# Patient Record
Sex: Female | Born: 1985 | Race: Black or African American | Hispanic: No | Marital: Married | Smoking: Current some day smoker
Health system: Southern US, Community
[De-identification: ages and names within clinical notes are randomized; demographics above are authoritative.]

## PROBLEM LIST (undated history)

## (undated) ENCOUNTER — Inpatient Hospital Stay (HOSPITAL_COMMUNITY): Payer: Self-pay

## (undated) DIAGNOSIS — A549 Gonococcal infection, unspecified: Secondary | ICD-10-CM

## (undated) DIAGNOSIS — A599 Trichomoniasis, unspecified: Secondary | ICD-10-CM

## (undated) DIAGNOSIS — F32A Depression, unspecified: Secondary | ICD-10-CM

## (undated) DIAGNOSIS — J189 Pneumonia, unspecified organism: Secondary | ICD-10-CM

## (undated) DIAGNOSIS — F329 Major depressive disorder, single episode, unspecified: Secondary | ICD-10-CM

## (undated) DIAGNOSIS — N390 Urinary tract infection, site not specified: Secondary | ICD-10-CM

## (undated) DIAGNOSIS — D649 Anemia, unspecified: Secondary | ICD-10-CM

## (undated) DIAGNOSIS — R51 Headache: Secondary | ICD-10-CM

## (undated) DIAGNOSIS — J45909 Unspecified asthma, uncomplicated: Secondary | ICD-10-CM

## (undated) HISTORY — PX: WISDOM TOOTH EXTRACTION: SHX21

---
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
## 2007-05-27 ENCOUNTER — Emergency Department (HOSPITAL_COMMUNITY): Admission: EM | Admit: 2007-05-27 | Discharge: 2007-05-27 | Payer: Self-pay | Admitting: Family Medicine

## 2007-07-05 ENCOUNTER — Emergency Department (HOSPITAL_COMMUNITY): Admission: EM | Admit: 2007-07-05 | Discharge: 2007-07-05 | Payer: Self-pay | Admitting: Emergency Medicine

## 2009-08-25 ENCOUNTER — Emergency Department (HOSPITAL_COMMUNITY): Admission: EM | Admit: 2009-08-25 | Discharge: 2009-08-25 | Payer: Self-pay | Admitting: Emergency Medicine

## 2010-02-24 ENCOUNTER — Emergency Department (HOSPITAL_COMMUNITY): Admission: EM | Admit: 2010-02-24 | Discharge: 2010-02-24 | Payer: Self-pay | Admitting: Family Medicine

## 2010-10-15 ENCOUNTER — Emergency Department (HOSPITAL_COMMUNITY)
Admission: EM | Admit: 2010-10-15 | Discharge: 2010-10-15 | Payer: Self-pay | Source: Home / Self Care | Admitting: Emergency Medicine

## 2011-01-08 LAB — POCT I-STAT, CHEM 8
BUN: 9 mg/dL (ref 6–23)
Calcium, Ion: 1.17 mmol/L (ref 1.12–1.32)
Chloride: 107 mEq/L (ref 96–112)
Creatinine, Ser: 0.7 mg/dL (ref 0.4–1.2)
Glucose, Bld: 88 mg/dL (ref 70–99)
HCT: 31 % — ABNORMAL LOW (ref 36.0–46.0)
Hemoglobin: 10.5 g/dL — ABNORMAL LOW (ref 12.0–15.0)
Potassium: 4.1 mEq/L (ref 3.5–5.1)
Sodium: 142 mEq/L (ref 135–145)
TCO2: 25 mmol/L (ref 0–100)

## 2011-01-08 LAB — POCT PREGNANCY, URINE: Preg Test, Ur: NEGATIVE

## 2011-01-16 LAB — URINE CULTURE: Colony Count: >=100000

## 2011-01-16 LAB — POCT URINALYSIS DIP (DEVICE)
Glucose, UA: NEGATIVE mg/dL
Ketones, ur: 80 mg/dL — AB
Nitrite: POSITIVE — AB
Protein, ur: >=300 mg/dL — AB
Specific Gravity, Urine: 1.025 (ref 1.005–1.030)
Urobilinogen, UA: 2 mg/dL — ABNORMAL HIGH (ref 0.0–1.0)
pH: 6 (ref 5.0–8.0)

## 2011-01-16 LAB — POCT PREGNANCY, URINE: Preg Test, Ur: NEGATIVE

## 2011-02-01 LAB — URINALYSIS, ROUTINE W REFLEX MICROSCOPIC
Bilirubin Urine: NEGATIVE
Glucose, UA: NEGATIVE mg/dL
Hgb urine dipstick: NEGATIVE
Ketones, ur: 15 mg/dL — AB
Nitrite: NEGATIVE
Protein, ur: NEGATIVE mg/dL
Specific Gravity, Urine: 1.029 (ref 1.005–1.030)
Urobilinogen, UA: 1 mg/dL (ref 0.0–1.0)
pH: 7.5 (ref 5.0–8.0)

## 2011-02-01 LAB — URINE MICROSCOPIC-ADD ON

## 2011-02-01 LAB — POCT PREGNANCY, URINE: Preg Test, Ur: NEGATIVE

## 2011-02-26 ENCOUNTER — Inpatient Hospital Stay (INDEPENDENT_AMBULATORY_CARE_PROVIDER_SITE_OTHER)
Admission: RE | Admit: 2011-02-26 | Discharge: 2011-02-26 | Disposition: A | Discharge status: Home / Self Care | Payer: Medicaid Other | Source: Ambulatory Visit | Attending: Emergency Medicine | Admitting: Emergency Medicine

## 2011-02-26 DIAGNOSIS — J111 Influenza due to unidentified influenza virus with other respiratory manifestations: Secondary | ICD-10-CM

## 2011-03-16 NOTE — H&P (Signed)
Claire Riley, Claire Riley              ACCOUNT NO.:  [id number]   MEDICAL RECORD NO.:  [id number]          PATIENT TYPE:  EMS   LOCATION:  MAJO                         FACILITY:  [facility]   PHYSICIAN:  Elliot Cousin, M.D.    DATE OF BIRTH:  1985-12-08   DATE OF ADMISSION:  06/15/2006  DATE OF DISCHARGE:                                HISTORY & PHYSICAL   CHIEF COMPLAINT:  Pleuritic right sided chest pain and shortness of breath.   HISTORY OF PRESENT ILLNESS:  The patient is an 25 year old African American  lady with a past medical history significant for a recent delivery on June 03, 2006, and asthma, who presents to the emergency department with a 4-5 day  history of pleuritic right sided chest pain, shortness of breath, and  subjective fever and chills.  She rates the pain as a 7 to 8 over 10.  It  worsens with deep inspiration.  She took a combination of Tylenol and  ibuprofen to help with the pain.  They helped only a little. She went to her  primary care physician yesterday and was prescribed an antibiotic.  She has  had an intermittent productive cough with yellow and green sputum.  She says  her cough has been wet without expectoration.  She says that her temperature  reached as high as 104.7 at home yesterday.  She has had chills  intermittently over the past two days.  She also has an associated headache  globally but no neck stiffness, photophobia, or focal weakness.  No  associated history of nausea, vomiting, abdominal pain, diarrhea, or painful  urination.  She has had some mild vaginal spotting which she attributes to  her recent pregnancy and delivery.  She does not recall being exposed to  anyone with TB or pneumonia.  Of note, her infant son was hospitalized, as  well, for a breathing problem.   During the evaluation in the emergency department by emergency department  physician, Dr. Lynelle Doctor, the patient was noted to be afebrile and  hemodynamically stable.  She was,   however, mildly tachycardic.  The chest x-  ray revealed right lower lobe pneumonia.  A CT scan of the chest was ordered  to rule out PE primarily.  The CT scan of the chest revealed right lower  lobe pneumonia with a foci of necrosis and central cavitation.  It also  revealed other scattered nodules, no central PE, right hilar adenopathy,  mild bilateral supraclavicular and possible upper abdominal adenopathy.  The  patient will, therefore, be admitted for further evaluation and management.   PAST MEDICAL HISTORY:  1. Status post vaginal delivery on June 03, 2006.  The pregnancy was      uncomplicated per the patient, however, she did have to  undergo a      repair of the right sulcus tear and placement of a Penrose drain      following delivery.  2. Asthma.   MEDICATIONS:  1. Tylenol as needed.  2. Ibuprofen as needed.  3. Multi-vitamin with iron once daily.  4. Albuterol MDI 2 puffs as  needed.  5. Antibiotic (patient could not recall the name).   ALLERGIES:  No known drug allergies.   SOCIAL HISTORY:  The patient is single.  [place]  She has one child recently delivered.  She is  currently in the 11th grade.  She denies tobacco, alcohol, or drug use.   FAMILY HISTORY:  Her mother is 46 years of age and has hypertension.  Her  father is 93 years of age and has bronchitis.   REVIEW OF SYMPTOMS:  The patient's review of systems is as above in the  history of present illness.  In addition, she has had occasional mild  vaginal bleeding.   PHYSICAL EXAMINATION:  VITAL SIGNS:  Temperature 98.5, blood pressure 127/72, pulse 116,  respiratory rate 28, oxygen saturation 100% on 2 liters.  GENERAL:  The patient is a pleasant, obese 25 year old [place] American  woman who is currently sitting up in bed;  she does appear mildly dyspneic.  HEENT:  Head is normocephalic, atraumatic.  Pupils equal, round, reactive to  light.  Extraocular  movements intact.  Conjunctivae clear.  Sclerae white.  Tympanic membranes are clear bilaterally.  Nasal mucosa is mildly dry.  No  sinus tenderness.  Oropharynx reveals fair dentition.  Mucous membranes  mildly dry.  No posterior exudates or erythema.  NECK:  Supple, no adenopathy, no thyromegaly, no JVD, no bruits.  LUNGS:  Diffusely decreased breath sounds in the bases, particularly on the  right.  Occasional crackles of the right greater than left mid lobe.  The  patient is splinting.  She is mildly dyspneic when speaking.  She is not,  however, using  accessory muscles in excess.  HEART:  S1 and S2, tachycardia.  ABDOMEN:  Positive bowel sounds, soft, nontender, nondistended, no  hepatosplenomegaly, no masses palpated.  EXTREMITIES:  Pedal pulses are 2+ bilaterally.  No pretibial edema.  No  pedal edema.  No acute joint findings.  SKIN:  No obvious rash.  Turgor fairly good.   ADMISSION LABORATORY DATA:  EKG revealed sinus tachycardia with a heart rate  of 121 beats per minute.  Chest x-ray and CT of the chest findings are above  in the history of present illness.  Sodium 138, potassium 3.9, chloride 109,  BUN 10, glucose 107, bicarbonate 20, creatinine 0.7.  Myoglobin 45.8, CK MB  1.2, troponin I less than 0.05.  PT 13.7, INR 1.  WBC 11.5, hemoglobin 9.7,  hematocrit 30.1, MCV 71.6, platelets 310.   ASSESSMENT:  1. Right lower lobe pneumonia with evidence of focal necrosis/central      cavitation and surrounding adenopathy.  The etiology of the necrosis      and central cavitation is unclear.  Certainly tuberculosis and      sarcoidosis come to mind.  Given the patient's age, it appears to be      less likely tuberculosis, although it will certainly need to be ruled      out.  2. Status post pregnancy and vaginal delivery June 03, 2006.  3. Microcytic anemia.  This anemia is not unusual in an 25 year old     menstruating woman who was recently pregnant.  Hemoglobin 9.7 and  MCV      is 7.16.  4. Sinus tachycardia.  The tachycardia is secondary to volume depletion      and pneumonia.  5. History of asthma.  The patient uses Albuterol only occasionally.   PLAN:  1. The  patient will be admitted for further evaluation and management.  2. Blood cultures have been ordered.  The patient also received a gram of      Rocephin intravenously.  3. Will continue antibiotic treatment with Zosyn and azithromycin      intravenously.  Symptomatic treatment will be added with Albuterol and      Atrovent nebulizers.  Mucinex and Robitussin DM will be added, as well.  4. Will treat the patient's pleuritic pain with as needed Tylenol,      Oxycodone, and Dilaudid.  5. Will place the patient on respiratory isolation for now.  Will collect      sputums for gram stain and culture as well as for acid fast bacilli.  6. Consult pulmonary.  7. Will follow up on labs in the morning including iron studies,      angiotensin converting enzyme level, urine Legionella antigen, and a      TSH.  Will also placed a PPD.      Elliot Cousin, M.D.  Electronically Signed     DF/MEDQ  D:  06/15/2006  T:  06/15/2006  Job:  [id number]

## 2011-03-16 NOTE — Op Note (Signed)
[name], Claire Riley              ACCOUNT NO.:  [id number]   MEDICAL RECORD NO.:  [id number]          PATIENT TYPE:  INP   LOCATION:  9114                          FACILITY:  [facility]   PHYSICIAN:  Lesly Dukes, M.D. DATE OF BIRTH:  26-May-1986   DATE OF PROCEDURE:  06/03/2006  DATE OF DISCHARGE:                                 OPERATIVE REPORT   PREOPERATIVE DIAGNOSIS:  An 25 year old female with high right sulcus tear.   POSTOPERATIVE DIAGNOSIS:  An 25 year old female with high right sulcus tear.   PROCEDURE:  Repair of right sulcus tear and placement of Penrose drain.   SURGEON:  Dr. Elsie Lincoln.   ANESTHESIA:  General.   ESTIMATED BLOOD LOSS:  200 cc.   COMPLICATIONS:  None.   PROCEDURE:  After informed consent was obtained, patient was taken to the  operating room where general anesthesia was induced.  The patient was placed  in dorsal lithotomy position.  Foley was placed into the bladder.  Patient  was prepared and draped in normal sterile fashion.  Retractor was placed  into the vagina and there was good hemostasis up in the sulcus, except for  the vaginal epithelium with bleeding.  A 2-0 Vicryl was used to repair the  vaginal epithelium and a Penrose drain was placed laterally in the  paravaginal space.  This was placed in case the patient started to drain in  the paravaginal space.  The drain will be removed tomorrow.  Retractor was  removed from the vagina.  There was a small right labial tear that was  hemostatic and this was not repaired.  The patient tolerated the procedure  well.  Sponge, lap, instrument and needle count were correct times two.  The  patient to recovery room in stable condition.           ______________________________  Lesly Dukes, M.D.     KHL/MEDQ  D:  06/03/2006  T:  06/03/2006  Job:  [id number]

## 2011-03-16 NOTE — Discharge Summary (Signed)
NAMEAUBRIEE, SZETO              ACCOUNT NO.:  [id number]   MEDICAL RECORD NO.:  [id number]          PATIENT TYPE:  INP   LOCATION:  6702                         FACILITY:  [facility]   PHYSICIAN:  Lonia Blood, M.D.       DATE OF BIRTH:  05-Dec-1985   DATE OF ADMISSION:  06/15/2006  DATE OF DISCHARGE:  06/22/2006                                 DISCHARGE SUMMARY   PRIMARY CARE PHYSICIAN:  [facility]   DISCHARGE DIAGNOSES:  1. Right lower lobe cavitary pneumonia.  2. Recent vaginal delivery  3. Obesity.  4. Mild bronchial asthma   DISCHARGE MEDICATIONS:  1. Augmentin 175 mg by mouth twice a day for 2 weeks.  2. Tylenol as needed for pain.  3. Multivitamin one daily.   CONDITION ON DISCHARGE:  The patient was discharged in good condition. At  time of discharge, she was alert, oriented, afebrile with stable vital  signs, ambulating and tolerating a regular diet.  At the time of discharge,  the patient was instructed to follow up with the primary care physician at  the [facility] and to follow up with Dr. Levy Pupa with  [facility] for possible repeat CT scan of a chest to assure  resolution of her cavitary pneumonia.  Also, the patient was instructed that  in case she develops hemoptysis or fever, to report promptly back to the  emergency room.   CONSULTATION DURING THIS ADMISSION:  1. The patient was seen in consultation by Dr. Delton Coombes for pulmonary.  2. Dr. Wayland Salinas with infectious disease.   PROCEDURES DURING THIS ADMISSION:  1. June 15, 2006, the patient underwent computed tomography scan of the      chest that showed no pulmonary embolism, right lower lobe necrotic      pneumonia, superior segment right lower lobe and left lower lobe      nodular densities, right hilar mediastinal and bilateral suprapubic      clavicular adenopathy, small pleural effusions.  2. June 19, 2006, bronchoscopy by Dr. Delton Coombes showing no  identifiable      mass, anomalous distal right upper lobe takeoff, right upper lobe      bronchus at the level of the right middle which is a normal anatomical      variant.  3. Bronchoalveolar lavage was performed. Cultures are still pending.  4. June 19, 2006, computed tomography scan of abdomen and pelvis which      did not show any thrombosed pelvic veins.  5. Transthoracic echocardiogram which showed normal ejection fraction and      increased pulmonary artery pressures.   For admission History and Physical admission, refer to the dictated H&P done  on June 15, 2006, by Dr. Sherrie Mustache.  Briefly, Mrs. Borgwardt is an 25 year old  African American woman who presented to [facility] Emergency Room with  fever, chills and pain with deep inspiration.  She was diagnosed with  necrotizing cavitary pneumonia.  She was admitted to the hospital for  further workup and observation.   HOSPITAL COURSE.:  RIGHT LOWER LOBE CAVITARY  PNEUMONIA:  Mrs. Boeve was admitted to the  regular floor of [facility], and she was promptly started on  empiric antibiotics using vancomycin and Zosyn.  The patient had blood  cultures at the time of admission.  Her blood cultures remained negative.  The patient never produced any sputum, so we were not able to perform a  sputum culture.  The patient improved dramatically with empiric antibiotics,  but, because her pneumonia had atypical features, we have proceeded with a  bronchoalveolar lavage to look for AFB or fungi.  At the time of discharge,  all the cultures are pending, but none of them are growing anything at this  point in time.  Other possible etiologies that we entertained include  Wegener granulomatosis, but the ANCA panel was negative.  At this point in  time, since the patient had dramatic good response to antibiotics, and after  careful consultation with infectious disease specialist and the pulmonary  specialist, we have elected to proceed  with outpatient oral antibiotic  treatment for 2 weeks and then to repeat the CT scan of the chest to assure  resolution of the necrotic elements.  If the patient has worsening in her  status, she was instructed to come back immediately to the emergency room  for change in plans.  If the patient does not have a good response to oral  antibiotics at home, we were contemplating a video-assisted thoracoscopy  with removal of the necrotic mass.  This procedure was actually offered to  the patient and her family even before discharge, but they politely  declined.  At the time of discharge of the patient, the choice of  antibiotics was also dictated by the fact the patient wishes to be able to  breast feed her child.  The choice of antibiotic has been done in careful  consultation with Dr. Roxan Hockey from infectious disease.      Lonia Blood, M.D.  Electronically Signed     SL/MEDQ  D:  06/22/2006  T:  06/23/2006  Job:  [id number]   cc:   Leslye Peer, MD  [facility]

## 2011-03-16 NOTE — Op Note (Signed)
Claire Riley, Claire Riley              ACCOUNT NO.:  [id number]   MEDICAL RECORD NO.:  [id number]          PATIENT TYPE:  INP   LOCATION:  1610                         FACILITY:  [facility]   PHYSICIAN:  Leslye Peer, MD    DATE OF BIRTH:  1986-01-21   DATE OF PROCEDURE:  06/19/2006  DATE OF DISCHARGE:                                 OPERATIVE REPORT   PROCEDURE:  Fiberoptic bronchoscopy with bronchoalveolar lavage and  transbronchial biopsies.   OPERATOR:  Dr. Levy Pupa.   INDICATION:  Right lower lobe cavitary lesions.  Consent was obtained from  the patient and signed copy is on her hospital chart.   MEDICATIONS GIVEN:  1. Fentanyl 150 mcg IV in divided doses.  2. Versed 6 mg IV in divided doses.  3. 1% lidocaine to the bronchial tree, 40 mL total.  4. Cetacaine spray to the posterior pharynx.   PROCEDURE DETAILS:  Informed consent was obtained from the patient, as  indicated above, and then conscious sedation was initiated, as indicated  above.  The fiberoptic bronchoscope was initially introduced through the  right nares, but due to patient discomfort, was withdrawn and then  introduced through the oropharynx.  The cords were visualized and were  normal in appearance.  The trachea was intubated and local anesthesia was  achieved with 1% topical lidocaine.  The main carina was within normal  limits.  There was an anomalous distal right upper lobe takeoff and right  upper lobe bronchus at the level of the right middle lobe, which is a normal  anatomical variant.  There were no obvious endobronchial lesions or  secretions on a full right-sided exam.  The bronchoscope was withdrawn and  the left side was inspected.  The anatomy on the left was entirely normal  without any evidence of mass or secretions.  Transbronchial biopsies (N=7)  were obtained, under fluoroscopic guidance, from all segments of the right  lower lobe.  Bronchoalveolar lavage was then performed with 60 mL  normal  saline instilled and 10 mL returned.  The right lower lobe bronchoalveolar  lavage will be sent for microbiology and cytology.  The patient tolerated  the procedure well.  There was approximately 2-3 mL total blood loss and  there was good hemostasis at the end of procedure.  Postprocedural chest x-  ray is pending.  The patient returned to recovery room in good condition.   SAMPLES:  1. Transbronchial biopsies from the right lower lobe.  2. Bronchoalveolar lavage from the right lower lobe.   PLANS:  We will follow up Ms. Postell' pathology an microbiology results and  adjust our therapeutic plans accordingly.          ______________________________  Leslye Peer, MD    RSB/MEDQ  D:  06/19/2006  T:  06/19/2006  Job:  [id number]

## 2011-08-10 LAB — POCT RAPID STREP A: Streptococcus, Group A Screen (Direct): POSITIVE — AB

## 2011-08-18 ENCOUNTER — Encounter (HOSPITAL_COMMUNITY): Payer: Self-pay | Admitting: *Deleted

## 2011-08-18 ENCOUNTER — Inpatient Hospital Stay (HOSPITAL_COMMUNITY)
Admission: AD | Admit: 2011-08-18 | Discharge: 2011-08-19 | Disposition: A | Discharge status: Home / Self Care | Payer: Medicaid Other | Source: Ambulatory Visit | Attending: Obstetrics & Gynecology | Admitting: Obstetrics & Gynecology

## 2011-08-18 DIAGNOSIS — N946 Dysmenorrhea, unspecified: Secondary | ICD-10-CM

## 2011-08-18 DIAGNOSIS — M5380 Other specified dorsopathies, site unspecified: Secondary | ICD-10-CM

## 2011-08-18 DIAGNOSIS — M62830 Muscle spasm of back: Secondary | ICD-10-CM

## 2011-08-18 HISTORY — DX: Unspecified asthma, uncomplicated: J45.909

## 2011-08-18 HISTORY — DX: Urinary tract infection, site not specified: N39.0

## 2011-08-18 HISTORY — DX: Gonococcal infection, unspecified: A54.9

## 2011-08-18 LAB — CBC
HCT: 33.4 % — ABNORMAL LOW (ref 36.0–46.0)
MCV: 76.4 fL — ABNORMAL LOW (ref 78.0–100.0)
Platelets: 216 10*3/uL (ref 150–400)
RBC: 4.37 MIL/uL (ref 3.87–5.11)
RDW: 15.7 % — ABNORMAL HIGH (ref 11.5–15.5)
WBC: 6.7 10*3/uL (ref 4.0–10.5)

## 2011-08-18 LAB — COMPREHENSIVE METABOLIC PANEL
ALT: 8 U/L (ref 0–35)
AST: 12 U/L (ref 0–37)
Albumin: 4.1 g/dL (ref 3.5–5.2)
Alkaline Phosphatase: 60 U/L (ref 39–117)
CO2: 25 mEq/L (ref 19–32)
Chloride: 104 mEq/L (ref 96–112)
Creatinine, Ser: 0.74 mg/dL (ref 0.50–1.10)
GFR calc non Af Amer: >90 mL/min (ref >90)
Potassium: 3.6 mEq/L (ref 3.5–5.1)
Total Bilirubin: 0.3 mg/dL (ref 0.3–1.2)

## 2011-08-18 LAB — URINALYSIS, ROUTINE W REFLEX MICROSCOPIC
Glucose, UA: NEGATIVE mg/dL
Ketones, ur: 40 mg/dL — AB
Leukocytes, UA: NEGATIVE
Nitrite: NEGATIVE
Protein, ur: NEGATIVE mg/dL
Specific Gravity, Urine: >1.03 — ABNORMAL HIGH (ref 1.005–1.030)
Urobilinogen, UA: 4 mg/dL — ABNORMAL HIGH (ref 0.0–1.0)
pH: 6 (ref 5.0–8.0)

## 2011-08-18 LAB — URINE MICROSCOPIC-ADD ON

## 2011-08-18 LAB — POCT PREGNANCY, URINE: Preg Test, Ur: NEGATIVE

## 2011-08-18 MED ORDER — KETOROLAC TROMETHAMINE 60 MG/2ML IM SOLN
60.0000 mg | Freq: Once | INTRAMUSCULAR | Status: AC
  Administered 2011-08-18: 60 mg via INTRAMUSCULAR
  Filled 2011-08-18: qty 2

## 2011-08-18 NOTE — Progress Notes (Signed)
Pt states, " I 've had left mid and low back pain for two and then today I started having heavy vaginal bleeding. I filled up a large pad in 45 min. I've had low abdominal pain off and on for one month and it has been there all day."

## 2011-08-18 NOTE — ED Provider Notes (Signed)
History     Chief Complaint  Patient presents with  . Back Pain  . Vaginal Bleeding   HPI Claire Riley 25 y.o. Not pregnant.  Has had left sided back pain for 3 days which begins below shoulder blade and shoots down her back and radiates to right leg.  Has had heavy vaginal bleeding today and lower abdominal cramping.  Has 101 year old child that weighs 42 pounds and she carried her child today when walking to a relative's house.   OB History    Grav Para Term Preterm Abortions TAB SAB Ect Mult Living   1 1        1       Past Medical History  Diagnosis Date  . UTI (lower urinary tract infection)   . Bronchial asthma   . Gonorrhea     Past Surgical History  Procedure Date  . Wisdom tooth extraction     No family history on file.  History  Substance Use Topics  . Smoking status: Current Everyday Smoker -- 0.2 packs/day  . Smokeless tobacco: Not on file  . Alcohol Use: No    Allergies:  Allergies  Allergen Reactions  . Tomato Hives    Prescriptions prior to admission  Medication Sig Dispense Refill  . ibuprofen (ADVIL,MOTRIN) 200 MG tablet Take 200 mg by mouth every 6 (six) hours as needed. For pain         Review of Systems  Gastrointestinal: Positive for abdominal pain.  Genitourinary:       Vaginal bleeding  Musculoskeletal: Positive for back pain.   Physical Exam   Blood pressure 129/88, pulse 99, temperature 99.2 F (37.3 C), temperature source Oral, resp. rate 18, height 5\' 11"  (1.803 m), weight 195 lb 4 oz (88.565 kg), last menstrual period 06/22/2011.  Physical Exam  Nursing note and vitals reviewed. Constitutional: She is oriented to person, place, and time. She appears well-developed and well-nourished.  HENT:  Head: Normocephalic.  Eyes: EOM are normal.  Neck: Neck supple.  GI: Soft. There is no tenderness. There is no rebound and no guarding.  Musculoskeletal: Normal range of motion.  Neurological: She is alert and oriented to person,  place, and time.  Skin: Skin is warm and dry.  Psychiatric: She has a normal mood and affect.    MAU Course  Procedures Toradol 60 MG IM and client is now sleeping.  Awakened her for discharge.   MDM Results for orders placed during the hospital encounter of 08/18/11 (from the past 24 hour(s))  URINALYSIS, ROUTINE W REFLEX MICROSCOPIC     Status: Abnormal   Collection Time   08/18/11 10:30 PM      Component Value Range   Color, Urine YELLOW  YELLOW    Appearance CLEAR  CLEAR    Specific Gravity, Urine >1.030 (*) 1.005 - 1.030    pH 6.0  5.0 - 8.0    Glucose, UA NEGATIVE  NEGATIVE (mg/dL)   Hgb urine dipstick LARGE (*) NEGATIVE    Bilirubin Urine SMALL (*) NEGATIVE    Ketones, ur 40 (*) NEGATIVE (mg/dL)   Protein, ur NEGATIVE  NEGATIVE (mg/dL)   Urobilinogen, UA 4.0 (*) 0.0 - 1.0 (mg/dL)   Nitrite NEGATIVE  NEGATIVE    Leukocytes, UA NEGATIVE  NEGATIVE   URINE MICROSCOPIC-ADD ON     Status: Abnormal   Collection Time   08/18/11 10:30 PM      Component Value Range   Squamous Epithelial / LPF FEW (*)  RARE    WBC, UA 0-2  <3 (WBC/hpf)   RBC / HPF TOO NUMEROUS TO COUNT  <3 (RBC/hpf)   Bacteria, UA FEW (*) RARE    Urine-Other MUCOUS PRESENT    POCT PREGNANCY, URINE     Status: Normal   Collection Time   08/18/11 10:35 PM      Component Value Range   Preg Test, Ur NEGATIVE    CBC     Status: Abnormal   Collection Time   08/18/11 10:56 PM      Component Value Range   WBC 6.7  4.0 - 10.5 (K/uL)   RBC 4.37  3.87 - 5.11 (MIL/uL)   Hemoglobin 10.3 (*) 12.0 - 15.0 (g/dL)   HCT 40.9 (*) 81.1 - 46.0 (%)   MCV 76.4 (*) 78.0 - 100.0 (fL)   MCH 23.6 (*) 26.0 - 34.0 (pg)   MCHC 30.8  30.0 - 36.0 (g/dL)   RDW 91.4 (*) 78.2 - 15.5 (%)   Platelets 216  150 - 400 (K/uL)  COMPREHENSIVE METABOLIC PANEL     Status: Abnormal   Collection Time   08/18/11 10:56 PM      Component Value Range   Sodium 138  135 - 145 (mEq/L)   Potassium 3.6  3.5 - 5.1 (mEq/L)   Chloride 104  96 - 112  (mEq/L)   CO2 25  19 - 32 (mEq/L)   Glucose, Bld 100 (*) 70 - 99 (mg/dL)   BUN 12  6 - 23 (mg/dL)   Creatinine, Ser 9.56  0.50 - 1.10 (mg/dL)   Calcium 9.6  8.4 - 21.3 (mg/dL)   Total Protein 7.1  6.0 - 8.3 (g/dL)   Albumin 4.1  3.5 - 5.2 (g/dL)   AST 12  0 - 37 (U/L)   ALT 8  0 - 35 (U/L)   Alkaline Phosphatase 60  39 - 117 (U/L)   Total Bilirubin 0.3  0.3 - 1.2 (mg/dL)   GFR calc non Af Amer >90  >90 (mL/min)   GFR calc Af Amer >90  >90 (mL/min)     Assessment and Plan  Dysmenorrhea Back muscle spasm  Plan: Will prescribe Ibuprofen 800 mg po q 8 hours for pain Will prescribe flexeril for back spasms (has taken previously) Advised not to carry her child. [facility]. Take a multivitamin daily.  BURLESON,TERRI 08/18/2011, 11:03 PM   Nolene Bernheim, NP 08/19/11 0050

## 2011-08-18 NOTE — Progress Notes (Signed)
Pt states started bleeding heavily (1 large pad in 45 min), bleeding has slowed now.  C/o pain in lower back and abd.

## 2011-08-19 MED ORDER — CYCLOBENZAPRINE HCL 10 MG PO TABS: 10.0000 mg | ORAL_TABLET | Freq: Three times a day (TID) | ORAL | Status: AC | PRN

## 2011-08-19 MED ORDER — IBUPROFEN 800 MG PO TABS: 800.0000 mg | ORAL_TABLET | Freq: Three times a day (TID) | ORAL | Status: AC

## 2011-08-19 NOTE — ED Notes (Signed)
0045 T. Burleson NP in to see pt and discuss d/c plan.

## 2011-08-19 NOTE — Progress Notes (Signed)
Written and verbal d/c instructions given and understanding voiced. 

## 2011-08-19 NOTE — Progress Notes (Signed)
Pt denies any pain and is dozing

## 2011-08-21 NOTE — ED Provider Notes (Signed)
Attestation of Attending Supervision of Advanced Practitioner: Evaluation and management procedures were performed by the PA/NP/CNM/OB Fellow under my supervision/collaboration. Chart reviewed and agree with management and plan.  [name] A M.D. 08/21/2011 11:17 AM

## 2012-03-08 ENCOUNTER — Emergency Department (HOSPITAL_COMMUNITY)
Admission: EM | Admit: 2012-03-08 | Discharge: 2012-03-08 | Disposition: A | Discharge status: Home / Self Care | Payer: Medicaid Other | Source: Home / Self Care | Attending: Emergency Medicine | Admitting: Emergency Medicine

## 2012-03-08 ENCOUNTER — Encounter (HOSPITAL_COMMUNITY): Payer: Self-pay | Admitting: *Deleted

## 2012-03-08 DIAGNOSIS — M5416 Radiculopathy, lumbar region: Secondary | ICD-10-CM

## 2012-03-08 DIAGNOSIS — IMO0002 Reserved for concepts with insufficient information to code with codable children: Secondary | ICD-10-CM

## 2012-03-08 MED ORDER — MELOXICAM 15 MG PO TABS: 15.0000 mg | ORAL_TABLET | Freq: Every day | ORAL | Status: DC

## 2012-03-08 MED ORDER — CYCLOBENZAPRINE HCL 5 MG PO TABS: 5.0000 mg | ORAL_TABLET | Freq: Three times a day (TID) | ORAL | Status: AC | PRN

## 2012-03-08 MED ORDER — PREDNISONE 10 MG PO TABS: ORAL_TABLET | ORAL | Status: DC

## 2012-03-08 NOTE — Discharge Instructions (Signed)
Do exercises twice daily followed by moist heat for 15 minutes. ° ° ° ° ° °Try to be as active as possible. ° °If no better in 2 weeks, follow up with orthopedist. ° ° °

## 2012-03-08 NOTE — ED Provider Notes (Signed)
Chief Complaint  Patient presents with  . Leg Swelling    History of Present Illness:  The patient is a 26 year old female who has had a two-day history of right leg swelling and pain. The swelling is localized in the knee down to the ankle and the pain in the groin on down to the ankle. This came on after playing basketball than going to work at the [employer] the same evening. She also complains of numbness on the right inner thigh and posterior right ankle and heel. She denies any weakness, bladder, or bowel complaints. She has no swelling or pain in the left leg. She denies any abdominal pain, fever, or chills. She's had no shortness of breath, chest pain, or hemoptysis. No prior history of DVT or pulmonary emboli, or any other clotting problems either in herself or her family. No history of prolonged car trips or plane trips or trauma to the leg.  Review of Systems:  Other than noted above, the patient denies any of the following symptoms: Systemic:  No fever, chills, sweats, weight gain or loss. Respiratory:  No coughing, wheezing, or shortness of breath. Cardiac:  No chest pain, tightness, pressure or syncope. GI:  No abdominal pain, swelling, distension, nausea, or vomiting. GU:  No dysuria, frequency, or hematuria. Ext:  No joint pain, muscle pain, or weakness. Skin:  No rash or itching. Neuro:  No paresthesias.   PMFSH:  Past medical history, family history, social history, meds, and allergies were reviewed.  Physical Exam:   Vital signs:  BP 140/81  Pulse 94  Temp(Src) 99 F (37.2 C) (Oral)  Resp 18  SpO2 99%  LMP 02/12/2012 Gen:  Alert, oriented, in no distress. Neck:  No tenderness, adenopathy, or JVD. Lungs:  Breath sounds clear and equal bilaterally.  No rales, rhonchi or wheezes. Heart:  Regular rhythm, no gallops or murmers. Abdomen:  Soft, nontender, no organomegaly or mass. Ext:  There was no visible swelling of the right leg, no pitting edema, and calf  circumference was 44 cm bilaterally. She has pain to palpation over the entire leg from the groin area on down to the foot. All joints have full range of motion but she does have pain with movement of the hip, knee, and ankle. DTRs and muscle strength normal, sensation was normal, pulses were full, she has good capillary refill. Neuro:  Alert and oriented times 3.  No muscle weakness.  Sensation intact to light touch. Skin:  Warm and dry.  No rash or skin lesions.  Labs:   Results for orders placed during the hospital encounter of 03/08/12  D-DIMER, QUANTITATIVE      Component Value Range   D-Dimer, Quant 0.26  0.00 - 0.48 (ug/mL-FEU)     Assessment:  The encounter diagnosis was Lumbar radiculopathy. I think all of her symptoms are musculoskeletal in most likely due to overexertion. I gave her a note to remain out of work for about 3 days as well as the medications listed below.  Plan:   1.  The following meds were prescribed:   New Prescriptions   CYCLOBENZAPRINE (FLEXERIL) 5 MG TABLET    Take 1 tablet (5 mg total) by mouth 3 (three) times daily as needed for muscle spasms.   MELOXICAM (MOBIC) 15 MG TABLET    Take 1 tablet (15 mg total) by mouth daily.   PREDNISONE (DELTASONE) 10 MG TABLET    Take 4 tabs daily for 4 days, 3 tabs daily for 4 days,  2 tabs daily for 4 days, then 1 tab daily for 4 days.   2.  The patient was instructed in symptomatic care and handouts were given. 3.  The patient was told to return if becoming worse in any way, if no better in 3 or 4 days, and given some red flag symptoms that would indicate earlier return. If no better within 2 weeks I suggested she followup with Dr. Doneen Poisson.    Reuben Likes, MD 03/08/12 [phone]

## 2012-03-08 NOTE — ED Notes (Signed)
Reports playing basketball last Saturday, then going to work at [employer] the same evening; started to notice swelling and pain from right hip down to right ankle.  Continues w/ pain and swelling.  Also c/o numbness to right inner thigh and posterior right ankle/heel.  Has tried IBU 400mg , hot baths, and ice.

## 2012-06-17 ENCOUNTER — Inpatient Hospital Stay (HOSPITAL_COMMUNITY)
Admission: EM | Admit: 2012-06-17 | Discharge: 2012-06-19 | DRG: 690 | Disposition: A | Discharge status: Home / Self Care | Payer: MEDICAID | Attending: Internal Medicine | Admitting: Internal Medicine

## 2012-06-17 ENCOUNTER — Emergency Department (HOSPITAL_COMMUNITY): Payer: Self-pay

## 2012-06-17 ENCOUNTER — Encounter (HOSPITAL_COMMUNITY): Payer: Self-pay | Admitting: *Deleted

## 2012-06-17 DIAGNOSIS — R509 Fever, unspecified: Secondary | ICD-10-CM

## 2012-06-17 DIAGNOSIS — F17200 Nicotine dependence, unspecified, uncomplicated: Secondary | ICD-10-CM | POA: Diagnosis present

## 2012-06-17 DIAGNOSIS — R000 Tachycardia, unspecified: Secondary | ICD-10-CM | POA: Diagnosis present

## 2012-06-17 DIAGNOSIS — A498 Other bacterial infections of unspecified site: Secondary | ICD-10-CM | POA: Diagnosis present

## 2012-06-17 DIAGNOSIS — J40 Bronchitis, not specified as acute or chronic: Secondary | ICD-10-CM

## 2012-06-17 DIAGNOSIS — N390 Urinary tract infection, site not specified: Principal | ICD-10-CM | POA: Diagnosis present

## 2012-06-17 DIAGNOSIS — I959 Hypotension, unspecified: Secondary | ICD-10-CM | POA: Diagnosis present

## 2012-06-17 DIAGNOSIS — M546 Pain in thoracic spine: Secondary | ICD-10-CM

## 2012-06-17 DIAGNOSIS — J45909 Unspecified asthma, uncomplicated: Secondary | ICD-10-CM | POA: Diagnosis present

## 2012-06-17 HISTORY — DX: Pneumonia, unspecified organism: J18.9

## 2012-06-17 LAB — URINALYSIS, ROUTINE W REFLEX MICROSCOPIC
Bilirubin Urine: NEGATIVE
Glucose, UA: NEGATIVE mg/dL
Ketones, ur: 15 mg/dL — AB
Protein, ur: >300 mg/dL — AB
pH: 6.5 (ref 5.0–8.0)

## 2012-06-17 LAB — URINE MICROSCOPIC-ADD ON

## 2012-06-17 LAB — CBC WITH DIFFERENTIAL/PLATELET
Eosinophils Absolute: 0 10*3/uL (ref 0.0–0.7)
Hemoglobin: 11.7 g/dL — ABNORMAL LOW (ref 12.0–15.0)
Lymphs Abs: 0.5 10*3/uL — ABNORMAL LOW (ref 0.7–4.0)
MCH: 27.2 pg (ref 26.0–34.0)
Neutro Abs: 5.7 10*3/uL (ref 1.7–7.7)
Neutrophils Relative %: 85 % — ABNORMAL HIGH (ref 43–77)
Platelets: 140 10*3/uL — ABNORMAL LOW (ref 150–400)
RBC: 4.3 MIL/uL (ref 3.87–5.11)
WBC: 6.7 10*3/uL (ref 4.0–10.5)

## 2012-06-17 LAB — POCT I-STAT, CHEM 8
Calcium, Ion: 1.18 mmol/L (ref 1.12–1.23)
Chloride: 103 mEq/L (ref 96–112)
Creatinine, Ser: 0.9 mg/dL (ref 0.50–1.10)
Glucose, Bld: 100 mg/dL — ABNORMAL HIGH (ref 70–99)
Hemoglobin: 11.9 g/dL — ABNORMAL LOW (ref 12.0–15.0)
Potassium: 3.5 mEq/L (ref 3.5–5.1)

## 2012-06-17 LAB — CARDIAC PANEL(CRET KIN+CKTOT+MB+TROPI)
Total CK: 68 U/L (ref 7–177)
Troponin I: <0.3 ng/mL (ref <0.30)

## 2012-06-17 MED ORDER — SODIUM CHLORIDE 0.9 % IV BOLUS (SEPSIS)
1000.0000 mL | Freq: Once | INTRAVENOUS | Status: AC
  Administered 2012-06-17: 1000 mL via INTRAVENOUS

## 2012-06-17 MED ORDER — DEXTROSE 5 % IV SOLN
1.0000 g | Freq: Once | INTRAVENOUS | Status: AC
  Administered 2012-06-17: 1 g via INTRAVENOUS
  Filled 2012-06-17: qty 10

## 2012-06-17 MED ORDER — ACETAMINOPHEN 325 MG PO TABS
975.0000 mg | ORAL_TABLET | Freq: Once | ORAL | Status: AC
  Administered 2012-06-17: 975 mg via ORAL
  Filled 2012-06-17: qty 3

## 2012-06-17 MED ORDER — SODIUM CHLORIDE 0.9 % IV SOLN
INTRAVENOUS | Status: DC
  Administered 2012-06-17 – 2012-06-19 (×4): via INTRAVENOUS

## 2012-06-17 MED ORDER — MORPHINE SULFATE 4 MG/ML IJ SOLN
4.0000 mg | Freq: Once | INTRAMUSCULAR | Status: AC
  Administered 2012-06-17: 4 mg via INTRAVENOUS
  Filled 2012-06-17: qty 1

## 2012-06-17 MED ORDER — ONDANSETRON HCL 4 MG/2ML IJ SOLN
4.0000 mg | Freq: Four times a day (QID) | INTRAMUSCULAR | Status: DC | PRN
Start: 2012-06-17 — End: 2012-06-19

## 2012-06-17 MED ORDER — SODIUM CHLORIDE 0.9 % IJ SOLN: 3.0000 mL | Freq: Two times a day (BID) | INTRAMUSCULAR | Status: DC

## 2012-06-17 MED ORDER — CEFTRIAXONE SODIUM 1 G IJ SOLR
1.0000 g | INTRAMUSCULAR | Status: DC
  Administered 2012-06-18 (×2): 1 g via INTRAVENOUS
  Filled 2012-06-17 (×4): qty 10

## 2012-06-17 MED ORDER — ONDANSETRON HCL 4 MG/2ML IJ SOLN
4.0000 mg | Freq: Once | INTRAMUSCULAR | Status: AC
  Administered 2012-06-17: 4 mg via INTRAVENOUS
  Filled 2012-06-17: qty 2

## 2012-06-17 MED ORDER — SODIUM CHLORIDE 0.9 % IV SOLN: INTRAVENOUS | Status: DC

## 2012-06-17 MED ORDER — KETOROLAC TROMETHAMINE 30 MG/ML IJ SOLN
30.0000 mg | Freq: Once | INTRAMUSCULAR | Status: AC
  Administered 2012-06-17: 30 mg via INTRAVENOUS
  Filled 2012-06-17: qty 1

## 2012-06-17 MED ORDER — ALBUTEROL SULFATE (5 MG/ML) 0.5% IN NEBU
5.0000 mg | INHALATION_SOLUTION | Freq: Once | RESPIRATORY_TRACT | Status: AC
  Administered 2012-06-17: 5 mg via RESPIRATORY_TRACT
  Filled 2012-06-17: qty 40

## 2012-06-17 MED ORDER — MORPHINE SULFATE 4 MG/ML IJ SOLN
4.0000 mg | Freq: Once | INTRAMUSCULAR | Status: AC
  Administered 2012-06-17: 4 mg via INTRAVENOUS
  Filled 2012-06-17 (×2): qty 1

## 2012-06-17 MED ORDER — IPRATROPIUM BROMIDE 0.02 % IN SOLN
0.5000 mg | Freq: Once | RESPIRATORY_TRACT | Status: AC
  Administered 2012-06-17: 0.5 mg via RESPIRATORY_TRACT
  Filled 2012-06-17: qty 2.5

## 2012-06-17 NOTE — ED Notes (Signed)
Internal medicine at bedside

## 2012-06-17 NOTE — ED Provider Notes (Signed)
I saw and evaluated the patient, reviewed the resident's note and I agree with the findings and plan.  Aashi Derrington, MD 06/17/12 1531 

## 2012-06-17 NOTE — ED Notes (Signed)
Pt is here with 2 days gradual onset of pain to chest and back and hurts to breath.  PT reports freezing.  Pt reports coughing for one week now.  Pt is coughing up yellow sputum

## 2012-06-17 NOTE — ED Notes (Signed)
Pt. Was unable to eat dinner due to her pain , pt. Has been medicated and will reevaluate .

## 2012-06-17 NOTE — ED Provider Notes (Signed)
History     CSN: [id number]  Arrival date & time 06/17/12  4098   First MD Initiated Contact with Patient 06/17/12 1003      No chief complaint on file.   (Consider location/radiation/quality/duration/timing/severity/associated sxs/prior treatment) Patient is a 26 y.o. female presenting with URI and back pain. The history is provided by the patient.  URI The primary symptoms include cough. Primary symptoms do not include fever, wheezing, abdominal pain, nausea or vomiting. The current episode started 3 to 5 days ago. This is a new problem. The problem has been gradually worsening.  The cough is productive. The sputum is yellow.  Symptoms associated with the illness include chills, congestion and rhinorrhea. The illness is not associated with sinus pressure.  Back Pain  This is a new problem. The current episode started 2 days ago. The problem occurs constantly. The problem has been gradually worsening. The pain is associated with no known injury. The pain is present in the thoracic spine. The pain is at a severity of 9/10. The pain is severe. The symptoms are aggravated by certain positions (inspiration, cough). The pain is the same all the time. Associated symptoms include dysuria. Pertinent negatives include no chest pain, no fever, no numbness, no abdominal pain and no weakness.    Past Medical History  Diagnosis Date  . UTI (lower urinary tract infection)   . Bronchial asthma   . Gonorrhea     Past Surgical History  Procedure Date  . Wisdom tooth extraction     No family history on file.  History  Substance Use Topics  . Smoking status: Current Everyday Smoker -- 0.2 packs/day  . Smokeless tobacco: Not on file  . Alcohol Use: No    OB History    Grav Para Term Preterm Abortions TAB SAB Ect Mult Living   1 1        1       Review of Systems  Constitutional: Positive for chills. Negative for fever.  HENT: Positive for congestion and rhinorrhea. Negative for neck  pain, neck stiffness and sinus pressure.   Respiratory: Positive for cough. Negative for shortness of breath and wheezing.   Cardiovascular: Negative for chest pain and leg swelling.  Gastrointestinal: Positive for constipation. Negative for nausea, vomiting, abdominal pain and diarrhea.  Genitourinary: Positive for dysuria and frequency.  Musculoskeletal: Positive for back pain.  Neurological: Negative for weakness and numbness.  All other systems reviewed and are negative.    Allergies  Tomato  Home Medications   Current Outpatient Rx  Name Route Sig Dispense Refill  . ALBUTEROL IN Inhalation Inhale into the lungs.    . MELOXICAM 15 MG PO TABS Oral Take 1 tablet (15 mg total) by mouth daily. 15 tablet 0  . PREDNISONE 10 MG PO TABS  Take 4 tabs daily for 4 days, 3 tabs daily for 4 days, 2 tabs daily for 4 days, then 1 tab daily for 4 days. 40 tablet 0    BP 123/70  Pulse 112  Temp 99.8 F (37.7 C) (Oral)  Resp 18  SpO2 98%  LMP 06/08/2012  Physical Exam  Nursing note and vitals reviewed. Constitutional: She is oriented to person, place, and time. She appears well-developed and well-nourished. No distress.  HENT:  Head: Normocephalic and atraumatic.  Right Ear: External ear normal.  Left Ear: External ear normal.  Nose: Nose normal.  Mouth/Throat: Oropharynx is clear and moist.  Neck: Neck supple.  Cardiovascular: Regular rhythm, normal heart sounds  and intact distal pulses.  Tachycardia present.   Pulmonary/Chest: Effort normal and breath sounds normal. No respiratory distress. She has no wheezes. She has no rales. She exhibits no tenderness.  Abdominal: Soft. She exhibits no distension. There is no tenderness.  Musculoskeletal: She exhibits no edema.       Thoracic back: She exhibits tenderness. She exhibits no bony tenderness.       Back:  Lymphadenopathy:    She has no cervical adenopathy.  Neurological: She is alert and oriented to person, place, and time.    Skin: Skin is warm and dry. She is not diaphoretic.    ED Course  Procedures (including critical care time)  Labs Reviewed  URINALYSIS, ROUTINE W REFLEX MICROSCOPIC - Abnormal; Notable for the following:    Color, Urine AMBER (*)  BIOCHEMICALS MAY BE AFFECTED BY COLOR   APPearance TURBID (*)     Hgb urine dipstick LARGE (*)     Ketones, ur 15 (*)     Protein, ur >300 (*)     Urobilinogen, UA 2.0 (*)     Nitrite POSITIVE (*)     Leukocytes, UA LARGE (*)     All other components within normal limits  URINE MICROSCOPIC-ADD ON - Abnormal; Notable for the following:    Squamous Epithelial / LPF FEW (*)     Bacteria, UA MANY (*)     All other components within normal limits  POCT PREGNANCY, URINE  CBC WITH DIFFERENTIAL  URINE CULTURE   Dg Chest 2 View  06/17/2012  *RADIOLOGY REPORT*  Clinical Data: Coughing and right-sided chest pain for 2 days. Smoker and history of asthma.  CHEST - 2 VIEW  Comparison: 08/25/2009.  Findings: There are mild chronically accentuated bronchial markings.  There are no infiltrates or edematous changes.  The heart and mediastinal structures are normal.  There is no pneumothorax.  There is a small right cervical rib.  IMPRESSION: Mild stable chronic bronchitic changes.  No acute findings.   Original Report Authenticated By: Rolla Plate, M.D.      1. UTI (urinary tract infection)   2. Thoracic back pain   3. Bronchitis       MDM  26 yo female with URI symptoms and now productive cough with back pain, especially with inspiration and cough. No chest pain. Hurts to breathe but does not feel dyspneic. Patient's symtpoms significantly improved with albuterol and pain control. Re-examination still shows clear lung sounds and good air movement without wheezes. Urine positive for UTI, will send urine for culture and give rocephin. Back pain is higher than CVA, more c/w rib pain, likely from coughing. However will send CBC, BMP and continue giving fluids. Will  move to CDU and hydrate and give abx. If she improves and tachycardia resolves and BP is stable will d/c home. Otherwise will need admission.        Pricilla Loveless, MD 06/17/12 [phone]

## 2012-06-17 NOTE — ED Notes (Signed)
Pt. Having lower back pain into her lower abdominal area. Pain is a 10/10

## 2012-06-17 NOTE — ED Notes (Signed)
0.9% NS complete. 2nd liter. Pt reports "feeling warm". Fever as decreased. Denies pain. A.O. X 4. NAD.

## 2012-06-17 NOTE — ED Notes (Signed)
Patient is resting comfortably. 

## 2012-06-17 NOTE — H&P (Addendum)
[facility] History and Physical  Claire Riley JYN:[id number] DOB: 05-30-1986 DOA: 06/17/2012  Referring physician: Raeford Razor PCP: Zada Finders, NT   Chief Complaint: uti  HPI:  26 yo female with hx of uti, complains of dysuria, nausea, no emesis,  Bilateral flank pain right >left, and increase frequency of urination.   Her symptoms started on Sunday nite.  Subjective fever beginning today, but denies hematuria.  Pt was apparently tachycardic, and mildly hypotensive with sbp 90's,  Cardiac marker pending. ED felt uncomfortable sending her home and requests admission for observation.   Review of Systems:  Negative for all organ systems except for + above  Past Medical History  Diagnosis Date  . UTI (lower urinary tract infection)   . Bronchial asthma   . Gonorrhea   . Pneumonia    Past Surgical History  Procedure Date  . Wisdom tooth extraction    Social History:  reports that she has been smoking.  She does not have any smokeless tobacco history on file. She reports that she does not drink alcohol or use illicit drugs. Lives with grandmother , able to perform all ADLS Occupation concessions for the [employer]   Allergies  Allergen Reactions  . Tomato Hives    Family History  Problem Relation Age of Onset  . Hypertension Father     (be sure to complete)  Prior to Admission medications   Medication Sig Start Date End Date Taking? Authorizing Provider  ibuprofen (ADVIL,MOTRIN) 200 MG tablet Take 800 mg by mouth every 6 (six) hours as needed. For pain   Yes Historical Provider, MD   Physical Exam: Filed Vitals:   06/17/12 1800 06/17/12 1821 06/17/12 1936 06/17/12 2036  BP: 101/60 101/60 93/42 97/47   Pulse: 109 121 112 95  Temp:  101.4 F (38.6 C) 101.4 F (38.6 C) 99.3 F (37.4 C)  TempSrc:  Oral Oral Oral  Resp: 20 20 12 12   SpO2: 100% 96% 96% 97%     General:  No acute distress  Eyes: anicteric  ENT: mmm  Neck: no jvd, no  bruit, no tm, no adenopathy  Cardiovascular: rrr s1, s2  Respiratory: ctab  Abdomen: soft, nt, nd, +bs  Skin: no c/c/e  Musculoskeletal: wnl  Psychiatric: axox3  Neurologic: nonfocal, cn2-12 intact, reflexes 2+ symmetric, diffuse with downgoing toes bilaterally. Motor 5/5 in all 4 ext  Labs on Admission:  Basic Metabolic Panel:  Lab 06/17/12 8657  NA 139  K 3.5  CL 103  CO2 --  GLUCOSE 100*  BUN 10  CREATININE 0.90  CALCIUM --  MG --  PHOS --   Liver Function Tests: No results found for this basename: AST:5,ALT:5,ALKPHOS:5,BILITOT:5,PROT:5,ALBUMIN:5 in the last 168 hours No results found for this basename: LIPASE:5,AMYLASE:5 in the last 168 hours No results found for this basename: AMMONIA:5 in the last 168 hours CBC:  Lab 06/17/12 1548 06/17/12 1458  WBC -- 6.7  NEUTROABS -- 5.7  HGB 11.9* 11.7*  HCT 35.0* 35.2*  MCV -- 81.9  PLT -- 140*   Cardiac Enzymes:  Lab 06/17/12 2130  CKTOTAL 68  CKMB 1.2  CKMBINDEX --  TROPONINI <0.30    BNP (last 3 results) No results found for this basename: PROBNP:3 in the last 8760 hours CBG: No results found for this basename: GLUCAP:5 in the last 168 hours  Radiological Exams on Admission: Dg Chest 2 View  06/17/2012  *RADIOLOGY REPORT*  Clinical Data: Coughing and right-sided chest pain for 2 days. Smoker and  history of asthma.  CHEST - 2 VIEW  Comparison: 08/25/2009.  Findings: There are mild chronically accentuated bronchial markings.  There are no infiltrates or edematous changes.  The heart and mediastinal structures are normal.  There is no pneumothorax.  There is a small right cervical rib.  IMPRESSION: Mild stable chronic bronchitic changes.  No acute findings.   Original Report Authenticated By: Rolla Plate, M.D.     EKG:   Assessment/Plan Active Problems:  UTI (lower urinary tract infection)  Tachycardia  1. Uti: rocephin 1gm iv qday 2. Tachycardia likely secondary to fever, infection,  Resolved,   Hydrate with ns iv,  If persistent check tsh, cardiach echo , check 1set of cardiac markers 3. DVT prophylaxis scd  Code Status: Full Family Communication: Stormy Fabian [phone] Disposition Plan:  [place]  Time spent: 35 minutes  Pearson Grippe [facility] Pager [phone] If 7PM-7AM, please contact night-coverage [email or web address] Password [facility] 06/17/2012, 10:46 PM

## 2012-06-17 NOTE — ED Notes (Addendum)
Pt reports feeling hot. Skin warm, dry,  to the touch intact. NAD. Laying down in bed. 0.9% NS running 153ml/hr. Pt. Laying down in bed watching bed. Lights low for comfort measures. Denies pain. No further needs at this time. A.O. X 4.

## 2012-06-18 DIAGNOSIS — M546 Pain in thoracic spine: Secondary | ICD-10-CM

## 2012-06-18 DIAGNOSIS — R509 Fever, unspecified: Secondary | ICD-10-CM

## 2012-06-18 LAB — COMPREHENSIVE METABOLIC PANEL
BUN: 11 mg/dL (ref 6–23)
CO2: 23 mEq/L (ref 19–32)
Calcium: 8.2 mg/dL — ABNORMAL LOW (ref 8.4–10.5)
Chloride: 106 mEq/L (ref 96–112)
Creatinine, Ser: 0.87 mg/dL (ref 0.50–1.10)
GFR calc non Af Amer: >90 mL/min (ref >90)
Total Bilirubin: 2 mg/dL — ABNORMAL HIGH (ref 0.3–1.2)

## 2012-06-18 LAB — CBC
Hemoglobin: 11.2 g/dL — ABNORMAL LOW (ref 12.0–15.0)
Platelets: 139 10*3/uL — ABNORMAL LOW (ref 150–400)
RBC: 4.21 MIL/uL (ref 3.87–5.11)
WBC: 6.6 10*3/uL (ref 4.0–10.5)

## 2012-06-18 MED ORDER — PANTOPRAZOLE SODIUM 40 MG PO TBEC: 40.0000 mg | DELAYED_RELEASE_TABLET | Freq: Every day | ORAL | Status: DC

## 2012-06-18 MED ORDER — ACETAMINOPHEN 325 MG PO TABS
650.0000 mg | ORAL_TABLET | ORAL | Status: DC | PRN
  Administered 2012-06-18 (×2): 650 mg via ORAL
  Filled 2012-06-18 (×3): qty 2

## 2012-06-18 MED ORDER — MORPHINE SULFATE 4 MG/ML IJ SOLN: 4.0000 mg | INTRAMUSCULAR | Status: DC | PRN

## 2012-06-18 MED ORDER — IBUPROFEN 400 MG PO TABS
400.0000 mg | ORAL_TABLET | Freq: Three times a day (TID) | ORAL | Status: DC
  Administered 2012-06-18 – 2012-06-19 (×3): 400 mg via ORAL
  Filled 2012-06-18 (×6): qty 1

## 2012-06-18 MED ORDER — ENSURE COMPLETE PO LIQD
237.0000 mL | ORAL | Status: DC
  Administered 2012-06-18 – 2012-06-19 (×2): 237 mL via ORAL

## 2012-06-18 MED ORDER — OXYCODONE-ACETAMINOPHEN 5-325 MG PO TABS
1.0000 | ORAL_TABLET | Freq: Four times a day (QID) | ORAL | Status: DC | PRN
  Administered 2012-06-18 (×2): 1 via ORAL
  Filled 2012-06-18 (×2): qty 1

## 2012-06-18 MED ORDER — ACETAMINOPHEN 500 MG PO TABS
1000.0000 mg | ORAL_TABLET | ORAL | Status: DC | PRN
  Administered 2012-06-18: 1000 mg via ORAL
  Filled 2012-06-18 (×2): qty 2

## 2012-06-18 NOTE — Progress Notes (Signed)
INITIAL ADULT NUTRITION ASSESSMENT Date: 06/18/2012   Time: 11:47 AM Reason for Assessment: Nutrition Risk   INTERVENTION: 1. Ensure Complete po daily, each supplement provides 350 kcal and 13 grams of protein. 2. RD will continue to follow    ASSESSMENT: Female 26 y.o.  Dx: UTI  Hx:  Past Medical History  Diagnosis Date  . UTI (lower urinary tract infection)   . Bronchial asthma   . Gonorrhea   . Pneumonia     Related Meds:     . acetaminophen  975 mg Oral Once  . cefTRIAXone (ROCEPHIN)  IV  1 g Intravenous Once  . cefTRIAXone (ROCEPHIN)  IV  1 g Intravenous Q24H  . ketorolac  30 mg Intravenous Once  . ketorolac  30 mg Intravenous Once  .  morphine injection  4 mg Intravenous Once  . ondansetron (ZOFRAN) IV  4 mg Intravenous Once  . sodium chloride  1,000 mL Intravenous Once  . sodium chloride  3 mL Intravenous Q12H  . DISCONTD: sodium chloride   Intravenous STAT     Ht: 6\' 1"  (185.4 cm)  Wt: 223 lb 1.7 oz (101.2 kg)  Ideal Wt: 75 kg  % Ideal Wt: 135%  Usual Wt: 226 lbs per pt report\ Wt Readings from Last 5 Encounters:  06/18/12 223 lb 1.7 oz (101.2 kg)  08/18/11 195 lb 4 oz (88.565 kg)    % Usual Wt: 99%  Body mass index is 29.44 kg/(m^2). Pt is overweight per current BMI   Food/Nutrition Related Hx: Pt reports weight loss of 26 lbs in the past 2 months  Labs:  CMP     Component Value Date/Time   NA 139 06/18/2012 0631   K 3.3* 06/18/2012 0631   CL 106 06/18/2012 0631   CO2 23 06/18/2012 0631   GLUCOSE 103* 06/18/2012 0631   BUN 11 06/18/2012 0631   CREATININE 0.87 06/18/2012 0631   CALCIUM 8.2* 06/18/2012 0631   PROT 6.1 06/18/2012 0631   ALBUMIN 3.0* 06/18/2012 0631   AST 24 06/18/2012 0631   ALT 21 06/18/2012 0631   ALKPHOS 78 06/18/2012 0631   BILITOT 2.0* 06/18/2012 0631   GFRNONAA >90 06/18/2012 0631   GFRAA >90 06/18/2012 0631     Intake/Output Summary (Last 24 hours) at 06/18/12 1150 Last data filed at 06/18/12 0100  Gross per 24 hour    Intake 1206.67 ml  Output      0 ml  Net 1206.67 ml     Diet Order: General  Supplements/Tube Feeding: none  IVF:    sodium chloride Last Rate: 100 mL/hr at 06/18/12 1015    Estimated Nutritional Needs:   Kcal: 2000-2200  Protein: 90-100 gm  Fluid: 2-2.2 L   Pt admitted with UTI. Pt states that for the past 2 months she has not had any appetite and has been eating only one small meal. Pt states that she gone from 226 lbs down to 200 lbs. Per current weight, pt is 223 lbs. No recent weight on file. Pt with some dehydration on admission, likely contributed to some weight loss.   Pt states that she was able to tolerate breakfast this morning. A little nauseous right now, but not to bad.  RD will order Ensure for pt once daily, told her to try and drink it if she is unable to eat a meal.   NUTRITION DIAGNOSIS: -Inadequate oral intake (NI-2.1).  Status: Ongoing  RELATED TO: poor appetite  AS EVIDENCE BY: pt report of  one small meal daily   MONITORING/EVALUATION(Goals): Goal: po intake of meals and supplements to meet >90% of estimated nutrition needs Monitor: PO intake, weight   EDUCATION NEEDS: -No education needs identified at this time    DOCUMENTATION CODES Per approved criteria  -Not Applicable    Clarene Duke RD, LDN Pager [phone] After Hours pager [phone]

## 2012-06-18 NOTE — Progress Notes (Signed)
[facility] PROGRESS NOTE  Claire Riley ZOX:[id number] DOB: August 31, 1986 DOA: 06/17/2012   Assessment/Plan: Patient Active Hospital Problem List: UTI (lower urinary tract infection) (06/17/2012)  -continues to have fevers, add ibuprofen around-the-clock continue on protonix. -UA was sent which is compatible with urinary tract infection. Awaiting urine cultures.  Tachycardia (06/17/2012) -likely secondary to fevers, continue to monitor the patient heart rate has never been able 110.  Code Status: Full code Family Communication: Patient Disposition Plan: Home     LOS: 1 day   Procedures:  None  Antibiotics: Rocephin  Subjective: The patient continues to feel lousy, she's been having chills and was complaining of back pain.  Objective: Filed Vitals:   06/17/12 2036 06/17/12 2200 06/18/12 0608 06/18/12 1421  BP: 97/47 115/75 109/76 122/82  Pulse: 95 94 104 105  Temp: 99.3 F (37.4 C) 98.5 F (36.9 C) 101 F (38.3 C) 100.2 F (37.9 C)  TempSrc: Oral Oral Oral Oral  Resp: 12 20 20 20   Height:  6\' 1"  (1.854 m)    Weight:  99.1 kg (218 lb 7.6 oz) 101.2 kg (223 lb 1.7 oz)   SpO2: 97% 98% 96% 97%    Intake/Output Summary (Last 24 hours) at 06/18/12 1623 Last data filed at 06/18/12 1422  Gross per 24 hour  Intake 1686.67 ml  Output      2 ml  Net 1684.67 ml   Weight change:   Exam:  General: Alert, awake, oriented x3, in no acute distress. Feels warm to touch Heart: Regular rate and rhythm, without murmurs, rubs, gallops.  Lungs: Good air movement, clear to auscultation bilaterally Abdomen: Soft, nontender, nondistended, positive bowel sounds. No CVA tenderness Neuro: Grossly intact, nonfocal.   Data Reviewed: Basic Metabolic Panel:  Lab 06/18/12 8119 06/17/12 1548  NA 139 139  K 3.3* 3.5  CL 106 103  CO2 23 --  GLUCOSE 103* 100*  BUN 11 10  CREATININE 0.87 0.90  CALCIUM 8.2* --  MG -- --  PHOS -- --   Liver Function Tests:  Lab 06/18/12  0631  AST 24  ALT 21  ALKPHOS 78  BILITOT 2.0*  PROT 6.1  ALBUMIN 3.0*   No results found for this basename: LIPASE:5,AMYLASE:5 in the last 168 hours No results found for this basename: AMMONIA:5 in the last 168 hours CBC:  Lab 06/18/12 0631 06/17/12 1548 06/17/12 1458  WBC 6.6 -- 6.7  NEUTROABS -- -- 5.7  HGB 11.2* 11.9* 11.7*  HCT 34.0* 35.0* 35.2*  MCV 80.8 -- 81.9  PLT 139* -- 140*   Cardiac Enzymes:  Lab 06/17/12 2130  CKTOTAL 68  CKMB 1.2  CKMBINDEX --  TROPONINI <0.30   BNP: No components found with this basename: POCBNP:5 CBG: No results found for this basename: GLUCAP:5 in the last 168 hours  Recent Results (from the past 240 hour(s))  URINE CULTURE     Status: Normal (Preliminary result)   Collection Time   06/17/12  1:55 PM      Component Value Range Status Comment   Specimen Description URINE, RANDOM   Final    Special Requests ADDED AT 1449 ON [id number]   Final    Culture  Setup Time 06/17/2012 22:05   Final    Colony Count >=100,000 COLONIES/ML   Final    Culture ESCHERICHIA COLI   Final    Report Status PENDING   Incomplete      Studies: Dg Chest 2 View  06/17/2012  *RADIOLOGY REPORT*  Clinical Data:  Coughing and right-sided chest pain for 2 days. Smoker and history of asthma.  CHEST - 2 VIEW  Comparison: 08/25/2009.  Findings: There are mild chronically accentuated bronchial markings.  There are no infiltrates or edematous changes.  The heart and mediastinal structures are normal.  There is no pneumothorax.  There is a small right cervical rib.  IMPRESSION: Mild stable chronic bronchitic changes.  No acute findings.   Original Report Authenticated By: Rolla Plate, M.D.     Scheduled Meds:   . acetaminophen  975 mg Oral Once  . cefTRIAXone (ROCEPHIN)  IV  1 g Intravenous Q24H  . feeding supplement  237 mL Oral Q24H  . ketorolac  30 mg Intravenous Once  .  morphine injection  4 mg Intravenous Once  . sodium chloride  1,000 mL Intravenous Once    . sodium chloride  3 mL Intravenous Q12H  . DISCONTD: sodium chloride   Intravenous STAT   Continuous Infusions:   . sodium chloride 100 mL/hr at 06/18/12 1015    Lambert Keto, MD  [facility] Pager [phone]  If 7PM-7AM, please contact night-coverage [email or web address] Password [facility] 06/18/2012, 4:23 PM

## 2012-06-18 NOTE — Care Management Note (Signed)
    Page 1 of 1   06/19/2012     2:09:43 PM   CARE MANAGEMENT NOTE 06/19/2012  Patient:  Claire Riley, Claire Riley   Account Number:  [id number]  Date Initiated:  06/18/2012  Documentation initiated by:  Letha Cape  Subjective/Objective Assessment:   dx uti  admit-lives with grandmother. pta independernt.     Action/Plan:   Anticipated DC Date:  06/19/2012   Anticipated DC Plan:  HOME/SELF CARE      DC Planning Services  CM consult      Choice offered to / List presented to:             Status of service:  Completed, signed off Medicare Important Message given?   (If response is "NO", the following Medicare IM given date fields will be blank) Date Medicare IM given:   Date Additional Medicare IM given:    Discharge Disposition:  HOME/SELF CARE  Per UR Regulation:  Reviewed for med. necessity/level of care/duration of stay  If discussed at Long Length of Stay Meetings, dates discussed:    Comments:  06/19/12 14:08 Letha Cape RN, BSN [phone] patient dc to home , no needs, will get meds from [facility].  06/18/12 16:02 Letha Cape RN, BSN [phone] patient is eligible for med ast if needed.  PTA independent.  NCM will continue to follow for dc needs.

## 2012-06-19 LAB — URINE CULTURE

## 2012-06-19 MED ORDER — CIPROFLOXACIN HCL 500 MG PO TABS: 500.0000 mg | ORAL_TABLET | Freq: Two times a day (BID) | ORAL | Status: AC

## 2012-06-19 MED ORDER — PANTOPRAZOLE SODIUM 40 MG PO TBEC: 40.0000 mg | DELAYED_RELEASE_TABLET | Freq: Every day | ORAL | Status: DC

## 2012-06-19 MED ORDER — OXYCODONE HCL 15 MG PO TABS: 30.0000 mg | ORAL_TABLET | ORAL | Status: AC | PRN

## 2012-06-19 MED ORDER — CIPROFLOXACIN IN D5W 400 MG/200ML IV SOLN
400.0000 mg | Freq: Two times a day (BID) | INTRAVENOUS | Status: DC
  Administered 2012-06-19: 400 mg via INTRAVENOUS
  Filled 2012-06-19 (×2): qty 200

## 2012-06-19 MED ORDER — IBUPROFEN 200 MG PO TABS: 800.0000 mg | ORAL_TABLET | Freq: Four times a day (QID) | ORAL | Status: DC | PRN

## 2012-06-19 MED ORDER — CEFTRIAXONE SODIUM 1 G IJ SOLR
1.0000 g | Freq: Once | INTRAMUSCULAR | Status: AC
  Administered 2012-06-19: 1 g via INTRAVENOUS
  Filled 2012-06-19: qty 10

## 2012-06-19 NOTE — Progress Notes (Signed)
Spoke with patient about hesitation to be discharged. She wants to feel 100% when she goes home because her 24 year old son depends on her. Her grandmother is unable to "keep up with his energy". I explained that she will not feel 100% going home, but will have pain medication to help her. I encouraged her to ask other friends and family to help her so she can recover completely.

## 2012-06-19 NOTE — Progress Notes (Signed)
Maanvi Briney to be D/C'd Home per MD order.  Discussed with the patient and all questions fully answered.   Delayna, Sparlin  Home Medication Instructions [id number]   Printed on:06/19/12 1227  Medication Information                    ciprofloxacin (CIPRO) 500 MG tablet Take 1 tablet (500 mg total) by mouth 2 (two) times daily.           ibuprofen (ADVIL,MOTRIN) 200 MG tablet Take 4 tablets (800 mg total) by mouth every 6 (six) hours as needed for pain or fever. For pain           pantoprazole (PROTONIX) 40 MG tablet Take 1 tablet (40 mg total) by mouth daily.           oxyCODONE (ROXICODONE) 15 MG immediate release tablet Take 2 tablets (30 mg total) by mouth every 4 (four) hours as needed for pain.             VVS, Skin clean, dry and intact without evidence of skin break down, no evidence of skin tears noted. IV catheter discontinued intact. Site without signs and symptoms of complications. Dressing and pressure applied.  An After Visit Summary was printed and given to the patient. Patient escorted via WC, and D/C home via private auto.  Kennyth Arnold D 06/19/2012 12:27 PM

## 2012-06-19 NOTE — Discharge Summary (Signed)
Physician Discharge Summary  Claire Riley ZOX:[id number] DOB: 1986/05/17 DOA: 06/17/2012  PCP: Zada Finders, NT  Admit date: 06/17/2012 Discharge date: 06/19/2012  Discharge Diagnoses:  Active Problems:  UTI (lower urinary tract infection)  Tachycardia   Discharge Condition: Stable for DC  Disposition:  Follow-up Information    Follow up with Zada Finders, NT in 2 weeks. (hospital follow up)    Contact information:   - - - [facility] [place] [address]          Diet: Regular it Wt Readings from Last 3 Encounters:  06/19/12 101.4 kg (223 lb 8.7 oz)  08/18/11 88.565 kg (195 lb 4 oz)    History of present illness:  26 yo female with hx of uti, complains of dysuria, nausea, no emesis, Bilateral flank pain right >left, and increase frequency of urination. Her symptoms started on Sunday nite. Subjective fever beginning today, but denies hematuria. Pt was apparently tachycardic, and mildly hypotensive with sbp 90's, Cardiac marker pending. ED felt uncomfortable sending her home and requests admission for observation.    Hospital Course:  Active Problems: UTI (lower urinary tract infection): She was a medical hospital start on IV Rocephin urine cultures were sent that showed Escherichia coli sensitive to Cipro she was changed to Cipro should continue for 6 more days. Ibuprofen for fevers at home and oxycodone short course for back pain. Started her protonix.  Tachycardia -2/2 to fever now resolved.  Discharge Exam: Filed Vitals:   06/19/12 0610  BP: 113/74  Pulse: 84  Temp: 98.4 F (36.9 C)  Resp: 18   Filed Vitals:   06/18/12 0608 06/18/12 1421 06/19/12 0500 06/19/12 0610  BP: 109/76 122/82  113/74  Pulse: 104 105  84  Temp: 101 F (38.3 C) 100.2 F (37.9 C)  98.4 F (36.9 C)  TempSrc: Oral Oral  Oral  Resp: 20 20  18   Height:      Weight: 101.2 kg (223 lb 1.7 oz)  105.3 kg (232 lb 2.3 oz) 101.4 kg (223 lb 8.7 oz)  SpO2: 96% 97%  98%   General: Awake  alert and oriented times Cardiovascular: Regular rate and rhythm Respiratory: Good air movement clear to auscultation  Discharge Instructions  Discharge Orders    Future Orders Please Complete By Expires   Diet - low sodium heart healthy      Increase activity slowly        Medication List  As of 06/19/2012  9:55 AM   TAKE these medications         ciprofloxacin 500 MG tablet   Commonly known as: CIPRO   Take 1 tablet (500 mg total) by mouth 2 (two) times daily.      ibuprofen 200 MG tablet   Commonly known as: ADVIL,MOTRIN   Take 4 tablets (800 mg total) by mouth every 6 (six) hours as needed for pain or fever. For pain      oxyCODONE 15 MG immediate release tablet   Commonly known as: ROXICODONE   Take 2 tablets (30 mg total) by mouth every 4 (four) hours as needed for pain.      pantoprazole 40 MG tablet   Commonly known as: PROTONIX   Take 1 tablet (40 mg total) by mouth daily.              The results of significant diagnostics from this hospitalization (including imaging, microbiology, ancillary and laboratory) are listed below for reference.    Significant Diagnostic Studies: Dg  Chest 2 View  06/17/2012  *RADIOLOGY REPORT*  Clinical Data: Coughing and right-sided chest pain for 2 days. Smoker and history of asthma.  CHEST - 2 VIEW  Comparison: 08/25/2009.  Findings: There are mild chronically accentuated bronchial markings.  There are no infiltrates or edematous changes.  The heart and mediastinal structures are normal.  There is no pneumothorax.  There is a small right cervical rib.  IMPRESSION: Mild stable chronic bronchitic changes.  No acute findings.   Original Report Authenticated By: Rolla Plate, M.D.     Microbiology: Recent Results (from the past 240 hour(s))  URINE CULTURE     Status: Normal   Collection Time   06/17/12  1:55 PM      Component Value Range Status Comment   Specimen Description URINE, RANDOM   Final    Special Requests ADDED AT  1449 ON 161096   Final    Culture  Setup Time 06/17/2012 22:05   Final    Colony Count >=100,000 COLONIES/ML   Final    Culture ESCHERICHIA COLI   Final    Report Status 06/19/2012 FINAL   Final    Organism ID, Bacteria ESCHERICHIA COLI   Final      Labs: Basic Metabolic Panel:  Lab 06/18/12 0454 06/17/12 1548  NA 139 139  K 3.3* 3.5  CL 106 103  CO2 23 --  GLUCOSE 103* 100*  BUN 11 10  CREATININE 0.87 0.90  CALCIUM 8.2* --  MG -- --  PHOS -- --   Liver Function Tests:  Lab 06/18/12 0631  AST 24  ALT 21  ALKPHOS 78  BILITOT 2.0*  PROT 6.1  ALBUMIN 3.0*   No results found for this basename: LIPASE:5,AMYLASE:5 in the last 168 hours No results found for this basename: AMMONIA:5 in the last 168 hours CBC:  Lab 06/18/12 0631 06/17/12 1548 06/17/12 1458  WBC 6.6 -- 6.7  NEUTROABS -- -- 5.7  HGB 11.2* 11.9* 11.7*  HCT 34.0* 35.0* 35.2*  MCV 80.8 -- 81.9  PLT 139* -- 140*   Cardiac Enzymes:  Lab 06/17/12 2130  CKTOTAL 68  CKMB 1.2  CKMBINDEX --  TROPONINI <0.30   BNP: No components found with this basename: POCBNP:5 CBG: No results found for this basename: GLUCAP:5 in the last 168 hours  Time coordinating discharge:35 minutes  Signed:  Marinda Elk  [facility] 06/19/2012, 9:55 AM

## 2012-10-29 NOTE — L&D Delivery Note (Signed)
Attestation of Attending Supervision of Advanced Practitioner (CNM/NP): Evaluation and management procedures were performed by the Advanced Practitioner under my supervision and collaboration. I have reviewed the Advanced Practitioner's note and chart, and I agree with the management and plan.  Scot Shiraishi H. 10:16 PM   

## 2012-10-29 NOTE — L&D Delivery Note (Signed)
Delivery Note At 7:24 AM a viable female was delivered via  (Presentation: direct OA ;  ).  APGAR: crying within 1 min , ; weight .  pending Placenta status: intact, Spontaneous, .  Cord: 3v with the following complications: peri labial tear repaired .  Cord pH: NA  Anesthesia:  None Episiotomy: None Lacerations: Peri urethral repaired w 4.0 on sh with 2 interupted Suture Repair: vicryl 4.0 Est. Blood Loss (mL):   Mom to postpartum.  Baby to nursery-stable.  Jolyn Lent, RYAN 06/02/2013, 7:40 AM

## 2013-01-23 ENCOUNTER — Encounter (HOSPITAL_COMMUNITY): Payer: Self-pay | Admitting: *Deleted

## 2013-01-23 ENCOUNTER — Inpatient Hospital Stay (HOSPITAL_COMMUNITY)
Admission: AD | Admit: 2013-01-23 | Discharge: 2013-01-23 | Disposition: A | Discharge status: Home / Self Care | Payer: Medicaid Other | Source: Ambulatory Visit | Attending: Obstetrics & Gynecology | Admitting: Obstetrics & Gynecology

## 2013-01-23 ENCOUNTER — Inpatient Hospital Stay (HOSPITAL_COMMUNITY): Payer: Medicaid Other

## 2013-01-23 DIAGNOSIS — A5901 Trichomonal vulvovaginitis: Secondary | ICD-10-CM

## 2013-01-23 DIAGNOSIS — O0930 Supervision of pregnancy with insufficient antenatal care, unspecified trimester: Secondary | ICD-10-CM | POA: Insufficient documentation

## 2013-01-23 DIAGNOSIS — R109 Unspecified abdominal pain: Secondary | ICD-10-CM | POA: Insufficient documentation

## 2013-01-23 DIAGNOSIS — O98819 Other maternal infectious and parasitic diseases complicating pregnancy, unspecified trimester: Secondary | ICD-10-CM | POA: Insufficient documentation

## 2013-01-23 DIAGNOSIS — O26899 Other specified pregnancy related conditions, unspecified trimester: Secondary | ICD-10-CM

## 2013-01-23 HISTORY — DX: Major depressive disorder, single episode, unspecified: F32.9

## 2013-01-23 HISTORY — DX: Headache: R51

## 2013-01-23 HISTORY — DX: Depression, unspecified: F32.A

## 2013-01-23 HISTORY — DX: Trichomoniasis, unspecified: A59.9

## 2013-01-23 LAB — DIFFERENTIAL
Basophils Absolute: 0 10*3/uL (ref 0.0–0.1)
Basophils Relative: 0 % (ref 0–1)
Eosinophils Absolute: 0.1 10*3/uL (ref 0.0–0.7)
Eosinophils Relative: 1 % (ref 0–5)
Monocytes Absolute: 0.4 10*3/uL (ref 0.1–1.0)

## 2013-01-23 LAB — URINALYSIS, ROUTINE W REFLEX MICROSCOPIC
Bilirubin Urine: NEGATIVE
Hgb urine dipstick: NEGATIVE
Nitrite: NEGATIVE
Protein, ur: NEGATIVE mg/dL
Urobilinogen, UA: 1 mg/dL (ref 0.0–1.0)

## 2013-01-23 LAB — HCG, QUANTITATIVE, PREGNANCY: hCG, Beta Chain, Quant, S: 27466 m[IU]/mL — ABNORMAL HIGH (ref <5)

## 2013-01-23 LAB — CBC
MCH: 28.9 pg (ref 26.0–34.0)
MCHC: 34.7 g/dL (ref 30.0–36.0)
Platelets: 164 10*3/uL (ref 150–400)

## 2013-01-23 LAB — URINE MICROSCOPIC-ADD ON

## 2013-01-23 LAB — POCT PREGNANCY, URINE: Preg Test, Ur: POSITIVE — AB

## 2013-01-23 MED ORDER — AZITHROMYCIN 250 MG PO TABS: 1000.0000 mg | ORAL_TABLET | Freq: Once | ORAL | Status: DC

## 2013-01-23 MED ORDER — METRONIDAZOLE 500 MG PO TABS
1000.0000 mg | ORAL_TABLET | Freq: Once | ORAL | Status: AC
  Administered 2013-01-23: 1000 mg via ORAL
  Filled 2013-01-23: qty 4

## 2013-01-23 MED ORDER — METRONIDAZOLE 500 MG PO TABS
1000.0000 mg | ORAL_TABLET | Freq: Once | ORAL | Status: AC
  Administered 2013-01-23: 1000 mg via ORAL

## 2013-01-23 NOTE — MAU Note (Signed)
No problems, just need preg verification.  Health dept did not have any available appointments for preg test.

## 2013-01-23 NOTE — MAU Note (Signed)
States she had pain in lower abdomen when she woke up, bu none now.

## 2013-01-23 NOTE — MAU Provider Note (Signed)
History     CSN: [id number]  Arrival date and time: 01/23/13 1133   First Provider Initiated Contact with Patient 01/23/13 1259      Chief Complaint  Patient presents with  . Possible Pregnancy   HPI Pt is [redacted]w[redacted]d pregnant and presents with lower abdominal cramping on an off being lighter today. Pt is not having pain at this time.  She denies spotting or bleeding or vaginal discharge.  She has been nauseated but has not vomited.  Pt denies constipation or diarrhea.  Her previous 2 pregnancies ended in miscarriage in 1st trimester in May and Oct of 2012.  She was using condoms. She is awaiting Medicaid approval.   Past Medical History  Diagnosis Date  . UTI (lower urinary tract infection)   . Bronchial asthma   . Gonorrhea   . Pneumonia   . Depression   . Headache     Past Surgical History  Procedure Laterality Date  . Wisdom tooth extraction      Family History  Problem Relation Age of Onset  . Hypertension Father     History  Substance Use Topics  . Smoking status: Current Every Day Smoker -- 0.25 packs/day  . Smokeless tobacco: Never Used  . Alcohol Use: No    Allergies:  Allergies  Allergen Reactions  . Tomato Hives    Prescriptions prior to admission  Medication Sig Dispense Refill  . ibuprofen (ADVIL,MOTRIN) 200 MG tablet Take 4 tablets (800 mg total) by mouth every 6 (six) hours as needed for pain or fever. For pain  30 tablet  0  . pantoprazole (PROTONIX) 40 MG tablet Take 1 tablet (40 mg total) by mouth daily.  30 tablet  0    Review of Systems  Constitutional: Negative for fever.  Gastrointestinal: Positive for nausea and abdominal pain. Negative for vomiting, diarrhea and constipation.  Genitourinary: Negative for dysuria and urgency.   Physical Exam   Blood pressure 123/80, pulse 98, temperature 98.7 F (37.1 C), temperature source Oral, resp. rate 18, height 6\' 2"  (1.88 m), weight 222 lb (100.699 kg), last menstrual period  12/07/2012.  Physical Exam  Vitals reviewed. Constitutional: She is oriented to person, place, and time. She appears well-developed and well-nourished.  HENT:  Head: Normocephalic.  Eyes: Pupils are equal, round, and reactive to light.  Neck: Normal range of motion.  Respiratory: Effort normal.  GI: Soft. She exhibits no distension. There is no tenderness. There is no rebound and no guarding.  Genitourinary:  Mod-large amount of frothy white discharge in vault; cervix parous, mildly tender;diffuse bilateral adnexal tenderness- enlarged uterus but unable to appreciate size due to habitus.  Musculoskeletal: Normal range of motion.  Neurological: She is alert and oriented to person, place, and time.  Skin: Skin is warm and dry.  Psychiatric: She has a normal mood and affect.    MAU Course  Procedures Results for orders placed during the hospital encounter of 01/23/13 (from the past 24 hour(s))  POCT PREGNANCY, URINE     Status: Abnormal   Collection Time    01/23/13 11:55 AM      Result Value Range   Preg Test, Ur POSITIVE (*) NEGATIVE  URINALYSIS, ROUTINE W REFLEX MICROSCOPIC     Status: Abnormal   Collection Time    01/23/13 12:00 PM      Result Value Range   Color, Urine YELLOW  YELLOW   APPearance HAZY (*) CLEAR   Specific Gravity, Urine 1.015  1.005 - 1.030  pH 7.0  5.0 - 8.0   Glucose, UA NEGATIVE  NEGATIVE mg/dL   Hgb urine dipstick NEGATIVE  NEGATIVE   Bilirubin Urine NEGATIVE  NEGATIVE   Ketones, ur NEGATIVE  NEGATIVE mg/dL   Protein, ur NEGATIVE  NEGATIVE mg/dL   Urobilinogen, UA 1.0  0.0 - 1.0 mg/dL   Nitrite NEGATIVE  NEGATIVE   Leukocytes, UA SMALL (*) NEGATIVE  URINE MICROSCOPIC-ADD ON     Status: Abnormal   Collection Time    01/23/13 12:00 PM      Result Value Range   Squamous Epithelial / LPF MANY (*) RARE   WBC, UA 7-10  <3 WBC/hpf   Bacteria, UA MANY (*) RARE   Urine-Other AMORPHOUS URATES/PHOSPHATES    CBC     Status: Abnormal   Collection Time     01/23/13  1:05 PM      Result Value Range   WBC 8.6  4.0 - 10.5 K/uL   RBC 3.77 (*) 3.87 - 5.11 MIL/uL   Hemoglobin 10.9 (*) 12.0 - 15.0 g/dL   HCT 91.4 (*) 78.2 - 95.6 %   MCV 83.3  78.0 - 100.0 fL   MCH 28.9  26.0 - 34.0 pg   MCHC 34.7  30.0 - 36.0 g/dL   RDW 21.3  08.6 - 57.8 %   Platelets 164  150 - 400 K/uL  WET PREP, GENITAL     Status: Abnormal   Collection Time    01/23/13  1:17 PM      Result Value Range   Yeast Wet Prep HPF POC NONE SEEN  NONE SEEN   Trich, Wet Prep FEW (*) NONE SEEN   Clue Cells Wet Prep HPF POC NONE SEEN  NONE SEEN   WBC, Wet Prep HPF POC MODERATE (*) NONE SEEN    Assessment and Plan  Inadequate prenatal care- will refer pt to [facility] clinic Trichomonas vaginitis- treated in MAU Prenatal vitamins- OTC  Lavon Horn 01/23/2013, 12:59 PM

## 2013-01-24 LAB — SICKLE CELL SCREEN: Sickle Cell Screen: NEGATIVE

## 2013-01-24 LAB — URINE CULTURE: Colony Count: 85000

## 2013-01-24 LAB — HIV ANTIBODY (ROUTINE TESTING W REFLEX): HIV: NONREACTIVE

## 2013-01-24 LAB — RPR: RPR Ser Ql: NONREACTIVE

## 2013-01-24 LAB — HEPATITIS B SURFACE ANTIGEN: Hepatitis B Surface Ag: NEGATIVE

## 2013-01-26 LAB — RUBELLA SCREEN: Rubella: 21.6 Index — ABNORMAL HIGH (ref <0.90)

## 2013-02-01 ENCOUNTER — Inpatient Hospital Stay (HOSPITAL_COMMUNITY)
Admission: AD | Admit: 2013-02-01 | Discharge: 2013-02-01 | Disposition: A | Discharge status: Home / Self Care | Payer: Medicaid Other | Source: Ambulatory Visit | Attending: Obstetrics & Gynecology | Admitting: Obstetrics & Gynecology

## 2013-02-01 ENCOUNTER — Inpatient Hospital Stay (HOSPITAL_COMMUNITY): Payer: Medicaid Other

## 2013-02-01 ENCOUNTER — Encounter (HOSPITAL_COMMUNITY): Payer: Self-pay | Admitting: *Deleted

## 2013-02-01 DIAGNOSIS — O26899 Other specified pregnancy related conditions, unspecified trimester: Secondary | ICD-10-CM

## 2013-02-01 DIAGNOSIS — O4700 False labor before 37 completed weeks of gestation, unspecified trimester: Secondary | ICD-10-CM | POA: Insufficient documentation

## 2013-02-01 DIAGNOSIS — R109 Unspecified abdominal pain: Secondary | ICD-10-CM

## 2013-02-01 DIAGNOSIS — O9989 Other specified diseases and conditions complicating pregnancy, childbirth and the puerperium: Secondary | ICD-10-CM

## 2013-02-01 LAB — URINALYSIS, ROUTINE W REFLEX MICROSCOPIC
Hgb urine dipstick: NEGATIVE
Leukocytes, UA: NEGATIVE
Nitrite: NEGATIVE
Protein, ur: NEGATIVE mg/dL
Specific Gravity, Urine: 1.015 (ref 1.005–1.030)
Urobilinogen, UA: 1 mg/dL (ref 0.0–1.0)

## 2013-02-01 LAB — FETAL FIBRONECTIN: Fetal Fibronectin: NEGATIVE

## 2013-02-01 LAB — WET PREP, GENITAL
Trich, Wet Prep: NONE SEEN
Yeast Wet Prep HPF POC: NONE SEEN

## 2013-02-01 LAB — OB RESULTS CONSOLE GC/CHLAMYDIA: Gonorrhea: NEGATIVE

## 2013-02-01 MED ORDER — RANITIDINE HCL 150 MG PO TABS: 150.0000 mg | ORAL_TABLET | Freq: Two times a day (BID) | ORAL | Status: DC

## 2013-02-01 MED ORDER — GI COCKTAIL ~~LOC~~
30.0000 mL | Freq: Once | ORAL | Status: AC
  Administered 2013-02-01: 30 mL via ORAL
  Filled 2013-02-01: qty 30

## 2013-02-01 NOTE — MAU Note (Signed)
Pt reports she has been having abd cramping on and off since 2am. Hard for her to sit or to walk.Has first prenatal appointment with [facility] hospital clinic on 4/14.Denies Vag discharge or bleeding.

## 2013-02-01 NOTE — MAU Provider Note (Signed)
CSN: [id number]  Arrival date & time 02/01/13 1025  None  Chief Complaint   Patient presents with   .  Contractions    HPI  Claire Riley is a 27 y.o. [id number] at [redacted]w[redacted]d by a [redacted]w[redacted]d u/s who presents complaining of painful upper abdominal cramping since 2:30am this morning. The cramping comes and goes. It lasts 1-2 minutes with about 1.5 minutes in between. Last perceived fetal movement is right now. No bleeding or fluid leaking. Pt says the cramping feels the same as contractions. It's located over her upper/mid abdomen across the top. She found out she was pregnant less than two weeks ago and has an appointment with the [facility] hospital outpatient clinic on the 14th of April. Has not yet had a prenatal visit. Yesterday felt some pressure in her pelvis, and this feeling continues today.  Denies vaginal discharge. Has had some odor. Last sexual intercourse was 3 weeks ago. Has had two female partners in the last year. Denies headache or vision changes. Did feel like her feet & ankles were swollen yesterday.  Dating: last reported period was December 07, 2012 and was three days in duration but an u/s on 01/23/13 showed an EDD of 06/28/13.  Past Medical History   Diagnosis  Date   .  UTI (lower urinary tract infection)    .  Bronchial asthma    .  Gonorrhea    .  Pneumonia    .  Depression    .  Headache    .  Trichomonas    Had asthma (occasional attacks - 1-2x/month)  Medications:  No medicines  Not on PNV (can't afford OTC)  Past Surgical History   Procedure  Laterality  Date   .  Wisdom tooth extraction       2007    Family History   Problem  Relation  Age of Onset   .  Hypertension  Father    .  Hypertension  Mother    .  Diabetes  Father    .  Diabetes  Paternal Grandmother    .  Scoliosis  Mother    .  Scoliosis  Sister    .  COPD  Father     History   Substance Use Topics   .  Smoking status:  Current Every Day Smoker -- 0.25 packs/day   .  Smokeless tobacco:  Never Used   .   Alcohol Use:  No   Smokes 1 cigarette/day - 3 years (1/2 ppd at highest)  Interested in quitting  OB History    Grav  Para  Term  Preterm  Abortions  TAB  SAB  Ect  Mult  Living    4  1  1   2   2    1      Two miscarriages in May 2012 and Oct 2012 - unsure how far along when these happened  Her prior delivery was born full term, vaginal delivery. Was on bedrest at end of pregnancy for cramping. [facility].  Review of Systems per HPI. Denies rashes. + nausea, no vomiting. Denies vaginal bleeding or discharge.  Allergies   Tomato - break out in hives  No other allergies  Home Medications    Current Outpatient Rx   Name   Route   Sig   Dispense   Refill   .  ibuprofen (ADVIL,MOTRIN) 200 MG tablet   Oral   Take 400 mg by mouth every 6 (six) hours  as needed for headache.       .  ranitidine (ZANTAC) 150 MG tablet   Oral   Take 1 tablet (150 mg total) by mouth 2 (two) times daily.   60 tablet   1    BP 114/70  Pulse 95  Temp(Src) 98.1 F (36.7 C) (Oral)  Resp 18  Ht 6\' 2"  (1.88 m)  Wt 225 lb 12.8 oz (102.422 kg)  BMI 28.98 kg/m2  LMP 12/07/2012  Physical Exam  Gen: NAD  HEENT: MMM, normocephalic. Facial hair present on chin.  Heart: RRR  Lungs: NWOB, CTAB  Abd: mildly tender to palpation in LUQ, otherwise NTTP, nondistended. Gravid.  Ext: no appreciable pitting edema bilaterally  Neuro: nonfocal, speech intact  GU: external genitalia normal appearing. Vagina with abundant thick white discharge. Cervix difficult to visualize secondary to redundant vaginal tissue.  Cervix: fingertip, slightly shortened per Georges Mouse, CNM's exam  MAU Course   Procedures (including critical care time)  Results for orders placed during the hospital encounter of 02/01/13 (from the past 24 hour(s))   URINALYSIS, ROUTINE W REFLEX MICROSCOPIC Status: Abnormal    Collection Time    02/01/13 10:35 AM   Result  Value  Range    Color, Urine  YELLOW  YELLOW    APPearance  HAZY (*)  CLEAR    Specific  Gravity, Urine  1.015  1.005 - 1.030    pH  7.0  5.0 - 8.0    Glucose, UA  NEGATIVE  NEGATIVE mg/dL    Hgb urine dipstick  NEGATIVE  NEGATIVE    Bilirubin Urine  NEGATIVE  NEGATIVE    Ketones, ur  15 (*)  NEGATIVE mg/dL    Protein, ur  NEGATIVE  NEGATIVE mg/dL    Urobilinogen, UA  1.0  0.0 - 1.0 mg/dL    Nitrite  NEGATIVE  NEGATIVE    Leukocytes, UA  NEGATIVE  NEGATIVE   WET PREP, GENITAL Status: Abnormal    Collection Time    02/01/13 11:50 AM   Result  Value  Range    Yeast Wet Prep HPF POC  NONE SEEN  NONE SEEN    Trich, Wet Prep  NONE SEEN  NONE SEEN    Clue Cells Wet Prep HPF POC  NONE SEEN  NONE SEEN    WBC, Wet Prep HPF POC  FEW (*)  NONE SEEN   FETAL FIBRONECTIN Status: None    Collection Time    02/01/13 11:50 AM   Result  Value  Range    Fetal Fibronectin  NEGATIVE  NEGATIVE    US Ob Transvaginal  02/01/2013 *RADIOLOGY REPORT* Clinical Data: Cervical length assessment. Cramping. Pregnancy. TRANSVAGINAL OB ULTRASOUND LIMITED Technique: Transvaginal ultrasound was performed for evaluation of the cervix. Comparison: 01/23/2013 Findings: There is a single living pregnancy in breech presentation with cardiac activity 149 beats per minute and the amount of amniotic fluid subjectively within normal limits. Cervical length 3.3 cm. No change with Valsalva maneuver. IMPRESSION: 1. Cervix remains a long, thick, and closed, with cervical length 3.3 cm. Several images were obtained depicting a single living intrauterine pregnancy with heart rate at 149 beats per minute. Original Report Authenticated By: Gaylyn Rong, M.D.   1.  Abdominal pain in pregnancy, antepartum    FHR: poor tracing given gestational age but baseline 150s, moderate variability, +accels, no decels  Toco: no contractions seen  MDM   Claire Riley is a 27 y.o. [id number] at [redacted]w[redacted]d who presents with abdominal pain.  UA, wet prep unremarkable. Fetal fibronectin is negative. No contractions seen on tocometry. FHR  reassuring. Ultrasound shows cervix to be long, thick, closed with length of 3.3cm.  GI cocktail given which improved pt's symptoms. Has f/u appt scheduled for 4/14.  Stable for discharge to home.  Levert Feinstein, MD  Latrelle Dodrill, MD  02/01/13 1342   I saw and examined patient along with student and agree with above note.  FRAZIER,NATALIE  02/01/2013  3:16 PM   I have reviewed the Resident's note and chart, and I agree with the management and plan.  [name], MD, FACOG Attending Obstetrician & Gynecologist Faculty Practice, [facility]

## 2013-02-01 NOTE — ED Provider Notes (Deleted)
History     CSN: [id number]  Arrival date & time 02/01/13  1025   None     Chief Complaint  Patient presents with  . Contractions    HPI  Claire Riley is a 27 y.o. [id number] at [redacted]w[redacted]d by a [redacted]w[redacted]d u/s who presents complaining of painful upper abdominal cramping since 2:30am this morning. The cramping comes and goes. It lasts 1-2 minutes with about 1.5 minutes in between. Last perceived fetal movement is right now. No bleeding or fluid leaking. Pt says the cramping feels the same as contractions. It's located over her upper/mid abdomen across the top. She found out she was pregnant less than two weeks ago and has an appointment with the [facility] hospital outpatient clinic on the 14th of April. Has not yet had a prenatal visit. Yesterday felt some pressure in her pelvis, and this feeling continues today.  Denies vaginal discharge. Has had some odor. Last sexual intercourse was 3 weeks ago. Has had two female partners in the last year.  Denies headache or vision changes. Did feel like her feet & ankles were swollen yesterday.  Dating: last reported period was December 07, 2012 and was three days in duration but an u/s on 01/23/13 showed an EDD of 06/28/13.  Past Medical History  Diagnosis Date  . UTI (lower urinary tract infection)   . Bronchial asthma   . Gonorrhea   . Pneumonia   . Depression   . Headache   . Trichomonas   Had asthma (occasional attacks - 1-2x/month)  Medications: No medicines Not on PNV (can't afford OTC)  Past Surgical History  Procedure Laterality Date  . Wisdom tooth extraction      2007    Family History  Problem Relation Age of Onset  . Hypertension Father   . Hypertension Mother   . Diabetes Father   . Diabetes Paternal Grandmother   . Scoliosis Mother   . Scoliosis Sister   . COPD Father    History  Substance Use Topics  . Smoking status: Current Every Day Smoker -- 0.25 packs/day  . Smokeless tobacco: Never Used  . Alcohol Use: No  Smokes 1  cigarette/day - 3 years (1/2 ppd at highest) Interested in quitting  OB History   Grav Para Term Preterm Abortions TAB SAB Ect Mult Living   4 1 1  2  2   1     Two miscarriages in May 2012 and Oct 2012 - unsure how far along when these happened Her prior delivery was born full term, vaginal delivery. Was on bedrest at end of pregnancy for cramping. [facility] at [facility].  Review of Systems per HPI. Denies rashes. + nausea, no vomiting. Denies vaginal bleeding or discharge.   Allergies  Tomato - break out in hives No other allergies  Home Medications   Current Outpatient Rx  Name  Route  Sig  Dispense  Refill  . ibuprofen (ADVIL,MOTRIN) 200 MG tablet   Oral   Take 400 mg by mouth every 6 (six) hours as needed for headache.          . ranitidine (ZANTAC) 150 MG tablet   Oral   Take 1 tablet (150 mg total) by mouth 2 (two) times daily.   60 tablet   1     BP 114/70  Pulse 95  Temp(Src) 98.1 F (36.7 C) (Oral)  Resp 18  Ht 6\' 2"  (1.88 m)  Wt 225 lb 12.8 oz (102.422 kg)  BMI 28.98 kg/m2  LMP 12/07/2012  Physical Exam Gen: NAD HEENT: MMM, normocephalic. Facial hair present on chin. Heart: RRR Lungs: NWOB, CTAB Abd: mildly tender to palpation in LUQ, otherwise NTTP, nondistended. Gravid. Ext: no appreciable pitting edema bilaterally Neuro: nonfocal, speech intact GU: external genitalia normal appearing. Vagina with abundant thick white discharge. Cervix difficult to visualize secondary to redundant vaginal tissue. Cervix: fingertip, slightly shortened per Georges Mouse, CNM's exam   MAU Course  Procedures (including critical care time) Results for orders placed during the hospital encounter of 02/01/13 (from the past 24 hour(s))  URINALYSIS, ROUTINE W REFLEX MICROSCOPIC     Status: Abnormal   Collection Time    02/01/13 10:35 AM      Result Value Range   Color, Urine YELLOW  YELLOW   APPearance HAZY (*) CLEAR   Specific Gravity, Urine 1.015  1.005 - 1.030   pH 7.0   5.0 - 8.0   Glucose, UA NEGATIVE  NEGATIVE mg/dL   Hgb urine dipstick NEGATIVE  NEGATIVE   Bilirubin Urine NEGATIVE  NEGATIVE   Ketones, ur 15 (*) NEGATIVE mg/dL   Protein, ur NEGATIVE  NEGATIVE mg/dL   Urobilinogen, UA 1.0  0.0 - 1.0 mg/dL   Nitrite NEGATIVE  NEGATIVE   Leukocytes, UA NEGATIVE  NEGATIVE  WET PREP, GENITAL     Status: Abnormal   Collection Time    02/01/13 11:50 AM      Result Value Range   Yeast Wet Prep HPF POC NONE SEEN  NONE SEEN   Trich, Wet Prep NONE SEEN  NONE SEEN   Clue Cells Wet Prep HPF POC NONE SEEN  NONE SEEN   WBC, Wet Prep HPF POC FEW (*) NONE SEEN  FETAL FIBRONECTIN     Status: None   Collection Time    02/01/13 11:50 AM      Result Value Range   Fetal Fibronectin NEGATIVE  NEGATIVE    US Ob Transvaginal  02/01/2013  *RADIOLOGY REPORT*  Clinical Data: Cervical length assessment.  Cramping.  Pregnancy.  TRANSVAGINAL OB ULTRASOUND LIMITED  Technique:  Transvaginal ultrasound was performed for evaluation of the cervix.  Comparison: 01/23/2013  Findings: There is a single living pregnancy in breech presentation with cardiac activity 149 beats per minute and the amount of amniotic fluid subjectively within normal limits.  Cervical length 3.3 cm.  No change with Valsalva maneuver.  IMPRESSION:  1.  Cervix remains a long, thick, and closed, with cervical length 3.3 cm.  Several images were obtained depicting a single living intrauterine pregnancy with heart rate at 149 beats per minute.   Original Report Authenticated By: Gaylyn Rong, M.D.      1. Abdominal pain in pregnancy, antepartum     FHR: poor tracing given gestational age but baseline 150s, moderate variability, +accels, no decels Toco: no contractions seen   MDM  Claire Riley is a 27 y.o. [phone] at [redacted]w[redacted]d who presents with abdominal pain. UA, wet prep unremarkable. Fetal fibronectin is negative. No contractions seen on tocometry.  FHR reassuring. Ultrasound shows cervix to be long, thick,  closed with length of 3.3cm.  GI cocktail given which improved pt's symptoms. Has f/u appt scheduled for 4/14.  Stable for discharge to home.  Levert Feinstein, MD  Latrelle Dodrill, MD 02/01/13 1342  I saw and examined patient along with student and agree with above note.   Deysha Cartier 02/01/2013 3:16 PM

## 2013-02-04 ENCOUNTER — Encounter (HOSPITAL_COMMUNITY): Payer: Self-pay | Admitting: *Deleted

## 2013-02-04 ENCOUNTER — Inpatient Hospital Stay (HOSPITAL_COMMUNITY)
Admission: AD | Admit: 2013-02-04 | Discharge: 2013-02-04 | Disposition: A | Discharge status: Home / Self Care | Payer: Medicaid Other | Source: Ambulatory Visit | Attending: Obstetrics & Gynecology | Admitting: Obstetrics & Gynecology

## 2013-02-04 DIAGNOSIS — R109 Unspecified abdominal pain: Secondary | ICD-10-CM | POA: Insufficient documentation

## 2013-02-04 DIAGNOSIS — O368190 Decreased fetal movements, unspecified trimester, not applicable or unspecified: Secondary | ICD-10-CM | POA: Insufficient documentation

## 2013-02-04 LAB — URINALYSIS, ROUTINE W REFLEX MICROSCOPIC
Glucose, UA: NEGATIVE mg/dL
Hgb urine dipstick: NEGATIVE
Leukocytes, UA: NEGATIVE
Specific Gravity, Urine: 1.025 (ref 1.005–1.030)
pH: 6 (ref 5.0–8.0)

## 2013-02-04 NOTE — MAU Provider Note (Signed)
History     CSN: [id number]  Arrival date & time 02/04/13  2049   None     Chief Complaint  Patient presents with  . Decreased Fetal Movement  . Abdominal Pain    HPI Pt came to MAU for decreased fetal movement. No fetal movement since 00:30 today. Tried changing sides, pushing on belly w/o success. Decided to come in today after work for evaluation. NO PNC at this time. No PNV. Vaginal pressure intermittently throughout the day for 1 day. Denies vaginal bleedin/discharge, LOF, contractions, HA, LE swelling, RUQ pain.    Past Medical History  Diagnosis Date  . UTI (lower urinary tract infection)   . Bronchial asthma   . Gonorrhea   . Pneumonia   . Depression   . Headache   . Trichomonas     Past Surgical History  Procedure Laterality Date  . Wisdom tooth extraction      2007    Family History  Problem Relation Age of Onset  . Hypertension Father   . Hypertension Mother   . Diabetes Father   . Diabetes Paternal Grandmother   . Scoliosis Mother   . Scoliosis Sister   . COPD Father     History  Substance Use Topics  . Smoking status: Current Every Day Smoker -- 0.25 packs/day  . Smokeless tobacco: Never Used  . Alcohol Use: No    OB History   Grav Para Term Preterm Abortions TAB SAB Ect Mult Living   4 1 1  2  2   1       Review of Systems  Constitutional: Negative for fever.  Respiratory: Negative for shortness of breath.   Cardiovascular: Negative for chest pain, palpitations and leg swelling.  Gastrointestinal: Negative for nausea, vomiting, abdominal pain, diarrhea and constipation.  Genitourinary: Negative for dysuria.  Neurological: Negative for dizziness and headaches.  All other systems reviewed and are negative.    Allergies  Tomato  Home Medications  No current outpatient prescriptions on file.  BP 118/77  Pulse 77  Temp(Src) 98.1 F (36.7 C) (Oral)  Resp 18  Ht 6\' 2"  (1.88 m)  Wt 104.418 kg (230 lb 3.2 oz)  BMI 29.54 kg/m2   SpO2 100%  LMP 12/07/2012  Physical Exam  Constitutional: She is oriented to person, place, and time. She appears well-developed and well-nourished. No distress.  HENT:  Head: Normocephalic.  Eyes: EOM are normal.  Neck: Normal range of motion.  Cardiovascular: Normal rate.   Pulmonary/Chest: Effort normal.  Abdominal: Soft. She exhibits no distension.  Genitourinary: Vagina normal and uterus normal. No vaginal discharge found.  Cervix closed/thick/long No bleeding  Musculoskeletal: Normal range of motion.  Neurological: She is alert and oriented to person, place, and time.  Skin: Skin is warm. No rash noted.  Psychiatric: She has a normal mood and affect. Her behavior is normal.    MAU Course  Procedures (including critical care time)  Labs Reviewed  URINALYSIS, ROUTINE W REFLEX MICROSCOPIC - Abnormal; Notable for the following:    Ketones, ur 15 (*)    All other components within normal limits   No results found.   No diagnosis found.    MDM  27yo [id number] at 23.3 w/o PNC presenting for decreased fetal movement. Category 1 tracing and no contractions. Cervix closed/thick/long. Stable for discharge - Precautions reviewed - Pt will start [facility] at clinic this coming week    Shelly Flatten, MD Family Medicine PGY-2 02/04/2013, 10:44 PM   .I  have seen the patient with the resident/student and agree with the above.  Tawnya Crook

## 2013-02-04 NOTE — MAU Note (Signed)
No fetal movement since midnight last night. Lower abdominal pressure that comes & goes since yesterday. Denies vaginal bleeding, leaking of fluid or vaginal discharge.

## 2013-02-09 ENCOUNTER — Ambulatory Visit (INDEPENDENT_AMBULATORY_CARE_PROVIDER_SITE_OTHER): Payer: Medicaid Other | Admitting: Obstetrics & Gynecology

## 2013-02-09 ENCOUNTER — Encounter: Payer: Self-pay | Admitting: Obstetrics & Gynecology

## 2013-02-09 VITALS — BP 117/72 | Temp 97.3°F | Wt 232.0 lb

## 2013-02-09 DIAGNOSIS — O0930 Supervision of pregnancy with insufficient antenatal care, unspecified trimester: Secondary | ICD-10-CM | POA: Insufficient documentation

## 2013-02-09 DIAGNOSIS — Z124 Encounter for screening for malignant neoplasm of cervix: Secondary | ICD-10-CM

## 2013-02-09 DIAGNOSIS — Z23 Encounter for immunization: Secondary | ICD-10-CM

## 2013-02-09 DIAGNOSIS — O0932 Supervision of pregnancy with insufficient antenatal care, second trimester: Secondary | ICD-10-CM

## 2013-02-09 LAB — POCT URINALYSIS DIP (DEVICE)
Bilirubin Urine: NEGATIVE
Glucose, UA: NEGATIVE mg/dL
Hgb urine dipstick: NEGATIVE
Ketones, ur: NEGATIVE mg/dL
Specific Gravity, Urine: 1.025 (ref 1.005–1.030)
Urobilinogen, UA: 4 mg/dL — ABNORMAL HIGH (ref 0.0–1.0)

## 2013-02-09 MED ORDER — TETANUS-DIPHTH-ACELL PERTUSSIS 5-2.5-18.5 LF-MCG/0.5 IM SUSP: 0.5000 mL | Freq: Once | INTRAMUSCULAR | Status: DC

## 2013-02-09 NOTE — Patient Instructions (Addendum)
Pregnancy - Third Trimester The third trimester of pregnancy (the last 3 months) is a period of the most rapid growth for you and your baby. The baby approaches a length of 20 inches and a weight of 6 to 10 pounds. The baby is adding on fat and getting ready for life outside your body. While inside, babies have periods of sleeping and waking, suck their thumbs, and hiccups. You can often feel small contractions of the uterus. This is false labor. It is also called Braxton-Hicks contractions. This is like a practice for labor. The usual problems in this stage of pregnancy include more difficulty breathing, swelling of the hands and feet from water retention, and having to urinate more often because of the uterus and baby pressing on your bladder.  PRENATAL EXAMS  Blood work may continue to be done during prenatal exams. These tests are done to check on your health and the probable health of your baby. Blood work is used to follow your blood levels (hemoglobin). Anemia (low hemoglobin) is common during pregnancy. Iron and vitamins are given to help prevent this. You may also continue to be checked for diabetes. Some of the past blood tests may be done again.  The size of the uterus is measured during each visit. This makes sure your baby is growing properly according to your pregnancy dates.  Your blood pressure is checked every prenatal visit. This is to make sure you are not getting toxemia.  Your urine is checked every prenatal visit for infection, diabetes and protein.  Your weight is checked at each visit. This is done to make sure gains are happening at the suggested rate and that you and your baby are growing normally.  Sometimes, an ultrasound is performed to confirm the position and the proper growth and development of the baby. This is a test done that bounces harmless sound waves off the baby so your caregiver can more accurately determine due dates.  Discuss the type of pain medication  and anesthesia you will have during your labor and delivery.  Discuss the possibility and anesthesia if a Cesarean Section might be necessary.  Inform your caregiver if there is any mental or physical violence at home. Sometimes, a specialized non-stress test, contraction stress test and biophysical profile are done to make sure the baby is not having a problem. Checking the amniotic fluid surrounding the baby is called an amniocentesis. The amniotic fluid is removed by sticking a needle into the belly (abdomen). This is sometimes done near the end of pregnancy if an early delivery is required. In this case, it is done to help make sure the baby's lungs are mature enough for the baby to live outside of the womb. If the lungs are not mature and it is unsafe to deliver the baby, an injection of cortisone medication is given to the mother 1 to 2 days before the delivery. This helps the baby's lungs mature and makes it safer to deliver the baby. CHANGES OCCURING IN THE THIRD TRIMESTER OF PREGNANCY Your body goes through many changes during pregnancy. They vary from person to person. Talk to your caregiver about changes you notice and are concerned about.  During the last trimester, you have probably had an increase in your appetite. It is normal to have cravings for certain foods. This varies from person to person and pregnancy to pregnancy.  You may begin to get stretch marks on your hips, abdomen, and breasts. These are normal changes in the body  during pregnancy. There are no exercises or medications to take which prevent this change.  Constipation may be treated with a stool softener or adding bulk to your diet. Drinking lots of fluids, fiber in vegetables, fruits, and whole grains are helpful.  Exercising is also helpful. If you have been very active up until your pregnancy, most of these activities can be continued during your pregnancy. If you have been less active, it is helpful to start an  exercise program such as walking. Consult your caregiver before starting exercise programs.  Avoid all smoking, alcohol, un-prescribed drugs, herbs and "street drugs" during your pregnancy. These chemicals affect the formation and growth of the baby. Avoid chemicals throughout the pregnancy to ensure the delivery of a healthy infant.  Backache, varicose veins and hemorrhoids may develop or get worse.  You will tire more easily in the third trimester, which is normal.  The baby's movements may be stronger and more often.  You may become short of breath easily.  Your belly button may stick out.  A yellow discharge may leak from your breasts called colostrum.  You may have a bloody mucus discharge. This usually occurs a few days to a week before labor begins. HOME CARE INSTRUCTIONS   Keep your caregiver's appointments. Follow your caregiver's instructions regarding medication use, exercise, and diet.  During pregnancy, you are providing food for you and your baby. Continue to eat regular, well-balanced meals. Choose foods such as meat, fish, milk and other low fat dairy products, vegetables, fruits, and whole-grain breads and cereals. Your caregiver will tell you of the ideal weight gain.  A physical sexual relationship may be continued throughout pregnancy if there are no other problems such as early (premature) leaking of amniotic fluid from the membranes, vaginal bleeding, or belly (abdominal) pain.  Exercise regularly if there are no restrictions. Check with your caregiver if you are unsure of the safety of your exercises. Greater weight gain will occur in the last 2 trimesters of pregnancy. Exercising helps:  Control your weight.  Get you in shape for labor and delivery.  You lose weight after you deliver.  Rest a lot with legs elevated, or as needed for leg cramps or low back pain.  Wear a good support or jogging bra for breast tenderness during pregnancy. This may help if worn  during sleep. Pads or tissues may be used in the bra if you are leaking colostrum.  Do not use hot tubs, steam rooms, or saunas.  Wear your seat belt when driving. This protects you and your baby if you are in an accident.  Avoid raw meat, cat litter boxes and soil used by cats. These carry germs that can cause birth defects in the baby.  It is easier to loose urine during pregnancy. Tightening up and strengthening the pelvic muscles will help with this problem. You can practice stopping your urination while you are going to the bathroom. These are the same muscles you need to strengthen. It is also the muscles you would use if you were trying to stop from passing gas. You can practice tightening these muscles up 10 times a set and repeating this about 3 times per day. Once you know what muscles to tighten up, do not perform these exercises during urination. It is more likely to cause an infection by backing up the urine.  Ask for help if you have financial, counseling or nutritional needs during pregnancy. Your caregiver will be able to offer counseling for these  needs as well as refer you for other special needs.  Make a list of emergency phone numbers and have them available.  Plan on getting help from family or friends when you go home from the hospital.  Make a trial run to the hospital.  Take prenatal classes with the father to understand, practice and ask questions about the labor and delivery.  Prepare the baby's room/nursery.  Do not travel out of the city unless it is absolutely necessary and with the advice of your caregiver.  Wear only low or no heal shoes to have better balance and prevent falling. MEDICATIONS AND DRUG USE IN PREGNANCY  Take prenatal vitamins as directed. The vitamin should contain 1 milligram of folic acid. Keep all vitamins out of reach of children. Only a couple vitamins or tablets containing iron may be fatal to a baby or young child when  ingested.  Avoid use of all medications, including herbs, over-the-counter medications, not prescribed or suggested by your caregiver. Only take over-the-counter or prescription medicines for pain, discomfort, or fever as directed by your caregiver. Do not use aspirin, ibuprofen (Motrin, Advil, Nuprin) or naproxen (Aleve) unless OK'd by your caregiver.  Let your caregiver also know about herbs you may be using.  Alcohol is related to a number of birth defects. This includes fetal alcohol syndrome. All alcohol, in any form, should be avoided completely. Smoking will cause low birth rate and premature babies.  Street/illegal drugs are very harmful to the baby. They are absolutely forbidden. A baby born to an addicted mother will be addicted at birth. The baby will go through the same withdrawal an adult does. SEEK MEDICAL CARE IF: You have any concerns or worries during your pregnancy. It is better to call with your questions if you feel they cannot wait, rather than worry about them. DECISIONS ABOUT CIRCUMCISION You may or may not know the sex of your baby. If you know your baby is a boy, it may be time to think about circumcision. Circumcision is the removal of the foreskin of the penis. This is the skin that covers the sensitive end of the penis. There is no proven medical need for this. Often this decision is made on what is popular at the time or based upon religious beliefs and social issues. You can discuss these issues with your caregiver or pediatrician. SEEK IMMEDIATE MEDICAL CARE IF:   An unexplained oral temperature above 102 F (38.9 C) develops, or as your caregiver suggests.  You have leaking of fluid from the vagina (birth canal). If leaking membranes are suspected, take your temperature and tell your caregiver of this when you call.  There is vaginal spotting, bleeding or passing clots. Tell your caregiver of the amount and how many pads are used.  You develop a bad smelling  vaginal discharge with a change in the color from clear to white.  You develop vomiting that lasts more than 24 hours.  You develop chills or fever.  You develop shortness of breath.  You develop burning on urination.  You loose more than 2 pounds of weight or gain more than 2 pounds of weight or as suggested by your caregiver.  You notice sudden swelling of your face, hands, and feet or legs.  You develop belly (abdominal) pain. Round ligament discomfort is a common non-cancerous (benign) cause of abdominal pain in pregnancy. Your caregiver still must evaluate you.  You develop a severe headache that does not go away.  You develop visual  problems, blurred or double vision.  If you have not felt your baby move for more than 1 hour. If you think the baby is not moving as much as usual, eat something with sugar in it and lie down on your left side for an hour. The baby should move at least 4 to 5 times per hour. Call right away if your baby moves less than that.  You fall, are in a car accident or any kind of trauma.  There is mental or physical violence at home. Document Released: 10/09/2001 Document Revised: 01/07/2012 Document Reviewed: 04/13/2009 [facility] Patient Information 2013 [place], [place].  Breastfeeding Deciding to breastfeed is one of the best choices you can make for you and your baby. The information that follows gives a brief overview of the benefits of breastfeeding as well as common topics surrounding breastfeeding. BENEFITS OF BREASTFEEDING For the baby  The first milk (colostrum) helps the baby's digestive system function better.   There are antibodies in the mother's milk that help the baby fight off infections.   The baby has a lower incidence of asthma, allergies, and sudden infant death syndrome (SIDS).   The nutrients in breast milk are better for the baby than infant formulas, and breast milk helps the baby's brain grow better.   Babies who  breastfeed have less gas, colic, and constipation.  For the mother  Breastfeeding helps develop a very special bond between the mother and her baby.   Breastfeeding is convenient, always available at the correct temperature, and costs nothing.   Breastfeeding burns calories in the mother and helps her lose weight that was gained during pregnancy.   Breastfeeding makes the uterus contract back down to normal size faster and slows bleeding following delivery.   Breastfeeding mothers have a lower risk of developing breast cancer.  BREASTFEEDING FREQUENCY  A healthy, full-term baby may breastfeed as often as every hour or space his or her feedings to every 3 hours.   Watch your baby for signs of hunger. Nurse your baby if he or she shows signs of hunger. How often you nurse will vary from baby to baby.   Nurse as often as the baby requests, or when you feel the need to reduce the fullness of your breasts.   Awaken the baby if it has been 3 4 hours since the last feeding.   Frequent feeding will help the mother make more milk and will help prevent problems, such as sore nipples and engorgement of the breasts.  BABY'S POSITION AT THE BREAST  Whether lying down or sitting, be sure that the baby's tummy is facing your tummy.   Support the breast with 4 fingers underneath the breast and the thumb above. Make sure your fingers are well away from the nipple and baby's mouth.   Stroke the baby's lips gently with your finger or nipple.   When the baby's mouth is open wide enough, place all of your nipple and as much of the areola as possible into your baby's mouth.   Pull the baby in close so the tip of the nose and the baby's cheeks touch the breast during the feeding.  FEEDINGS AND SUCTION  The length of each feeding varies from baby to baby and from feeding to feeding.   The baby must suck about 2 3 minutes for your milk to get to him or her. This is called a "let down."  For this reason, allow the baby to feed on each breast as  long as he or she wants. Your baby will end the feeding when he or she has received the right balance of nutrients.   To break the suction, put your finger into the corner of the baby's mouth and slide it between his or her gums before removing your breast from his or her mouth. This will help prevent sore nipples.  HOW TO TELL WHETHER YOUR BABY IS GETTING ENOUGH BREAST MILK. Wondering whether or not your baby is getting enough milk is a common concern among mothers. You can be assured that your baby is getting enough milk if:   Your baby is actively sucking and you hear swallowing.   Your baby seems relaxed and satisfied after a feeding.   Your baby nurses at least 8 12 times in a 24 hour time period. Nurse your baby until he or she unlatches or falls asleep at the first breast (at least 10 20 minutes), then offer the second side.   Your baby is wetting 5 6 disposable diapers (6 8 cloth diapers) in a 24 hour period by 66 44 days of age.   Your baby is having at least 3 4 stools every 24 hours for the first 6 weeks. The stool should be soft and yellow.   Your baby should gain 4 7 ounces per week after he or she is 41 days old.   Your breasts feel softer after nursing.  REDUCING BREAST ENGORGEMENT  In the first week after your baby is born, you may experience signs of breast engorgement. When breasts are engorged, they feel heavy, warm, full, and may be tender to the touch. You can reduce engorgement if you:   Nurse frequently, every 2 3 hours. Mothers who breastfeed early and often have fewer problems with engorgement.   Place light ice packs on your breasts for 10 20 minutes between feedings. This reduces swelling. Wrap the ice packs in a lightweight towel to protect your skin. Bags of frozen vegetables work well for this purpose.   Take a warm shower or apply warm, moist heat to your breast for 5 10 minutes just before  each feeding. This increases circulation and helps the milk flow.   Gently massage your breast before and during the feeding. Using your finger tips, massage from the chest wall towards your nipple in a circular motion.   Make sure that the baby empties at least one breast at every feeding before switching sides.   Use a breast pump to empty the breasts if your baby is sleepy or not nursing well. You may also want to pump if you are returning to work oryou feel you are getting engorged.   Avoid bottle feeds, pacifiers, or supplemental feedings of water or juice in place of breastfeeding. Breast milk is all the food your baby needs. It is not necessary for your baby to have water or formula. In fact, to help your breasts make more milk, it is best not to give your baby supplemental feedings during the early weeks.   Be sure the baby is latched on and positioned properly while breastfeeding.   Wear a supportive bra, avoiding underwire styles.   Eat a balanced diet with enough fluids.   Rest often, relax, and take your prenatal vitamins to prevent fatigue, stress, and anemia.  If you follow these suggestions, your engorgement should improve in 24 48 hours. If you are still experiencing difficulty, call your lactation consultant or caregiver.  CARING FOR YOURSELF Take care of your  breasts  Bathe or shower daily.   Avoid using soap on your nipples.   Start feedings on your left breast at one feeding and on your right breast at the next feeding.   You will notice an increase in your milk supply 2 5 days after delivery. You may feel some discomfort from engorgement, which makes your breasts very firm and often tender. Engorgement "peaks" out within 24 48 hours. In the meantime, apply warm moist towels to your breasts for 5 10 minutes before feeding. Gentle massage and expression of some milk before feeding will soften your breasts, making it easier for your baby to latch on.    Wear a well-fitting nursing bra, and air dry your nipples for a 3 after each feeding.   Only use cotton bra pads.   Only use pure lanolin on your nipples after nursing. You do not need to wash it off before feeding the baby again. Another option is to express a few drops of breast milk and gently massage it into your nipples.  Take care of yourself  Eat well-balanced meals and nutritious snacks.   Drinking milk, fruit juice, and water to satisfy your thirst (about 8 glasses a day).   Get plenty of rest.  Avoid foods that you notice affect the baby in a bad way.  SEEK MEDICAL CARE IF:   You have difficulty with breastfeeding and need help.   You have a hard, red, sore area on your breast that is accompanied by a fever.   Your baby is too sleepy to eat well or is having trouble sleeping.   Your baby is wetting less than 6 diapers a day, by 1 days of age.   Your baby's skin or white part of his or her eyes is more yellow than it was in the hospital.   You feel depressed.  Document Released: 10/15/2005 Document Revised: 04/15/2012 Document Reviewed: 01/13/2012 [facility] Patient Information 2013 [place], [place].  Tetanus, Diphtheria (Td) or Tetanus, Diphtheria, Pertussis (Tdap) Vaccine What You Need to Know WHY GET VACCINATED? Tetanus, diphtheria and pertussis can be very serious diseases. TETANUS (Lockjaw) causes painful muscle spasms and stiffness, usually all over the body.  Tetanus can lead to tightening of muscles in the head and neck so the victim cannot open his mouth or swallow, or sometimes even breathe. Tetanus kills about 1 out of 5 people who are infected. DIPHTHERIA can cause a thick membrane to cover the back of the throat.  Diphtheria can lead to breathing problems, paralysis, heart failure, and even death. PERTUSSIS (Whooping Cough) causes severe coughing spells which can lead to difficulty breathing, vomiting, and disturbed  sleep.  Pertussis can lead to weight loss, incontinence, rib fractures and passing out from violent coughing. Up to 2 in 100 adolescents and 5 in 100 adults with pertussis are hospitalized or have complications, including pneumonia and death. These 3 diseases are all caused by bacteria. Diphtheria and pertussis are spread from person to person. Tetanus enters the body through cuts, scratches, or wounds. The [place] saw as many as 200,000 cases a year of diphtheria and pertussis before vaccines were available, and hundreds of cases of tetanus. Since then, tetanus and diphtheria cases have dropped by about 99% and pertussis cases by about 92%. Children 42 years of age and younger get DTaP vaccine to protect them from these three diseases. But older children, adolescents, and adults need protection too. VACCINES FOR ADOLESCENTS AND ADULTS: TD AND TDAP Two vaccines are  available to protect people 75 years of age and older from these diseases:  Td vaccine has been used for many years. It protects against tetanus and diphtheria.  Tdap vaccine was licensed in 2005. It is the first vaccine for adolescents and adults that protects against pertussis as well as tetanus and diphtheria. A Td booster dose is recommended every 10 years. Tdap is given only once. WHICH VACCINE, AND WHEN? Ages 7 through 18 years  A dose of Tdap is recommended at age 36 or 29. This dose could be given as early as age 28 for children who missed one or more childhood doses of DTaP.  Children and adolescents who did not get a complete series of DTaP shots by age 39 should complete the series using a combination of Td and Tdap. Age 37 years and Older  All adults should get a booster dose of Td every 10 years. Adults under 65 who have never gotten Tdap should get a dose of Tdap as their next booster dose. Adults 65 and older may get one booster dose of Tdap.  Adults (including women who may become pregnant and adults 45 and older)  who expect to have close contact with a baby younger than 16 months of age should get a dose of Tdap to help protect the baby from pertussis.  Healthcare professionals who have direct patient contact in hospitals or clinics should get one dose of Tdap. Protection After a Wound  A person who gets a severe cut or burn might need a dose of Td or Tdap to prevent tetanus infection. Tdap should be used for anyone who has never had a dose previously. Td should be used if Tdap is not available, or for:  Anybody who has already had a dose of Tdap.  Children 7 through 62 years of age who completed the childhood DTaP series.  Adults 65 and older. Pregnant Women  Pregnant women who have never had a dose of Tdap should get one, after the 20th week of gestation and preferably during the 3rd trimester. If they do not get Tdap during their pregnancy they should get a dose as soon as possible after delivery. Pregnant women who have previously received Tdap and need tetanus or diphtheria vaccine while pregnant should get Td. Tdap and Td may be given at the same time as other vaccines. SOME PEOPLE SHOULD NOT BE VACCINATED OR SHOULD WAIT  Anyone who has had a life-threatening allergic reaction after a dose of any tetanus, diphtheria, or pertussis containing vaccine should not get Td or Tdap.  Anyone who has a severe allergy to any component of a vaccine should not get that vaccine. Tell your doctor if the person getting the vaccine has any severe allergies.  Anyone who had a coma, or long or multiple seizures within 7 days after a dose of DTP or DTaP should not get Tdap, unless a cause other than the vaccine was found. These people may get Td.  Talk to your doctor if the person getting either vaccine:  Has epilepsy or another nervous system problem.  Had severe swelling or severe pain after a previous dose of DTP, DTaP, DT, Td, or Tdap vaccine.  Has had Guillain Barr Syndrome (GBS). Anyone who has a  moderate or severe illness on the day the shot is scheduled should usually wait until they recover before getting Tdap or Td vaccine. A person with a mild illness or low fever can usually be vaccinated. WHAT ARE THE RISKS FROM  TDAP AND TD VACCINES? With a vaccine, as with any medicine, there is always a small risk of a life-threatening allergic reaction or other serious problem. Brief fainting spells and related symptoms (such as jerking movements) can happen after any medical procedure, including vaccination. Sitting or lying down for about 15 minutes after a vaccination can help prevent fainting and injuries caused by falls. Tell your doctor if the patient feels dizzy or lightheaded, or has vision changes or ringing in the ears. Getting tetanus, diphtheria, or pertussis would be much more likely to lead to severe problems than getting either Td or Tdap vaccine. Problems reported after Td and Tdap vaccines are listed below. Mild Problems (noticeable, but did not interfere with activities) Tdap  Pain (about 3 in 4 adolescents and 2 in 3 adults).  Redness or swelling (about 1 in 5).  Mild fever of at least 100.4 F (38 C) (up to about 1 in 25 adolescents and 1 in 100 adults).  Headache (about 4 in 10 adolescents and 3 in 10 adults).  Tiredness (about 1 in 3 adolescents and 1 in 4 adults).  Nausea, vomiting, diarrhea, or stomach ache (up to 1 in 4 adolescents and 1 in 10 adults).  Chills, body aches, sore joints, rash, or swollen glands (uncommon). Td  Pain (up to about 8 in 10).  Redness or swelling at the injection site (up to about 1 in 3).  Mild fever (up to about 1 in 15).  Headache or tiredness (uncommon). Moderate Problems (interfered with activities, but did not require medical attention) Tdap  Pain at the injection site (about 1 in 20 adolescents and 1 in 100 adults).  Redness or swelling at the injection site (up to about 1 in 16 adolescents and 1 in 25 adults).  Fever  over 102 F (38.9 C) (about 1 in 100 adolescents and 1 in 250 adults).  Headache (1 in 300).  Nausea, vomiting, diarrhea, or stomach ache (up to 3 in 100 adolescents and 1 in 100 adults). Td  Fever over 102 F (38.9 C) (rare). Tdap or Td  Extensive swelling of the arm where the shot was given (up to about 3 in 100). Severe Problems (Unable to perform usual activities; required medical attention) Tdap or Td  Swelling, severe pain, bleeding, and redness in the arm where the shot was given (rare). A severe allergic reaction could occur after any vaccine. They are estimated to occur less than once in a million doses. WHAT IF THERE IS A SEVERE REACTION? What should I look for? Any unusual condition, such as a severe allergic reaction or a high fever. If a severe allergic reaction occurred, it would be within a few minutes to an hour after the shot. Signs of a serious allergic reaction can include difficulty breathing, weakness, hoarseness or wheezing, a fast heartbeat, hives, dizziness, paleness, or swelling of the throat. What should I do?  Call a doctor, or get the person to a doctor right away.  Tell your doctor what happened, the date and time it happened, and when the vaccination was given.  Ask your provider to report the reaction by filing a Vaccine Adverse Event Reporting System (VAERS) form. Or, you can file this report through the VAERS website at www.vaers.LAgents.no or by calling 1-6823722289. VAERS does not provide medical advice. THE NATIONAL VACCINE INJURY COMPENSATION PROGRAM The National Vaccine Injury Compensation Program (VICP) was created in 1986. Persons who believe they may have been injured by a vaccine can learn about  the program and about filing a claim by calling 1-256-415-0061 or visiting the VICP website at SpiritualWord.at. HOW CAN I LEARN MORE?  Your doctor can give you the vaccine package insert or suggest other sources of  information.  Call your local or state health department.  Contact the Centers for Disease Control and Prevention (CDC):  Call 3473077395 (1-800-CDC-INFO).  Visit the [facility] website at PicCapture.uy. CDC Td and Tdap Interim VIS (11/21/10) Document Released: 08/12/2006 Document Revised: 01/07/2012 Document Reviewed: 11/21/2010 [facility] Patient Information 2013 [place], [place].

## 2013-02-09 NOTE — Progress Notes (Signed)
Nutrition note: 1st visit consult Pt has gained 18# @ [redacted]w[redacted]d, which is > expected. Pt reports eating 2 meals & 2-3 snacks/d. Pt reports having some heartburn but no N&V.  Pt is not taking PNV but plans to start today. Pt received verbal & written education on general nutrition during pregnancy. Disc tips to decrease heartburn. Disc importance of PNV. Disc wt gain goals of 15-25# or 0.6#/wk. Pt agrees to start taking PNV. Pt does not have WIC but plans to apply. Pt plans to BF. F/u if referred Blondell Reveal, MS, RD, LDN

## 2013-02-09 NOTE — Progress Notes (Signed)
Pulse 91 Edema trace in BLE. C/O pressure across abdomen.

## 2013-02-09 NOTE — Progress Notes (Signed)
   Subjective:    Claire Riley is a [id number] [redacted]w[redacted]d being seen today for her first obstetrical visit.  Her obstetrical history is significant for previous term SVD. Patient does intend to breast feed. Pregnancy history fully reviewed.  Patient reports no complaints.  Filed Vitals:   02/09/13 0928  BP: 117/72  Temp: 97.3 F (36.3 C)  Weight: 232 lb (105.235 kg)    HISTORY: OB History   Grav Para Term Preterm Abortions TAB SAB Ect Mult Living   4 1 1  2  2   1      # Outc Date GA Lbr Len/2nd Wgt Sex Del Anes PTL Lv   1 TRM            2 SAB            3 SAB            4 CUR              Past Medical History  Diagnosis Date  . UTI (lower urinary tract infection)   . Bronchial asthma   . Gonorrhea   . Pneumonia   . Depression   . Headache   . Trichomonas    Past Surgical History  Procedure Laterality Date  . Wisdom tooth extraction      2007   Family History  Problem Relation Age of Onset  . Hypertension Father   . Hypertension Mother   . Diabetes Father   . Diabetes Paternal Grandmother   . Scoliosis Mother   . Scoliosis Sister   . COPD Father      Exam    Uterus:     Pelvic Exam:    Perineum: No Hemorrhoids, Normal Perineum   Vulva: normal   Vagina:  normal mucosa, normal discharge   Cervix: multiparous appearance and no bleeding following Pap   Adnexa: normal adnexa   Bony Pelvis: average  System: Breast:  normal appearance, no masses or tenderness   Skin: normal coloration and turgor, no rashes   Neurologic: oriented, normal   Extremities: normal strength, tone, and muscle mass   HEENT PERRLA and extra ocular movement intact   Mouth/Teeth mucous membranes moist, pharynx normal without lesions and dental hygiene good   Neck supple and no masses   Cardiovascular: regular rate and rhythm   Respiratory:  appears well, vitals normal, no respiratory distress, acyanotic, normal RR   Abdomen: soft, non-tender; bowel sounds normal; no masses,  no  organomegaly   Urinary: urethral meatus normal      Assessment:    Pregnancy: [id number] Patient Active Problem List  Diagnosis  . Late prenatal care        Plan:     Continue Prenatal vitamins. Initial labs reviewed; 1 hr GTT today TDaP vaccine given Problem list reviewed and updated. Follow up in 4 weeks.  Saud Bail A 02/09/2013

## 2013-02-10 ENCOUNTER — Encounter: Payer: Self-pay | Admitting: Obstetrics & Gynecology

## 2013-02-10 LAB — GLUCOSE TOLERANCE, 1 HOUR (50G) W/O FASTING: Glucose, 1 Hour GTT: 71 mg/dL (ref 70–140)

## 2013-02-18 ENCOUNTER — Encounter: Payer: Self-pay | Admitting: *Deleted

## 2013-03-09 ENCOUNTER — Ambulatory Visit (INDEPENDENT_AMBULATORY_CARE_PROVIDER_SITE_OTHER): Payer: Medicaid Other | Admitting: Obstetrics and Gynecology

## 2013-03-09 ENCOUNTER — Encounter: Payer: Self-pay | Admitting: Obstetrics and Gynecology

## 2013-03-09 VITALS — BP 121/75 | Temp 97.9°F | Wt 237.7 lb

## 2013-03-09 DIAGNOSIS — O0930 Supervision of pregnancy with insufficient antenatal care, unspecified trimester: Secondary | ICD-10-CM

## 2013-03-09 LAB — CBC
HCT: 30.1 % — ABNORMAL LOW (ref 36.0–46.0)
Hemoglobin: 10.1 g/dL — ABNORMAL LOW (ref 12.0–15.0)
MCHC: 33.6 g/dL (ref 30.0–36.0)
RDW: 13.6 % (ref 11.5–15.5)
WBC: 9.1 10*3/uL (ref 4.0–10.5)

## 2013-03-09 LAB — POCT URINALYSIS DIP (DEVICE)
Bilirubin Urine: NEGATIVE
Glucose, UA: NEGATIVE mg/dL
Hgb urine dipstick: NEGATIVE
Specific Gravity, Urine: 1.015 (ref 1.005–1.030)
Urobilinogen, UA: 1 mg/dL (ref 0.0–1.0)

## 2013-03-09 MED ORDER — FLUCONAZOLE 150 MG PO TABS: 150.0000 mg | ORAL_TABLET | Freq: Once | ORAL | Status: DC

## 2013-03-09 NOTE — Progress Notes (Signed)
Patient doing well. She reports excessive white, pruritic discharge- Exam demonstrates curd-like discharge consistent with yeast infection. Diflucan prescribed. Patient has been taking ibuprofen prn for headaches. Recommended to change to tylenol and inform us if headaches are not well controlled.

## 2013-03-09 NOTE — Progress Notes (Signed)
Pulse- 109  Edema-"from knee to feet"  Pressure- pelvic  Vaginal discharge- white, creamy, itchy, no odor

## 2013-03-09 NOTE — Addendum Note (Signed)
Addended by: Franchot Mimes on: 03/09/2013 02:26 PM   Modules accepted: Orders

## 2013-03-10 ENCOUNTER — Encounter: Payer: Self-pay | Admitting: Obstetrics and Gynecology

## 2013-03-10 LAB — GLUCOSE TOLERANCE, 1 HOUR (50G) W/O FASTING: Glucose, 1 Hour GTT: 77 mg/dL (ref 70–140)

## 2013-03-10 LAB — WET PREP, GENITAL
Clue Cells Wet Prep HPF POC: NONE SEEN
Trich, Wet Prep: NONE SEEN

## 2013-03-10 LAB — RPR

## 2013-03-11 ENCOUNTER — Other Ambulatory Visit: Payer: Self-pay | Admitting: General Practice

## 2013-03-11 MED ORDER — FLUCONAZOLE 150 MG PO TABS: 150.0000 mg | ORAL_TABLET | Freq: Once | ORAL | Status: DC

## 2013-03-30 ENCOUNTER — Encounter: Payer: Self-pay | Admitting: Obstetrics and Gynecology

## 2013-03-30 ENCOUNTER — Ambulatory Visit (INDEPENDENT_AMBULATORY_CARE_PROVIDER_SITE_OTHER): Payer: Medicaid Other | Admitting: Obstetrics and Gynecology

## 2013-03-30 VITALS — BP 115/73 | Wt 237.7 lb

## 2013-03-30 DIAGNOSIS — O0933 Supervision of pregnancy with insufficient antenatal care, third trimester: Secondary | ICD-10-CM

## 2013-03-30 DIAGNOSIS — O0930 Supervision of pregnancy with insufficient antenatal care, unspecified trimester: Secondary | ICD-10-CM

## 2013-03-30 LAB — POCT URINALYSIS DIP (DEVICE)
Bilirubin Urine: NEGATIVE
Glucose, UA: NEGATIVE mg/dL
Hgb urine dipstick: NEGATIVE
Nitrite: NEGATIVE
Urobilinogen, UA: 1 mg/dL (ref 0.0–1.0)
pH: 7.5 (ref 5.0–8.0)

## 2013-03-30 NOTE — Progress Notes (Signed)
Patient doing well without complaints. FM/PTL precautions reviewed 

## 2013-03-30 NOTE — Progress Notes (Signed)
Pulse: 100

## 2013-04-01 ENCOUNTER — Encounter (HOSPITAL_COMMUNITY): Payer: Self-pay | Admitting: *Deleted

## 2013-04-01 ENCOUNTER — Inpatient Hospital Stay (HOSPITAL_COMMUNITY)
Admission: AD | Admit: 2013-04-01 | Discharge: 2013-04-01 | Disposition: A | Discharge status: Home / Self Care | Payer: Medicaid Other | Source: Ambulatory Visit | Attending: Obstetrics & Gynecology | Admitting: Obstetrics & Gynecology

## 2013-04-01 DIAGNOSIS — O26899 Other specified pregnancy related conditions, unspecified trimester: Secondary | ICD-10-CM

## 2013-04-01 DIAGNOSIS — O4700 False labor before 37 completed weeks of gestation, unspecified trimester: Secondary | ICD-10-CM | POA: Insufficient documentation

## 2013-04-01 DIAGNOSIS — N949 Unspecified condition associated with female genital organs and menstrual cycle: Secondary | ICD-10-CM | POA: Insufficient documentation

## 2013-04-01 DIAGNOSIS — O0930 Supervision of pregnancy with insufficient antenatal care, unspecified trimester: Secondary | ICD-10-CM

## 2013-04-01 LAB — URINALYSIS, ROUTINE W REFLEX MICROSCOPIC
Bilirubin Urine: NEGATIVE
Glucose, UA: NEGATIVE mg/dL
Ketones, ur: NEGATIVE mg/dL
Leukocytes, UA: NEGATIVE
Nitrite: NEGATIVE
Specific Gravity, Urine: 1.015 (ref 1.005–1.030)
pH: 8 (ref 5.0–8.0)

## 2013-04-01 MED ORDER — FAMOTIDINE 20 MG PO TABS
40.0000 mg | ORAL_TABLET | Freq: Once | ORAL | Status: AC
  Administered 2013-04-01: 40 mg via ORAL
  Filled 2013-04-01: qty 2

## 2013-04-01 MED ORDER — OXYCODONE-ACETAMINOPHEN 5-325 MG PO TABS
1.0000 | ORAL_TABLET | Freq: Once | ORAL | Status: AC
  Administered 2013-04-01: 1 via ORAL
  Filled 2013-04-01: qty 1

## 2013-04-01 MED ORDER — GI COCKTAIL ~~LOC~~
30.0000 mL | Freq: Once | ORAL | Status: AC
  Administered 2013-04-01: 30 mL via ORAL
  Filled 2013-04-01: qty 30

## 2013-04-01 MED ORDER — FAMOTIDINE 20 MG PO TABS: 20.0000 mg | ORAL_TABLET | Freq: Two times a day (BID) | ORAL | Status: DC

## 2013-04-01 NOTE — MAU Provider Note (Signed)
  History     CSN: [id number]  Arrival date and time: 04/01/13 1933   First Provider Initiated Contact with Patient 04/01/13 2059      Chief Complaint  Patient presents with  . Contractions   HPI Claire Riley is a 27 y.o. [id number] at [redacted]w[redacted]d who presents with complaints of contractions and pelvic pain/pressure.  Patient receives her prenatal care in the [facility].  She reports that 2 hours ago she suddenly developed severe contractions and associated pelvic pain/pressure.  Pain sharp, regular and 8/10 in severity.  No vaginal bleeding, loss of fluid, vaginal discharge or dysuria.    ROS: Per HPI with the following additions.  No chest pain, SOB.  She does endorse heartburn.   Past Medical History  Diagnosis Date  . UTI (lower urinary tract infection)   . Bronchial asthma   . Gonorrhea   . Pneumonia   . Depression   . Headache(784.0)   . Trichomonas     Past Surgical History  Procedure Laterality Date  . Wisdom tooth extraction      2007    Family History  Problem Relation Age of Onset  . Hypertension Father   . Hypertension Mother   . Diabetes Father   . Diabetes Paternal Grandmother   . Scoliosis Mother   . Scoliosis Sister   . COPD Father     History  Substance Use Topics  . Smoking status: Current Every Day Smoker -- 0.25 packs/day  . Smokeless tobacco: Never Used  . Alcohol Use: No    Allergies:  Allergies  Allergen Reactions  . Tomato Hives    Facility-administered medications prior to admission  Medication Dose Route Frequency Provider Last Rate Last Dose  . TDaP (BOOSTRIX) injection 0.5 mL  0.5 mL Intramuscular Once Tereso Newcomer, MD       Prescriptions prior to admission  Medication Sig Dispense Refill  . acetaminophen (TYLENOL) 500 MG tablet Take 1,000 mg by mouth every 6 (six) hours as needed for pain.      . Prenatal Vit-Fe Fumarate-FA (PRENATAL MULTIVITAMIN) TABS Take 1 tablet by mouth daily at 12 noon.        ROS Per HPI  Physical Exam    Blood pressure 128/81, pulse 102, temperature 98.3 F (36.8 C), temperature source Oral, resp. rate 16, height 6\' 2"  (1.88 m), weight 109.827 kg (242 lb 2 oz), last menstrual period 12/07/2012, SpO2 95.00%.  Physical Exam Gen: appears in mild distress secondary to pain. Heart: RRR. 2/6 systolic murmur appreciated. Lungs: CTAB.  Abd: gravid but otherwise soft. Slightly diffusely tender to palpation.  No rebound or guarding. Ext: no appreciable lower extremity edema bilaterally Neuro: No focal deficits. GU: normal appearing external genitalia Dilation: Fingertip Effacement (%): Thick Cervical Position: Middle Presentation: Undeterminable Exam by:: Dr. Adriana Simas  FHR: baseline 150, mod variability, 10x10 accels, no decels Toco: no contractions noted   MAU Course  Procedures  Assessment and Plan  27 y.o. [id number] at [redacted]w[redacted]d who presents with complaints of contractions and pelvic pain/pressure. - No contractions noted on monitor.  Cervix 0.5 cm/Thick/Long - Will obtain fetal fibronectin given complaints.  Will treat pain with Percocet x 1. - Will await fibronectin.  2200 - Fetal fibronectin negative.  Pain now resolved.  No contractions noted on Toco.  Will discharge home.  [name] Other 04/01/2013, 8:59 PM   Agree with note FHR reassuring with no contractions on monitor , even after readjusting toco Wynelle Bourgeois CNM

## 2013-04-01 NOTE — MAU Note (Signed)
Pt reports uc's for the past two hours. Pt denies LOF or Vaginal bleeding. Pt also reports severe heartburn and decrease in appetite.

## 2013-04-01 NOTE — MAU Note (Signed)
Pt states that she has been having contractions for the last 2 hours

## 2013-04-16 ENCOUNTER — Inpatient Hospital Stay (HOSPITAL_COMMUNITY)
Admission: AD | Admit: 2013-04-16 | Discharge: 2013-04-16 | Disposition: A | Discharge status: Home / Self Care | Payer: Medicaid Other | Source: Ambulatory Visit | Attending: Obstetrics and Gynecology | Admitting: Obstetrics and Gynecology

## 2013-04-16 DIAGNOSIS — J029 Acute pharyngitis, unspecified: Secondary | ICD-10-CM

## 2013-04-16 DIAGNOSIS — O0930 Supervision of pregnancy with insufficient antenatal care, unspecified trimester: Secondary | ICD-10-CM

## 2013-04-16 DIAGNOSIS — J069 Acute upper respiratory infection, unspecified: Secondary | ICD-10-CM | POA: Insufficient documentation

## 2013-04-16 DIAGNOSIS — R51 Headache: Secondary | ICD-10-CM | POA: Insufficient documentation

## 2013-04-16 DIAGNOSIS — R059 Cough, unspecified: Secondary | ICD-10-CM | POA: Insufficient documentation

## 2013-04-16 DIAGNOSIS — R05 Cough: Secondary | ICD-10-CM

## 2013-04-16 LAB — RAPID STREP SCREEN (MED CTR MEBANE ONLY): Streptococcus, Group A Screen (Direct): NEGATIVE

## 2013-04-16 MED ORDER — GUAIFENESIN ER 600 MG PO TB12: 600.0000 mg | ORAL_TABLET | Freq: Two times a day (BID) | ORAL | Status: DC

## 2013-04-16 MED ORDER — ACETAMINOPHEN 325 MG PO TABS
650.0000 mg | ORAL_TABLET | Freq: Once | ORAL | Status: AC
  Administered 2013-04-16: 650 mg via ORAL
  Filled 2013-04-16: qty 2

## 2013-04-16 MED ORDER — PSEUDOEPHEDRINE HCL 30 MG PO TABS
60.0000 mg | ORAL_TABLET | Freq: Once | ORAL | Status: AC
  Administered 2013-04-16: 60 mg via ORAL
  Filled 2013-04-16: qty 2

## 2013-04-16 MED ORDER — GUAIFENESIN ER 600 MG PO TB12
600.0000 mg | ORAL_TABLET | Freq: Once | ORAL | Status: AC
  Administered 2013-04-16: 600 mg via ORAL
  Filled 2013-04-16: qty 1

## 2013-04-16 MED ORDER — PSEUDOEPHEDRINE HCL 30 MG PO TABS: 60.0000 mg | ORAL_TABLET | ORAL | Status: DC | PRN

## 2013-04-16 NOTE — MAU Note (Addendum)
PT SAYS SHE HAS HAD A  COUGH -  WITH CLEARISH  DULL COLOR -    THAT STARTED ON Tuesday.  SORE THROAT- AND STUFFY NOSE STARTED ON Tuesday.  DENIES FEVER..   NO VOMITING TODAY-  BUT DID VOMIT YESTERDAY.  HAD DIARRHEA - RUNNY  ON Monday.    MOM IN TRIAGE - - SAYS SHE IS ALSO   RUNNY NOSE AND SORE THROAT.  WAS IN CLINIC ON 6-2.  NEXT APPOINTMENT  IS 6-25.    SHE TOOK 2 TYLENOL AT 0900  THEN 2 TYLENOL AT 2PM.-   WITH NO RELIEF

## 2013-04-16 NOTE — MAU Provider Note (Signed)
History     CSN: [id number]  Arrival date and time: 04/16/13 4098   First Provider Initiated Contact with Patient 04/16/13 2046      No chief complaint on file.  HPI Patient complains of headache, cough, and sore throat since Monday, 6/16. Her mother, who is present in the room, has had similar symptoms during this timeframe. Cough is productive of thin, clear mucous. She also endorses nasal congestion without mucopurulent discharge. She also complains of mild GI symptoms for the last 2 days including nausea with one episode of vomiting yesterday, as well as several instances of loose, non-bloody stool. She has been able to tolerate PO intake, however has only wanted liquids for the last several days due to her throat tenderness.  OB History   Grav Para Term Preterm Abortions TAB SAB Ect Mult Living   4 1 1  2  2   1       Past Medical History  Diagnosis Date  . UTI (lower urinary tract infection)   . Bronchial asthma   . Gonorrhea   . Pneumonia   . Depression   . Headache(784.0)   . Trichomonas     Past Surgical History  Procedure Laterality Date  . Wisdom tooth extraction      2007    Family History  Problem Relation Age of Onset  . Hypertension Father   . Hypertension Mother   . Diabetes Father   . Diabetes Paternal Grandmother   . Scoliosis Mother   . Scoliosis Sister   . COPD Father     History  Substance Use Topics  . Smoking status: Current Every Day Smoker -- 0.25 packs/day  . Smokeless tobacco: Never Used  . Alcohol Use: No    Allergies:  Allergies  Allergen Reactions  . Tomato Hives    Prescriptions prior to admission  Medication Sig Dispense Refill  . acetaminophen (TYLENOL) 500 MG tablet Take 1,000 mg by mouth every 6 (six) hours as needed for pain.      . calcium carbonate (TUMS - DOSED IN MG ELEMENTAL CALCIUM) 500 MG chewable tablet Chew 2 tablets by mouth 3 (three) times daily as needed for heartburn.      . Prenatal Vit-Fe Fumarate-FA  (PRENATAL MULTIVITAMIN) TABS Take 1 tablet by mouth daily at 12 noon.        Review of Systems  Constitutional: Positive for malaise/fatigue. Negative for fever, chills and weight loss.  HENT: Negative for hearing loss, ear pain, nosebleeds, tinnitus and ear discharge.   Eyes: Negative for blurred vision and double vision.  Respiratory: Positive for cough and sputum production (thin clear). Negative for hemoptysis, shortness of breath and stridor.   Cardiovascular: Negative for chest pain and palpitations.  Gastrointestinal: Positive for nausea, vomiting and diarrhea. Negative for heartburn, abdominal pain, constipation and blood in stool.  Genitourinary: Negative for dysuria, urgency, frequency and hematuria.  Musculoskeletal: Negative for myalgias.  Skin: Negative for itching and rash.  Neurological: Positive for headaches. Negative for dizziness.   Physical Exam   Blood pressure 120/69, pulse 108, temperature 98.3 F (36.8 C), temperature source Oral, resp. rate 20, height 5\' 10"  (1.778 m), weight 113.569 kg (250 lb 6 oz), last menstrual period 12/07/2012.  Physical Exam  Constitutional: She is oriented to person, place, and time. She appears well-developed and well-nourished.  HENT:  Head: Normocephalic and atraumatic. No trismus in the jaw.  Right Ear: Tympanic membrane, external ear and ear canal normal.  Left Ear: Tympanic membrane,  external ear and ear canal normal.  Nose: Mucosal edema and rhinorrhea present. No nose lacerations, sinus tenderness or nasal deformity. Right sinus exhibits maxillary sinus tenderness. Left sinus exhibits maxillary sinus tenderness.  Mouth/Throat: Uvula is midline and mucous membranes are normal. Posterior oropharyngeal erythema (Palatal patechiae) present. No oropharyngeal exudate, posterior oropharyngeal edema or tonsillar abscesses.  Cardiovascular: Normal rate, regular rhythm and intact distal pulses.  Exam reveals no gallop and no friction rub.    Murmur (II/VI cresc/dec systolic murmur best appreciated at LLSB) heard. Respiratory: Effort normal and breath sounds normal. She has no wheezes. She has no rales.  GI:  Gravid with firm uterus, otherwise soft, non-tender. BS+, no palpable masses  Musculoskeletal: Normal range of motion.  Neurological: She is alert and oriented to person, place, and time.  Skin: Skin is warm and dry.  Psychiatric: She has a normal mood and affect. Her behavior is normal.    MAU Course  Procedures None MDM Rapid Strep test negative  Assessment and Plan  A: Upper respiratory infection P: 1. Patient felt symptomatic relief with Sudafed and Mucinex in MAU 2. Discharge home with prescription for Sudafed and Mucinex 3. Return to healthcare provider should symptoms persist or worsen  Arther Abbott 04/16/2013, 9:38 PM   Seen also by me Agree with note Wynelle Bourgeois CNM

## 2013-04-18 LAB — CULTURE, GROUP A STREP

## 2013-04-20 NOTE — MAU Provider Note (Signed)
Attestation of Attending Supervision of Advanced Practitioner (CNM/NP): Evaluation and management procedures were performed by the Advanced Practitioner under my supervision and collaboration.  I have reviewed the Advanced Practitioner's note and chart, and I agree with the management and plan.  Ayomide Purdy 04/20/2013 12:00 PM   

## 2013-04-22 ENCOUNTER — Ambulatory Visit (INDEPENDENT_AMBULATORY_CARE_PROVIDER_SITE_OTHER): Payer: Medicaid Other | Admitting: Family Medicine

## 2013-04-22 VITALS — BP 119/74 | Temp 97.9°F | Wt 249.6 lb

## 2013-04-22 DIAGNOSIS — O0933 Supervision of pregnancy with insufficient antenatal care, third trimester: Secondary | ICD-10-CM

## 2013-04-22 DIAGNOSIS — O0930 Supervision of pregnancy with insufficient antenatal care, unspecified trimester: Secondary | ICD-10-CM

## 2013-04-22 LAB — POCT URINALYSIS DIP (DEVICE)
Hgb urine dipstick: NEGATIVE
Nitrite: NEGATIVE
Protein, ur: NEGATIVE mg/dL
Specific Gravity, Urine: 1.015 (ref 1.005–1.030)
Urobilinogen, UA: 1 mg/dL (ref 0.0–1.0)

## 2013-04-22 NOTE — Progress Notes (Signed)
Pulse- 97 Patient reports lower abdominal/pelvic pain & pressure

## 2013-04-22 NOTE — Progress Notes (Signed)
No complaints

## 2013-04-22 NOTE — Patient Instructions (Signed)

## 2013-05-06 ENCOUNTER — Encounter: Payer: Self-pay | Admitting: Advanced Practice Midwife

## 2013-05-06 ENCOUNTER — Ambulatory Visit (INDEPENDENT_AMBULATORY_CARE_PROVIDER_SITE_OTHER): Payer: Medicaid Other | Admitting: Advanced Practice Midwife

## 2013-05-06 VITALS — BP 120/82 | Temp 98.0°F | Wt 243.0 lb

## 2013-05-06 DIAGNOSIS — O0930 Supervision of pregnancy with insufficient antenatal care, unspecified trimester: Secondary | ICD-10-CM

## 2013-05-06 DIAGNOSIS — O0933 Supervision of pregnancy with insufficient antenatal care, third trimester: Secondary | ICD-10-CM

## 2013-05-06 LAB — POCT URINALYSIS DIP (DEVICE)
Nitrite: NEGATIVE
Protein, ur: NEGATIVE mg/dL
Urobilinogen, UA: 0.2 mg/dL (ref 0.0–1.0)
pH: 7 (ref 5.0–8.0)

## 2013-05-06 NOTE — Patient Instructions (Signed)

## 2013-05-06 NOTE — Progress Notes (Signed)
Reports occasional spells of feeling hot and dizzy, improves with fluids and laying down. Otherwise, no significant complaints or concerns. Infrequent contractions 2x per hour, irregular. Advised on general expectations of pregnancy (3rd trimester), plans for monitoring contractions and how to identify labor. Performed GBS and GC/Chlam screening today. Follow-up 1 week.

## 2013-05-06 NOTE — Progress Notes (Signed)
I have seen this patient and agree with the resident's note.  LEFTWICH-KIRBY, Deseree Zemaitis Certified Nurse-Midwife 

## 2013-05-06 NOTE — Progress Notes (Signed)
Pulse: 117 Patient has a very intense pressure in her vaginal area.

## 2013-05-09 LAB — OB RESULTS CONSOLE GBS: GBS: NEGATIVE

## 2013-05-12 ENCOUNTER — Encounter (HOSPITAL_COMMUNITY): Payer: Self-pay | Admitting: *Deleted

## 2013-05-12 ENCOUNTER — Inpatient Hospital Stay (HOSPITAL_COMMUNITY)
Admission: AD | Admit: 2013-05-12 | Discharge: 2013-05-12 | Disposition: A | Discharge status: Home / Self Care | Payer: Medicaid Other | Source: Ambulatory Visit | Attending: Family Medicine | Admitting: Family Medicine

## 2013-05-12 DIAGNOSIS — O9989 Other specified diseases and conditions complicating pregnancy, childbirth and the puerperium: Secondary | ICD-10-CM

## 2013-05-12 DIAGNOSIS — O99891 Other specified diseases and conditions complicating pregnancy: Secondary | ICD-10-CM | POA: Insufficient documentation

## 2013-05-12 DIAGNOSIS — N898 Other specified noninflammatory disorders of vagina: Secondary | ICD-10-CM

## 2013-05-12 DIAGNOSIS — R109 Unspecified abdominal pain: Secondary | ICD-10-CM | POA: Insufficient documentation

## 2013-05-12 LAB — URINALYSIS, ROUTINE W REFLEX MICROSCOPIC
Glucose, UA: NEGATIVE mg/dL
Hgb urine dipstick: NEGATIVE
Specific Gravity, Urine: <1.005 — ABNORMAL LOW (ref 1.005–1.030)
Urobilinogen, UA: 0.2 mg/dL (ref 0.0–1.0)
pH: 6 (ref 5.0–8.0)

## 2013-05-12 NOTE — MAU Note (Signed)
Pt states for past 2 days has had cramping, this am awoke to wet bed, did not smell like urine.

## 2013-05-12 NOTE — MAU Provider Note (Signed)
Chart reviewed and agree with management and plan.  

## 2013-05-12 NOTE — Progress Notes (Signed)
Md informed of Possible ROM and Fern Slide. No new orders received. Will come to evaluate patient.

## 2013-05-12 NOTE — MAU Provider Note (Signed)
History     CSN: [id number]  Arrival date and time: 05/12/13 1191   First Provider Initiated Contact with Patient 05/12/13 [phone]      Chief Complaint  Patient presents with  . Possible ROM    HPI This is a 27 y.o. female at [redacted]w[redacted]d who presents with c/o leaking episode this morning at [facility]. Had no further leaking afterward. Has a few contractions. Good FM.   RN Note: Pt states for past 2 days has had cramping, this am awoke to wet bed, did not smell like urine  OB History   Grav Para Term Preterm Abortions TAB SAB Ect Mult Living   4 1 1  2  2   1       Past Medical History  Diagnosis Date  . UTI (lower urinary tract infection)   . Bronchial asthma   . Gonorrhea   . Pneumonia   . Depression   . Headache(784.0)   . Trichomonas     Past Surgical History  Procedure Laterality Date  . Wisdom tooth extraction      2007    Family History  Problem Relation Age of Onset  . Hypertension Father   . Hypertension Mother   . Diabetes Father   . Diabetes Paternal Grandmother   . Scoliosis Mother   . Scoliosis Sister   . COPD Father     History  Substance Use Topics  . Smoking status: Former Smoker -- 0.25 packs/day  . Smokeless tobacco: Never Used  . Alcohol Use: No    Allergies:  Allergies  Allergen Reactions  . Tomato Hives    Prescriptions prior to admission  Medication Sig Dispense Refill  . calcium carbonate (TUMS - DOSED IN MG ELEMENTAL CALCIUM) 500 MG chewable tablet Chew 2 tablets by mouth 3 (three) times daily as needed for heartburn.      . Prenatal Vit-Fe Fumarate-FA (PRENATAL MULTIVITAMIN) TABS Take 1 tablet by mouth daily at 12 noon.        Review of Systems  Constitutional: Negative for fever and chills.  Gastrointestinal: Positive for abdominal pain (with contractions). Negative for nausea, vomiting, diarrhea and constipation.  Neurological: Negative for dizziness and headaches.   Physical Exam   Blood pressure 122/79, pulse 109, temperature  97.6 F (36.4 C), temperature source Oral, resp. rate 20, height 6\' 2"  (1.88 m), weight 114.42 kg (252 lb 4 oz), last menstrual period 12/07/2012, SpO2 100.00%.  Physical Exam  Constitutional: She appears well-developed and well-nourished. No distress.  Cardiovascular: Normal rate.   Respiratory: Effort normal.  GI: Soft. She exhibits no distension. There is no tenderness. There is no rebound and no guarding.  Genitourinary: Uterus normal. Vaginal discharge (thick white, no pooling, no ferning) found.   Dilation: 1 Effacement (%): 50 Station: -3 Exam by:: Artelia Laroche CNM  MAU Course  Procedures  Assessment and Plan  A:  SIUP at [redacted]w[redacted]d      No evidence of ROM      Not in labor  P: Discharge home      Labor precautions   Medication List         calcium carbonate 500 MG chewable tablet  Commonly known as:  TUMS - dosed in mg elemental calcium  Chew 2 tablets by mouth 3 (three) times daily as needed for heartburn.     prenatal multivitamin Tabs  Take 1 tablet by mouth daily at 12 noon.         [facility] 05/12/2013, 9:54 AM

## 2013-05-13 ENCOUNTER — Encounter: Payer: Medicaid Other | Admitting: Advanced Practice Midwife

## 2013-05-20 ENCOUNTER — Inpatient Hospital Stay (HOSPITAL_COMMUNITY)
Admission: AD | Admit: 2013-05-20 | Discharge: 2013-05-20 | Disposition: A | Discharge status: Home / Self Care | Payer: Medicaid Other | Source: Ambulatory Visit | Attending: Obstetrics & Gynecology | Admitting: Obstetrics & Gynecology

## 2013-05-20 ENCOUNTER — Encounter: Payer: Self-pay | Admitting: Obstetrics and Gynecology

## 2013-05-20 ENCOUNTER — Ambulatory Visit (INDEPENDENT_AMBULATORY_CARE_PROVIDER_SITE_OTHER): Payer: Medicaid Other | Admitting: Family Medicine

## 2013-05-20 ENCOUNTER — Encounter (HOSPITAL_COMMUNITY): Payer: Self-pay | Admitting: *Deleted

## 2013-05-20 VITALS — BP 120/83 | Temp 97.6°F | Wt 262.3 lb

## 2013-05-20 DIAGNOSIS — Z3483 Encounter for supervision of other normal pregnancy, third trimester: Secondary | ICD-10-CM

## 2013-05-20 DIAGNOSIS — O0930 Supervision of pregnancy with insufficient antenatal care, unspecified trimester: Secondary | ICD-10-CM

## 2013-05-20 DIAGNOSIS — O479 False labor, unspecified: Secondary | ICD-10-CM

## 2013-05-20 LAB — POCT URINALYSIS DIP (DEVICE)
Bilirubin Urine: NEGATIVE
Glucose, UA: NEGATIVE mg/dL
Hgb urine dipstick: NEGATIVE
Nitrite: NEGATIVE
Specific Gravity, Urine: 1.015 (ref 1.005–1.030)

## 2013-05-20 NOTE — Progress Notes (Signed)
Pulse- 106 Patient reports lower abdominal & vaginal pain/pressure and some contractions

## 2013-05-20 NOTE — MAU Note (Signed)
Pt presents with contractions since Dr visit today increasing in intensity at 1900 to every 5-8 minutes.  +  Fm per pt. Pt denies pih symptoms. Srom, or bleeding

## 2013-05-20 NOTE — Progress Notes (Signed)
Claire Riley is a 27 y.o. [phone] at [redacted]w[redacted]d here for ROB visit. Negative (07/12 0000)  +FM , no lof, no vb, int ctx  Discussed with Patient:  - Plans to breas feed.  All questions answered. - Continue prenatal vitamins. - Reviewed fetal kick counts Pt to perform daily at a time when the baby is active, lie laterally with both hands on belly in quiet room and count all movements (hiccups, shoulder rolls, obvious kicks, etc); pt is to report to clinic MAU for less than 10 movements felt in a 2 hour time period-pt told as soon as she counts 10 movements the count is complete.  - Routine precautions discussed (depression, infection s/s).   Patient provided with all pertinent phone numbers for emergencies. - RTC for any VB, regular, painful cramps/ctxs occurring at a rate of >2/10 min, fever (100.5 or higher), n/v/d, any pain that is unresolving or worsening, LOF, decreased fetal movement, CP, SOB, edema -RTC in one week for next visit.  Problems: Patient Active Problem List   Diagnosis Date Noted  . Late prenatal care 02/09/2013    To Do: 1. NTD  [x ] Vaccines: Flu:  Tdap: 4/14 [x ] BCM: nexplanon [x ] Readiness: baby has a place to sleep, car seat, other baby necessities. No pain control None Circumcison Edu: [ x] TL precautions;  [ x] childbirth class;

## 2013-05-21 NOTE — MAU Provider Note (Signed)
  History     CSN: [id number]  Arrival date and time: 05/20/13 2030   None     Chief Complaint  Patient presents with  . Labor Eval   HPI Claire Riley is a 27yo [id number] at 38.3wks who presents for eval of labor due to contractions. Denies leak or bldg. Reports +FM. No N/V/D or RUQ pain. She receives [facility].  OB History   Grav Para Term Preterm Abortions TAB SAB Ect Mult Living   4 1 1  2  2   1       Past Medical History  Diagnosis Date  . UTI (lower urinary tract infection)   . Bronchial asthma   . Gonorrhea   . Pneumonia   . Depression   . Headache(784.0)   . Trichomonas     Past Surgical History  Procedure Laterality Date  . Wisdom tooth extraction      2007    Family History  Problem Relation Age of Onset  . Hypertension Father   . Hypertension Mother   . Diabetes Father   . Diabetes Paternal Grandmother   . Scoliosis Mother   . Scoliosis Sister   . COPD Father     History  Substance Use Topics  . Smoking status: Former Smoker -- 0.25 packs/day  . Smokeless tobacco: Never Used  . Alcohol Use: No    Allergies:  Allergies  Allergen Reactions  . Tomato Hives    No prescriptions prior to admission    ROS Physical Exam   Blood pressure 135/89, pulse 102, temperature 98.2 F (36.8 C), temperature source Oral, resp. rate 18, height 6\' 2"  (1.88 m), weight 118.978 kg (262 lb 4.8 oz), last menstrual period 12/07/2012.  Physical Exam  Constitutional: She is oriented to person, place, and time. She appears well-developed.  HENT:  Head: Normocephalic.  Neck: Normal range of motion.  Cardiovascular: Normal rate.   Respiratory: Effort normal.  GI:  FHT 150s +accels, no decels Irreg ctx/irritability lasting 30-40secs each  Genitourinary:  RN exam 1/50/-3  Musculoskeletal: Normal range of motion.  Neurological: She is alert and oriented to person, place, and time.  Skin: Skin is warm and dry.  Psychiatric: She has a normal mood  and affect. Her behavior is normal. Thought content normal.   Urinalysis    Component Value Date/Time   COLORURINE YELLOW 05/12/2013 0935   APPEARANCEUR CLEAR 05/12/2013 0935   LABSPEC 1.015 05/20/2013 0954   PHURINE 7.0 05/20/2013 0954   GLUCOSEU NEGATIVE 05/20/2013 0954   HGBUR NEGATIVE 05/20/2013 0954   BILIRUBINUR NEGATIVE 05/20/2013 0954   KETONESUR NEGATIVE 05/20/2013 0954   PROTEINUR NEGATIVE 05/20/2013 0954   UROBILINOGEN 1.0 05/20/2013 0954   NITRITE NEGATIVE 05/20/2013 0954   LEUKOCYTESUR SMALL* 05/20/2013 0954     MAU Course  Procedures  MDM Cx reeval after 1 hr- no change.   Assessment and Plan  IUP at 38.4wks False labor  D/C home with labor/bldg/ROM precautions Keep next scheduled OB visit  Claire Riley 05/21/2013, 12:30 AM

## 2013-05-27 ENCOUNTER — Encounter: Payer: Self-pay | Admitting: Family Medicine

## 2013-05-27 ENCOUNTER — Ambulatory Visit (INDEPENDENT_AMBULATORY_CARE_PROVIDER_SITE_OTHER): Payer: Medicaid Other | Admitting: Family Medicine

## 2013-05-27 VITALS — BP 132/79 | Temp 97.7°F | Wt 264.0 lb

## 2013-05-27 DIAGNOSIS — O0930 Supervision of pregnancy with insufficient antenatal care, unspecified trimester: Secondary | ICD-10-CM

## 2013-05-27 DIAGNOSIS — O0933 Supervision of pregnancy with insufficient antenatal care, third trimester: Secondary | ICD-10-CM

## 2013-05-27 LAB — POCT URINALYSIS DIP (DEVICE)
Bilirubin Urine: NEGATIVE
Glucose, UA: NEGATIVE mg/dL
Ketones, ur: NEGATIVE mg/dL
pH: 7 (ref 5.0–8.0)

## 2013-05-27 NOTE — Progress Notes (Signed)
Pulse 101 Edema trace in hands and feet. C/o pelvic pain when ambulating.

## 2013-05-27 NOTE — Patient Instructions (Signed)

## 2013-05-27 NOTE — Progress Notes (Signed)
Pt is a 27 yo G4P1021 At [redacted]w[redacted]d by 21wk [place].    +FM, no lof, no vb, int ctx.   O: see flowsheet  A/P - doing well.  - labor precautions discussed.  - will need to start twice weekly NSTs for post dates next week.

## 2013-06-01 ENCOUNTER — Inpatient Hospital Stay (HOSPITAL_COMMUNITY): Payer: Medicaid Other

## 2013-06-01 ENCOUNTER — Encounter (HOSPITAL_COMMUNITY): Payer: Self-pay

## 2013-06-01 ENCOUNTER — Inpatient Hospital Stay (HOSPITAL_COMMUNITY)
Admission: AD | Admit: 2013-06-01 | Discharge: 2013-06-04 | DRG: 775 | Disposition: A | Discharge status: Home / Self Care | Payer: Medicaid Other | Source: Ambulatory Visit | Attending: Obstetrics & Gynecology | Admitting: Obstetrics & Gynecology

## 2013-06-01 DIAGNOSIS — O480 Post-term pregnancy: Secondary | ICD-10-CM | POA: Diagnosis present

## 2013-06-01 DIAGNOSIS — O0930 Supervision of pregnancy with insufficient antenatal care, unspecified trimester: Secondary | ICD-10-CM

## 2013-06-01 HISTORY — DX: Anemia, unspecified: D64.9

## 2013-06-01 NOTE — Progress Notes (Signed)
MFM ultrasound   Indication: 27 yr old [id number] at [redacted]w[redacted]d for biophysical profile secondary to fetal decelerations.  Findings: 1. Single intrauterine pregnancy. 2. Anterior placenta without evidence of previa. 3. Normal amniotic fluid index; although increased for gestational age. 4. Normal biophysical profile of 8/8.  Recommendations: 1. Normal biophysical profile. 2. Given [redacted]w[redacted]d with concern for fetal decelerations recommend admit patient for delivery. Recommend continuous fetal monitoring.  Discussed with Dr. Ike Bene who is in agreement with the above plan.  Eulis Foster, MD

## 2013-06-01 NOTE — H&P (Signed)
Claire Riley is a 27 y.o. female presenting for Severe contractions at [redacted]w[redacted]d pt had small late decelerations. Patient BPP and was 8 out of 8. MFM red BPP and recommended for admission for induction. Patient otherwise doing well  Positive fetal movement, no loss of fluid no vaginal bleeding  . Maternal Medical History:  Reason for admission: Nausea.    OB History   Grav Para Term Preterm Abortions TAB SAB Ect Mult Living   4 1 1  2  2   1      Past Medical History  Diagnosis Date  . UTI (lower urinary tract infection)   . Bronchial asthma   . Gonorrhea   . Pneumonia   . Depression   . Headache(784.0)   . Trichomonas   . Anemia    Past Surgical History  Procedure Laterality Date  . Wisdom tooth extraction      2007   Family History: family history includes COPD in her father; Diabetes in her father and paternal grandmother; Hypertension in her father and mother; and Scoliosis in her mother and sister. Social History:  reports that she has quit smoking. She has never used smokeless tobacco. She reports that she uses illicit drugs (Marijuana). She reports that she does not drink alcohol.   Prenatal Transfer Tool  Maternal Diabetes: No Genetic Screening: Declined Maternal Ultrasounds/Referrals: Normal Fetal Ultrasounds or other Referrals:  None Maternal Substance Abuse:  No Significant Maternal Medications:  None Significant Maternal Lab Results:  None Other Comments:  None  Review of Systems  Constitutional: Negative for fever and chills.  Eyes: Negative for blurred vision and double vision.  Cardiovascular: Negative for chest pain, palpitations and orthopnea.  Gastrointestinal: Positive for abdominal pain (ctx). Negative for heartburn, nausea, vomiting and diarrhea.  Genitourinary: Negative for dysuria, urgency and frequency.  Musculoskeletal: Negative for myalgias.  Neurological: Negative for headaches.    Dilation: 3 Effacement (%): 30 Station: -3 Exam by::  Dr. Ike Bene Blood pressure 122/76, pulse 111, temperature 99 F (37.2 C), temperature source Oral, resp. rate 20, height 6\' 2"  (1.88 m), weight 119.75 kg (264 lb), last menstrual period 12/07/2012, SpO2 99.00%. Exam Physical Exam  Constitutional: She is oriented to person, place, and time. She appears well-developed and well-nourished.  HENT:  Head: Normocephalic.  Cardiovascular: Normal rate, regular rhythm, normal heart sounds and intact distal pulses.  Exam reveals no gallop and no friction rub.   No murmur heard. Respiratory: Effort normal and breath sounds normal. No respiratory distress. She has no wheezes.  GI: Soft. Bowel sounds are normal. She exhibits no distension. There is no tenderness.  Neurological: She is alert and oriented to person, place, and time.  Skin: She is not diaphoretic.    Prenatal labs: ABO, Rh: --/--/B POS (03/28 1424) Antibody:   Rubella: 21.60 (03/28 1353) RPR: NON REAC (05/12 1216)  HBsAg: NEGATIVE (03/28 1353)  HIV: NON REACTIVE (03/28 1353)  GBS: Negative (07/12 0000)   Assessment/Plan: [name] is a 27 y.o. [id number] at [redacted]w[redacted]d admitted for IOL 2/2 subtle lates in post dates patient.  LaborAdmitPlan #Labor: recheck now, if no change will start pitocin for augmentation #Pain: Desires IV pain meds, considers epiduraly #FWB: Cat II 2/2 late decels. Otherwise reassuring #ID: GBS neg #MOF: Breast #MOC: nexplanon    Michaela Shankel, RYAN 06/01/2013, 11:57 PM

## 2013-06-01 NOTE — MAU Note (Signed)
Contractions every 3-5 minutes x 3 hours. Denies leaking of fluid or vaginal bleeding. Decreased fetal movement x 2 hours. Was dilated 1.5 cm in office last week.

## 2013-06-01 NOTE — MAU Provider Note (Signed)
  History     CSN: [id number]  Arrival date and time: 06/01/13 1953   None     Chief Complaint  Patient presents with  . Labor Eval   HPI  Ms Glotfelty is a 27yo [phone] at 40.1 wks who presents for eval of labor due to contractions. Denies leakage of fluids or blood. Reports +FM. In the last few hours, contractions have become stronger and more regular every 3-5 minutes.  Contractions started last night. Pt otherwise is feeling well.   Past Medical History  Diagnosis Date  . UTI (lower urinary tract infection)   . Bronchial asthma   . Gonorrhea   . Pneumonia   . Depression   . Headache(784.0)   . Trichomonas   . Anemia     Past Surgical History  Procedure Laterality Date  . Wisdom tooth extraction      2007    Family History  Problem Relation Age of Onset  . Hypertension Father   . Hypertension Mother   . Diabetes Father   . Diabetes Paternal Grandmother   . Scoliosis Mother   . Scoliosis Sister   . COPD Father     History  Substance Use Topics  . Smoking status: Former Smoker -- 0.25 packs/day  . Smokeless tobacco: Never Used  . Alcohol Use: No    Allergies:  Allergies  Allergen Reactions  . Tomato Hives    Prescriptions prior to admission  Medication Sig Dispense Refill  . calcium carbonate (TUMS - DOSED IN MG ELEMENTAL CALCIUM) 500 MG chewable tablet Chew 2 tablets by mouth 3 (three) times daily as needed for heartburn.      . Prenatal Vit-Min-FA-Fish Oil ([employer] PRENATAL GUMMY PO) Take 2 tablets by mouth daily.        ROS Physical Exam   Blood pressure 122/76, pulse 118, temperature 99 F (37.2 C), temperature source Oral, resp. rate 20, height 6\' 2"  (1.88 m), weight 264 lb (119.75 kg), last menstrual period 12/07/2012.  Physical Exam Cervical exam 3 cm, 60% effacement, posterior cervix, undetermined station due to bag of membranes at cervical os Baseline FHR 150, moderate variability, multiple accels, no decels Ctx Q6 min  MAU Course   Procedures FH strip monitoring reassuring. Cervical check consistent with latent labor  MDM   Assessment and Plan  Latent stage of labor. Pt with reassuring strip.  Discharge and advise patient to walk at home and return when ctx Q3 minutes or ROM. Pt may return at 4hrs for recheck or if ctx space out may return at patient discretion. Garnette Czech 06/01/2013, 8:46 PM   I spoke with and examined patient and agree with PA-S's note and plan of care. Pt in latent labor and I discussed with patient risks of early admission including early intervention. Pt and husband agreeable with going home and returning in 4 hrs or later for reevaluation.   Tawana Scale, MD Ob Fellow 06/01/2013 8:59 PM

## 2013-06-02 ENCOUNTER — Encounter (HOSPITAL_COMMUNITY): Payer: Self-pay | Admitting: *Deleted

## 2013-06-02 DIAGNOSIS — O480 Post-term pregnancy: Secondary | ICD-10-CM

## 2013-06-02 LAB — CBC
MCHC: 31.1 g/dL (ref 30.0–36.0)
RDW: 15.4 % (ref 11.5–15.5)

## 2013-06-02 LAB — RPR: RPR Ser Ql: NONREACTIVE

## 2013-06-02 MED ORDER — TERBUTALINE SULFATE 1 MG/ML IJ SOLN: 0.2500 mg | Freq: Once | INTRAMUSCULAR | Status: DC | PRN

## 2013-06-02 MED ORDER — IBUPROFEN 600 MG PO TABS
600.0000 mg | ORAL_TABLET | Freq: Four times a day (QID) | ORAL | Status: DC | PRN
  Administered 2013-06-02: 600 mg via ORAL
  Filled 2013-06-02: qty 1

## 2013-06-02 MED ORDER — BENZOCAINE-MENTHOL 20-0.5 % EX AERO
1.0000 "application " | INHALATION_SPRAY | CUTANEOUS | Status: DC | PRN
  Administered 2013-06-02: 1 via TOPICAL
  Filled 2013-06-02: qty 56

## 2013-06-02 MED ORDER — ZOLPIDEM TARTRATE 5 MG PO TABS: 5.0000 mg | ORAL_TABLET | Freq: Every evening | ORAL | Status: DC | PRN

## 2013-06-02 MED ORDER — ONDANSETRON HCL 4 MG/2ML IJ SOLN: 4.0000 mg | INTRAMUSCULAR | Status: DC | PRN

## 2013-06-02 MED ORDER — ACETAMINOPHEN 325 MG PO TABS: 650.0000 mg | ORAL_TABLET | ORAL | Status: DC | PRN

## 2013-06-02 MED ORDER — OXYTOCIN 40 UNITS IN LACTATED RINGERS INFUSION - SIMPLE MED
1.0000 m[IU]/min | INTRAVENOUS | Status: DC
  Administered 2013-06-02: 2 m[IU]/min via INTRAVENOUS
  Filled 2013-06-02: qty 1000

## 2013-06-02 MED ORDER — WITCH HAZEL-GLYCERIN EX PADS: 1.0000 "application " | MEDICATED_PAD | CUTANEOUS | Status: DC | PRN

## 2013-06-02 MED ORDER — OXYTOCIN 40 UNITS IN LACTATED RINGERS INFUSION - SIMPLE MED: 62.5000 mL/h | INTRAVENOUS | Status: DC

## 2013-06-02 MED ORDER — ONDANSETRON HCL 4 MG PO TABS: 4.0000 mg | ORAL_TABLET | ORAL | Status: DC | PRN

## 2013-06-02 MED ORDER — SENNOSIDES-DOCUSATE SODIUM 8.6-50 MG PO TABS
2.0000 | ORAL_TABLET | Freq: Every day | ORAL | Status: DC
  Administered 2013-06-02 – 2013-06-03 (×2): 2 via ORAL

## 2013-06-02 MED ORDER — LACTATED RINGERS IV SOLN
INTRAVENOUS | Status: DC
  Administered 2013-06-02: via INTRAVENOUS

## 2013-06-02 MED ORDER — PRENATAL MULTIVITAMIN CH
1.0000 | ORAL_TABLET | Freq: Every day | ORAL | Status: DC
  Administered 2013-06-02 – 2013-06-03 (×2): 1 via ORAL
  Filled 2013-06-02 (×2): qty 1

## 2013-06-02 MED ORDER — OXYCODONE-ACETAMINOPHEN 5-325 MG PO TABS
1.0000 | ORAL_TABLET | ORAL | Status: DC | PRN
  Administered 2013-06-02 (×2): 1 via ORAL
  Filled 2013-06-02 (×2): qty 1

## 2013-06-02 MED ORDER — OXYCODONE-ACETAMINOPHEN 5-325 MG PO TABS
1.0000 | ORAL_TABLET | ORAL | Status: DC | PRN
  Administered 2013-06-02 – 2013-06-03 (×3): 2 via ORAL
  Filled 2013-06-02 (×3): qty 2

## 2013-06-02 MED ORDER — DIPHENHYDRAMINE HCL 25 MG PO CAPS: 25.0000 mg | ORAL_CAPSULE | Freq: Four times a day (QID) | ORAL | Status: DC | PRN

## 2013-06-02 MED ORDER — FENTANYL CITRATE 0.05 MG/ML IJ SOLN
50.0000 ug | INTRAMUSCULAR | Status: DC | PRN
  Administered 2013-06-02: 50 ug via INTRAVENOUS
  Filled 2013-06-02 (×3): qty 2

## 2013-06-02 MED ORDER — DIBUCAINE 1 % RE OINT: 1.0000 "application " | TOPICAL_OINTMENT | RECTAL | Status: DC | PRN

## 2013-06-02 MED ORDER — CITRIC ACID-SODIUM CITRATE 334-500 MG/5ML PO SOLN
30.0000 mL | ORAL | Status: DC | PRN
  Administered 2013-06-02: 30 mL via ORAL
  Filled 2013-06-02: qty 15

## 2013-06-02 MED ORDER — LIDOCAINE HCL (PF) 1 % IJ SOLN
30.0000 mL | INTRAMUSCULAR | Status: DC | PRN
  Filled 2013-06-02 (×2): qty 30

## 2013-06-02 MED ORDER — TETANUS-DIPHTH-ACELL PERTUSSIS 5-2.5-18.5 LF-MCG/0.5 IM SUSP: 0.5000 mL | Freq: Once | INTRAMUSCULAR | Status: DC

## 2013-06-02 MED ORDER — OXYTOCIN BOLUS FROM INFUSION: 500.0000 mL | INTRAVENOUS | Status: DC

## 2013-06-02 MED ORDER — LACTATED RINGERS IV SOLN: 500.0000 mL | INTRAVENOUS | Status: DC | PRN

## 2013-06-02 MED ORDER — ONDANSETRON HCL 4 MG/2ML IJ SOLN: 4.0000 mg | Freq: Four times a day (QID) | INTRAMUSCULAR | Status: DC | PRN

## 2013-06-02 MED ORDER — PNEUMOCOCCAL VAC POLYVALENT 25 MCG/0.5ML IJ INJ
0.5000 mL | INJECTION | INTRAMUSCULAR | Status: AC
  Administered 2013-06-02: 0.5 mL via INTRAMUSCULAR
  Filled 2013-06-02: qty 0.5

## 2013-06-02 MED ORDER — LANOLIN HYDROUS EX OINT: TOPICAL_OINTMENT | CUTANEOUS | Status: DC | PRN

## 2013-06-02 MED ORDER — SIMETHICONE 80 MG PO CHEW: 80.0000 mg | CHEWABLE_TABLET | ORAL | Status: DC | PRN

## 2013-06-02 MED ORDER — IBUPROFEN 600 MG PO TABS
600.0000 mg | ORAL_TABLET | Freq: Four times a day (QID) | ORAL | Status: DC
  Administered 2013-06-02 – 2013-06-04 (×7): 600 mg via ORAL
  Filled 2013-06-02 (×7): qty 1

## 2013-06-02 NOTE — Progress Notes (Signed)
Claire Riley is a 27 y.o. [phone] at [redacted]w[redacted]d admitted for induction of labor due to subtle decels on NST. Discussed with MFM and recommended IOL after reading 8/8 BPP.  Subjective: Pt with continued painful ctx but no significant change on cervical Exam  Objective: BP 117/75  Pulse 92  Temp(Src) 99 F (37.2 C) (Oral)  Resp 18  Ht 6\' 2"  (1.88 m)  Wt 119.75 kg (264 lb)  BMI 33.88 kg/m2  SpO2 99%  LMP 12/07/2012      FHT:  FHR: 150s bpm, variability: moderate,  accelerations:  Present,  decelerations:  Absent  No recurrent late decels since triage UC:   regular, every 3-5 minutes SVE:   Dilation: 3 Effacement (%): 40 Station: -3 Exam by:: dr. Ike Bene  Labs: Lab Results  Component Value Date   WBC 9.5 06/02/2013   HGB 9.5* 06/02/2013   HCT 30.5* 06/02/2013   MCV 72.6* 06/02/2013   PLT 203 06/02/2013    Assessment / Plan: Induction of labor due to non-reassuring fetal testing,  progressing well on pitocin  Labor: no change with natural ctx, will start pitocin Preeclampsia:  no signs or symptoms of toxicity Fetal Wellbeing:  Category I Pain Control:  Epidural and Fentanyl I/D:  n/a Anticipated MOD:  NSVD  Claire Riley, Claire Riley 06/02/2013, 2:47 AM

## 2013-06-02 NOTE — Progress Notes (Signed)
UR completed 

## 2013-06-03 ENCOUNTER — Other Ambulatory Visit: Payer: Medicaid Other

## 2013-06-03 ENCOUNTER — Encounter: Payer: Medicaid Other | Admitting: Advanced Practice Midwife

## 2013-06-03 MED ORDER — OXYCODONE-ACETAMINOPHEN 5-325 MG PO TABS
1.0000 | ORAL_TABLET | Freq: Four times a day (QID) | ORAL | Status: DC | PRN
  Administered 2013-06-03 – 2013-06-04 (×3): 2 via ORAL
  Filled 2013-06-03 (×3): qty 2

## 2013-06-03 NOTE — Progress Notes (Signed)
Post Partum Day 1  Subjective: no complaints, up ad lib, voiding, tolerating PO and + flatus.  Patient doing well.  Pain controlled with Ultram and Percocet.  Reports bleeding about the same as yesterday but changing pad every other time she uses the bathroom. Breastfeeding going well.  Plans to circumcise at clinic outside of hospital.  Does not want to go home today.  Plans on Nexplanon for birth control at next post-natal visit, but inquired about interim protection.  Objective: Blood pressure 118/56, pulse 87, temperature 98.2 F (36.8 C), temperature source Oral, resp. rate 20, height 6\' 2"  (1.88 m), weight 119.75 kg (264 lb), last menstrual period 12/07/2012, SpO2 99.00%, unknown if currently breastfeeding.  Physical Exam:  General: alert, cooperative and no distress Lochia: appropriate Uterine Fundus: firm U+1 Incision: no incision DVT Evaluation: No evidence of DVT seen on physical exam. No cords or calf tenderness. No significant calf/ankle edema.   Recent Labs  06/02/13 0012  HGB 9.5*  HCT 30.5*    Assessment/Plan: Plan for discharge tomorrow, Breastfeeding and Contraception Nexplanon at 6 week post-natal visit.  Advised nothing in vagina for the first 6 weeks, but will provide OCP if requested.   LOS: 2 days   Garnette Czech 06/03/2013, 7:55 AM   I have seen and examined this patient and agree with above documentation in the PA's note. Pt wanting to stay until tomorrow.   Rulon Abide, M.D. [facility] Fellow 06/03/2013 8:01 AM

## 2013-06-03 NOTE — Clinical Social Work Maternal (Signed)
Clinical Social Work Department PSYCHOSOCIAL ASSESSMENT - MATERNAL/CHILD 06/03/2013  Patient:  Claire Riley, Claire Riley  Account Number:  [id number]  Admit Date:  06/01/2013  Marjo Bicker Name:   Lezlie Octave    Clinical Social Worker:  Nobie Putnam, LCSW   Date/Time:  06/03/2013 11:33 AM  Date Referred:  06/03/2013   Referral source  CN     Referred reason  Depression/Anxiety  Substance Abuse   Other referral source:    I:  FAMILY / HOME ENVIRONMENT Child's legal guardian:  PARENT  Guardian - Name Guardian - Age Guardian - Address  Omayra Tulloch [address] [place], [place] [address]  [place] [address]    Other household support members/support persons Name Relationship DOB  Shinnite Wilson MOTHER   Dominque Belles SON 2007   Other support:    II  PSYCHOSOCIAL DATA Information Source:  Patient Interview  Event organiser Employment:   Surveyor, quantity resources:  [employer] If Medicaid - County:  [place] Other  Sales executive  WIC   School / Grade:   Maternity Care Coordinator / Child Services Coordination / Early Interventions:   Development worker, international aid  Cultural issues impacting care:    III  STRENGTHS Strengths  Adequate Resources  Home prepared for Child (including basic supplies)  Supportive family/friends   Strength comment:    IV  RISK FACTORS AND CURRENT PROBLEMS Current Problem:     Risk Factor & Current Problem Patient Issue Family Issue Risk Factor / Current Problem Comment  Mental Illness Y N Hx of depression  Substance Abuse Y N Hx of MJ & Etoh use    V  SOCIAL WORK ASSESSMENT CSW met with pt to assess history of MJ use & depression. Pt is currently living with her mother & son.  She admits to smoking MJ "every day," prior to pregnancy confirmation around 20 weeks.  She also admit to drinking alcohol on the weekends, prior to pregnancy.  Once pregnancy was confirmed, she stopped smoking MJ & drinking alcohol.  Pt denies any MJ use since March  however CSW consult states pt smoked 2 weeks ago.  CSW informed pt of hospital drug testing policy & pt verbalized understanding.  UDS is negative, meconium results are pending.  Pt started working with the [facility] in December 2013.  Pt received weekly counseling session & job readiness training until 5/13, when her therapist left the agency.  Pt told CSW that a representative from the agency contacted her & plan to resume services upon discharge from the hospital. She is happy with the services the agency provides & thinks it is helpful resource.  She denies any history of SI or current/recent depression.  Pt has the majority of the infant supplies but expressed a need for appropriate sleeping arrangements.  The pt plans to reach out the staff at the [facility], as well as her Maternity Care Coordinator, [facility]) to ask for assistance.  She appears to be bonding well with the infant.  Pt was accompanied by her mother & an unidentified female, who was asleep.  CSW encouraged pt to follow up community resources she has in place & seek medical attention if PP depression symptoms arise.  Pt agrees with recommendations.  CSW will continue to monitor drug screen results & make a referral if needed.      VI SOCIAL WORK PLAN Social Work Plan  No Further Intervention Required / No Barriers to Discharge   Type of pt/family education:  If child protective services report - county:   If child protective services report - date:   Information/referral to community resources comment:   Other social work plan:      

## 2013-06-04 MED ORDER — IBUPROFEN 600 MG PO TABS: 600.0000 mg | ORAL_TABLET | Freq: Four times a day (QID) | ORAL | Status: DC

## 2013-06-04 NOTE — Discharge Summary (Signed)
Obstetric Discharge Summary Reason for Admission: onset of labor Prenatal Procedures: none Intrapartum Procedures: spontaneous vaginal delivery Postpartum Procedures: none Complications-Operative and Postpartum: periurethral tear Hemoglobin  Date Value Range Status  06/02/2013 9.5* 12.0 - 15.0 g/dL Final     HCT  Date Value Range Status  06/02/2013 30.5* 36.0 - 46.0 % Final   Pt presented in labor and pushed to deliver a liveborn female via svd. Postpartum care uncomplicated. She desires nexplanon for contraception and is breast feeding.   Physical Exam:  General: alert, cooperative and no distress Lochia: appropriate Uterine Fundus: firm Incision: na DVT Evaluation: No evidence of DVT seen on physical exam.  Discharge Diagnoses: Term Pregnancy-delivered  Discharge Information: Date: 06/04/2013 Activity: pelvic rest Diet: routine Medications: PNV, Ibuprofen and Colace Condition: stable Instructions: refer to practice specific booklet Discharge to: home Follow-up Information   Follow up with [facility]. Call in 6 weeks.   Contact information:   [address] [place] [address] [phone]      Newborn Data: Live born female  Birth Weight: 9 lb 7.9 oz (4305 g) APGAR: 8, 9  Home with mother.  Conchita Truxillo L 06/04/2013, 7:42 AM

## 2013-06-09 ENCOUNTER — Encounter: Payer: Self-pay | Admitting: Advanced Practice Midwife

## 2013-06-09 NOTE — H&P (Signed)
Attestation of Attending Supervision of Advanced Practitioner (CNM/NP): Evaluation and management procedures were performed by the Advanced Practitioner under my supervision and collaboration. I have reviewed the Advanced Practitioner's note and chart, and I agree with the management and plan.  Esmee Fallaw H. 3:17 PM   

## 2013-06-09 NOTE — MAU Provider Note (Signed)
Attestation of Attending Supervision of Advanced Practitioner (CNM/NP): Evaluation and management procedures were performed by the Advanced Practitioner under my supervision and collaboration. I have reviewed the Advanced Practitioner's note and chart, and I agree with the management and plan.  Brennin Durfee H. 3:16 PM   

## 2013-07-09 ENCOUNTER — Ambulatory Visit (INDEPENDENT_AMBULATORY_CARE_PROVIDER_SITE_OTHER): Payer: Medicaid Other | Admitting: Obstetrics and Gynecology

## 2013-07-09 ENCOUNTER — Encounter: Payer: Self-pay | Admitting: Obstetrics and Gynecology

## 2013-07-09 NOTE — Progress Notes (Signed)
  Subjective:   .ppp  Claire Riley is a 27 y.o. female who presents for a postpartum visit. She is 6 weeks postpartum following a spontaneous vaginal delivery. I have fully reviewed the prenatal and intrapartum course. The delivery was at 40.2 gestational weeks. Outcome: spontaneous vaginal delivery. Anesthesia: epidural. Postpartum course has been uncomplicated. Baby's course has been uncomplicated. Baby is feeding by breast and bottle. Bleeding no bleeding. Bowel function is normal. Bladder function is normal. Patient is not sexually active. Contraception method is abstinence. Postpartum depression screening: negative. (score 5). Has help with baby. Sleeping OK. Plans return to work in 2 wks.   The following portions of the patient's history were reviewed and updated as appropriate: allergies, current medications, past family history, past medical history, past social history, past surgical history and problem list.  Review of Systems Pertinent items are noted in HPI.   Objective:    BP 118/76  Pulse 96  Ht 6\' 2"  (1.88 m)  Wt 238 lb 6.4 oz (108.138 kg)  BMI 30.6 kg/m2  Breastfeeding? Yes  General:  alert, cooperative and no distress   Breasts:  negative  Lungs: clear to auscultation bilaterally and normal percussion bilaterally  Heart:  regular rate and rhythm, S1, S2 normal, no murmur, click, rub or gallop  Abdomen: large diastasis, NT   Vulva:  normal  Vagina: normal vagina  Cervix:  closed. NT  Corpus: normal size, contour, position, consistency, mobility, non-tender  Adnexa:  no mass, fullness, tenderness  Rectal Exam: Not performed.        Assessment:     5 wk postpartum exam. Pap smear not done at today's visit.   Plan:    1. Contraception: abstinence 2. Return for Nexplanon 3. Follow up in: 2 weeks or as needed.  Pap due in April.

## 2013-07-09 NOTE — Patient Instructions (Signed)

## 2013-08-20 ENCOUNTER — Ambulatory Visit (INDEPENDENT_AMBULATORY_CARE_PROVIDER_SITE_OTHER): Payer: Medicaid Other | Admitting: Obstetrics & Gynecology

## 2013-08-20 ENCOUNTER — Encounter: Payer: Self-pay | Admitting: Obstetrics & Gynecology

## 2013-08-20 VITALS — BP 128/85 | HR 91 | Temp 97.3°F | Ht 74.0 in | Wt 248.9 lb

## 2013-08-20 DIAGNOSIS — Z309 Encounter for contraceptive management, unspecified: Secondary | ICD-10-CM

## 2013-08-20 LAB — POCT PREGNANCY, URINE: Preg Test, Ur: NEGATIVE

## 2013-08-20 NOTE — Progress Notes (Signed)
Patient here for nexplanon insertion, reports unprotected intercourse 3 days ago- upt negative today. Spoke to Dr Macon Large, will not do nexplanon today due to possible pregnancy;  advised patient use condoms or OCPs for next two weeks until her next appt with [place] and if UPT is negative in 2 weeks then she can get nexplanon. Patient stated she would use condoms. Dr Macon Large did not see patient.

## 2013-08-20 NOTE — Patient Instructions (Signed)
Return to clinic for any scheduled appointments or for any gynecologic concerns as needed.   

## 2013-09-03 ENCOUNTER — Encounter: Payer: Self-pay | Admitting: *Deleted

## 2013-09-03 ENCOUNTER — Ambulatory Visit (INDEPENDENT_AMBULATORY_CARE_PROVIDER_SITE_OTHER): Payer: Medicaid Other | Admitting: Obstetrics & Gynecology

## 2013-09-03 ENCOUNTER — Encounter: Payer: Self-pay | Admitting: Obstetrics & Gynecology

## 2013-09-03 VITALS — BP 131/84 | HR 107 | Temp 97.9°F | Ht 74.0 in | Wt 248.5 lb

## 2013-09-03 DIAGNOSIS — Z01812 Encounter for preprocedural laboratory examination: Secondary | ICD-10-CM

## 2013-09-03 DIAGNOSIS — Z30017 Encounter for initial prescription of implantable subdermal contraceptive: Secondary | ICD-10-CM

## 2013-09-03 LAB — POCT PREGNANCY, URINE: Preg Test, Ur: NEGATIVE

## 2013-09-03 MED ORDER — ETONOGESTREL 68 MG ~~LOC~~ IMPL
68.0000 mg | DRUG_IMPLANT | Freq: Once | SUBCUTANEOUS | Status: AC
  Administered 2013-09-03: 68 mg via SUBCUTANEOUS

## 2013-09-03 NOTE — Patient Instructions (Signed)
Etonogestrel implant What is this medicine? ETONOGESTREL (et oh noe JES trel) is a contraceptive (birth control) device. It is used to prevent pregnancy. It can be used for up to 3 years. This medicine may be used for other purposes; ask your health care provider or pharmacist if you have questions. COMMON BRAND NAME(S): Implanon, Nexplanon  What should I tell my health care provider before I take this medicine? They need to know if you have any of these conditions: -abnormal vaginal bleeding -blood vessel disease or blood clots -cancer of the breast, cervix, or liver -depression -diabetes -gallbladder disease -headaches -heart disease or recent heart attack -high blood pressure -high cholesterol -kidney disease -liver disease -renal disease -seizures -tobacco smoker -an unusual or allergic reaction to etonogestrel, other hormones, anesthetics or antiseptics, medicines, foods, dyes, or preservatives -pregnant or trying to get pregnant -breast-feeding How should I use this medicine? This device is inserted just under the skin on the inner side of your upper arm by a health care professional. Talk to your pediatrician regarding the use of this medicine in children. Special care may be needed. Overdosage: If you think you've taken too much of this medicine contact a poison control center or emergency room at once. Overdosage: If you think you have taken too much of this medicine contact a poison control center or emergency room at once. NOTE: This medicine is only for you. Do not share this medicine with others. What if I miss a dose? This does not apply. What may interact with this medicine? Do not take this medicine with any of the following medications: -amprenavir -bosentan -fosamprenavir This medicine may also interact with the following medications: -barbiturate medicines for inducing sleep or treating seizures -certain medicines for fungal infections like ketoconazole and  itraconazole -griseofulvin -medicines to treat seizures like carbamazepine, felbamate, oxcarbazepine, phenytoin, topiramate -modafinil -phenylbutazone -rifampin -some medicines to treat HIV infection like atazanavir, indinavir, lopinavir, nelfinavir, tipranavir, ritonavir -St. John's wort This list may not describe all possible interactions. Give your health care provider a list of all the medicines, herbs, non-prescription drugs, or dietary supplements you use. Also tell them if you smoke, drink alcohol, or use illegal drugs. Some items may interact with your medicine. What should I watch for while using this medicine? This product does not protect you against HIV infection (AIDS) or other sexually transmitted diseases. You should be able to feel the implant by pressing your fingertips over the skin where it was inserted. Tell your doctor if you cannot feel the implant. What side effects may I notice from receiving this medicine? Side effects that you should report to your doctor or health care professional as soon as possible: -allergic reactions like skin rash, itching or hives, swelling of the face, lips, or tongue -breast lumps -changes in vision -confusion, trouble speaking or understanding -dark urine -depressed mood -general ill feeling or flu-like symptoms -light-colored stools -loss of appetite, nausea -right upper belly pain -severe headaches -severe pain, swelling, or tenderness in the abdomen -shortness of breath, chest pain, swelling in a leg -signs of pregnancy -sudden numbness or weakness of the face, arm or leg -trouble walking, dizziness, loss of balance or coordination -unusual vaginal bleeding, discharge -unusually weak or tired -yellowing of the eyes or skin Side effects that usually do not require medical attention (Report these to your doctor or health care professional if they continue or are bothersome.): -acne -breast pain -changes in  weight -cough -fever or chills -headache -irregular menstrual bleeding -itching, burning,   and vaginal discharge -pain or difficulty passing urine -sore throat This list may not describe all possible side effects. Call your doctor for medical advice about side effects. You may report side effects to FDA at 1-800-FDA-1088. Where should I keep my medicine? This drug is given in a hospital or clinic and will not be stored at home. NOTE: This sheet is a summary. It may not cover all possible information. If you have questions about this medicine, talk to your doctor, pharmacist, or health care provider.  2014, [employer]vier/[employer] Standard. (2012-04-21 15:37:45)  

## 2013-09-03 NOTE — Progress Notes (Signed)
GYNECOLOGY CLINIC PROCEDURE NOTE  Claire Riley is a 27 y.o. [id number] here for Nexplanon insertion. No GYN concerns.  Nexplanon Insertion Procedure Patient was given informed consent, she signed consent form.  Patient does understand that irregular bleeding is a very common side effect of this medication. Pregnancy test was negative.  Appropriate time out taken.  Patient's left arm was prepped and draped in the usual sterile fashion.. The ruler used to measure and mark insertion area.  Patient was prepped with alcohol swab and then injected with 3 ml of 1% lidocaine.  She was prepped with betadine, Nexplanon removed from packaging,  Device confirmed in needle, then inserted full length of needle and withdrawn per handbook instructions. Nexplanon was able to palpated in the patient's arm; patient palpated the insert herself. There was minimal blood loss.  Patient insertion site covered with guaze and a pressure bandage to reduce any bruising.  The patient tolerated the procedure well and was given post procedure instructions.   [name], MD, FACOG Attending Obstetrician & Gynecologist Faculty Practice, [facility]

## 2014-08-30 ENCOUNTER — Encounter: Payer: Self-pay | Admitting: Obstetrics & Gynecology

## 2014-10-20 ENCOUNTER — Encounter (HOSPITAL_COMMUNITY): Payer: Self-pay | Admitting: Family Medicine

## 2014-10-20 ENCOUNTER — Emergency Department (HOSPITAL_COMMUNITY): Payer: Medicaid Other

## 2014-10-20 ENCOUNTER — Emergency Department (HOSPITAL_COMMUNITY)
Admission: EM | Admit: 2014-10-20 | Discharge: 2014-10-20 | Disposition: A | Discharge status: Home / Self Care | Payer: Medicaid Other | Attending: Emergency Medicine | Admitting: Emergency Medicine

## 2014-10-20 DIAGNOSIS — Z87440 Personal history of urinary (tract) infections: Secondary | ICD-10-CM | POA: Insufficient documentation

## 2014-10-20 DIAGNOSIS — Z8701 Personal history of pneumonia (recurrent): Secondary | ICD-10-CM | POA: Insufficient documentation

## 2014-10-20 DIAGNOSIS — Z720 Tobacco use: Secondary | ICD-10-CM | POA: Insufficient documentation

## 2014-10-20 DIAGNOSIS — A5901 Trichomonal vulvovaginitis: Secondary | ICD-10-CM | POA: Insufficient documentation

## 2014-10-20 DIAGNOSIS — Z862 Personal history of diseases of the blood and blood-forming organs and certain disorders involving the immune mechanism: Secondary | ICD-10-CM | POA: Insufficient documentation

## 2014-10-20 DIAGNOSIS — Z8659 Personal history of other mental and behavioral disorders: Secondary | ICD-10-CM | POA: Insufficient documentation

## 2014-10-20 DIAGNOSIS — J45909 Unspecified asthma, uncomplicated: Secondary | ICD-10-CM | POA: Insufficient documentation

## 2014-10-20 DIAGNOSIS — Z3202 Encounter for pregnancy test, result negative: Secondary | ICD-10-CM | POA: Insufficient documentation

## 2014-10-20 DIAGNOSIS — B349 Viral infection, unspecified: Secondary | ICD-10-CM

## 2014-10-20 DIAGNOSIS — R42 Dizziness and giddiness: Secondary | ICD-10-CM | POA: Insufficient documentation

## 2014-10-20 LAB — I-STAT CHEM 8, ED
BUN: 5 mg/dL — AB (ref 6–23)
CHLORIDE: 105 meq/L (ref 96–112)
Calcium, Ion: 1.09 mmol/L — ABNORMAL LOW (ref 1.12–1.23)
Creatinine, Ser: 0.7 mg/dL (ref 0.50–1.10)
Glucose, Bld: 91 mg/dL (ref 70–99)
HEMATOCRIT: 43 % (ref 36.0–46.0)
Hemoglobin: 14.6 g/dL (ref 12.0–15.0)
POTASSIUM: 4.2 mmol/L (ref 3.5–5.1)
SODIUM: 139 mmol/L (ref 135–145)
TCO2: 20 mmol/L (ref 0–100)

## 2014-10-20 LAB — URINALYSIS, ROUTINE W REFLEX MICROSCOPIC
Bilirubin Urine: NEGATIVE
GLUCOSE, UA: NEGATIVE mg/dL
HGB URINE DIPSTICK: NEGATIVE
Ketones, ur: NEGATIVE mg/dL
Nitrite: NEGATIVE
PH: 8 (ref 5.0–8.0)
Protein, ur: NEGATIVE mg/dL
SPECIFIC GRAVITY, URINE: 1.02 (ref 1.005–1.030)
Urobilinogen, UA: 1 mg/dL (ref 0.0–1.0)

## 2014-10-20 LAB — URINE MICROSCOPIC-ADD ON

## 2014-10-20 LAB — D-DIMER, QUANTITATIVE (NOT AT ARMC)

## 2014-10-20 LAB — PREGNANCY, URINE: PREG TEST UR: NEGATIVE

## 2014-10-20 MED ORDER — METRONIDAZOLE 500 MG PO TABS: 500.0000 mg | ORAL_TABLET | Freq: Two times a day (BID) | ORAL | Status: DC

## 2014-10-20 MED ORDER — ONDANSETRON HCL 4 MG/2ML IJ SOLN
4.0000 mg | Freq: Once | INTRAMUSCULAR | Status: AC
  Administered 2014-10-20: 4 mg via INTRAVENOUS
  Filled 2014-10-20: qty 2

## 2014-10-20 MED ORDER — ALBUTEROL SULFATE HFA 108 (90 BASE) MCG/ACT IN AERS
1.0000 | INHALATION_SPRAY | RESPIRATORY_TRACT | Status: DC | PRN
  Administered 2014-10-20: 2 via RESPIRATORY_TRACT
  Filled 2014-10-20: qty 6.7

## 2014-10-20 MED ORDER — SODIUM CHLORIDE 0.9 % IV BOLUS (SEPSIS)
1000.0000 mL | Freq: Once | INTRAVENOUS | Status: AC
  Administered 2014-10-20: 1000 mL via INTRAVENOUS

## 2014-10-20 MED ORDER — ONDANSETRON 8 MG PO TBDP: ORAL_TABLET | ORAL | Status: DC

## 2014-10-20 NOTE — ED Notes (Signed)
Per pt here for chest pain x 2 days and every time she stands up she feels like she will black out. sts she also vomited this am and blood was in there. sts hx of asthma.

## 2014-10-20 NOTE — ED Provider Notes (Signed)
CSN: [id number]     Arrival date & time 10/20/14  1113 History   First MD Initiated Contact with Patient 10/20/14 1128     Chief Complaint  Patient presents with  . Chest Pain  . Dizziness     (Consider location/radiation/quality/duration/timing/severity/associated sxs/prior Treatment) HPI Comments: Patient presents with chest pain and congestion. She states over last 2 days she's had a little bit of runny nose and congestion she's had some lightheadedness when she stands up. She's had some pain in the center of her chest which is worse when she coughs and worse when she moves around. She had one episode of vomiting this morning. She does have a history of asthma as a child and she had the similar symptoms about 4 years ago was diagnosed as bronchitis. She does have a cough but says not very bad. She denies any fevers or chills. She has a little bit of achiness. She denies any leg pain or swelling other than yesterday she had some pain in her right knee and set her knee in her lower leg with swelling but it's resolved today. She denies any urinary symptoms. Her last visit menstrual period was November 28.  Patient is a 28 y.o. female presenting with chest pain and dizziness.  Chest Pain Associated symptoms: cough, dizziness, fatigue, nausea and vomiting (1)   Associated symptoms: no abdominal pain, no back pain, no diaphoresis, no fever, no headache, no numbness, no shortness of breath and no weakness   Dizziness Associated symptoms: chest pain, nausea and vomiting (1)   Associated symptoms: no blood in stool, no diarrhea, no headaches and no shortness of breath     Past Medical History  Diagnosis Date  . UTI (lower urinary tract infection)   . Gonorrhea   . Pneumonia   . Depression   . Headache(784.0)   . Trichomonas   . Anemia   . Bronchial asthma     last attack 3137yrs ago   Past Surgical History  Procedure Laterality Date  . Wisdom tooth extraction      2007   Family  History  Problem Relation Age of Onset  . Hypertension Father   . Hypertension Mother   . Diabetes Father   . Diabetes Paternal Grandmother   . Scoliosis Mother   . Scoliosis Sister   . COPD Father    History  Substance Use Topics  . Smoking status: Current Some Day Smoker -- 0.25 packs/day  . Smokeless tobacco: Never Used  . Alcohol Use: No   OB History    Gravida Para Term Preterm AB TAB SAB Ectopic Multiple Living   4 2 2  2  2   2      Review of Systems  Constitutional: Positive for fatigue. Negative for fever, chills and diaphoresis.  HENT: Positive for congestion. Negative for rhinorrhea and sneezing.   Eyes: Negative.   Respiratory: Positive for cough. Negative for chest tightness and shortness of breath.   Cardiovascular: Positive for chest pain. Negative for leg swelling.  Gastrointestinal: Positive for nausea and vomiting (1). Negative for abdominal pain, diarrhea and blood in stool.  Genitourinary: Negative for frequency, hematuria, flank pain and difficulty urinating.  Musculoskeletal: Positive for myalgias and arthralgias. Negative for back pain.  Skin: Negative for rash.  Neurological: Positive for dizziness and light-headedness. Negative for speech difficulty, weakness, numbness and headaches.      Allergies  Tomato  Home Medications   Prior to Admission medications   Medication Sig Start Date  End Date Taking? Authorizing Provider  ibuprofen (ADVIL,MOTRIN) 600 MG tablet Take 1 tablet (600 mg total) by mouth every 6 (six) hours. Patient not taking: Reported on 10/20/2014 06/04/13   Vale Haven, MD  ondansetron (ZOFRAN ODT) 8 MG disintegrating tablet 8mg  ODT q4 hours prn nausea 10/20/14   Rolan Bucco, MD   BP 107/65 mmHg  Pulse 86  Temp(Src) 98 F (36.7 C) (Oral)  Resp 16  Ht 6\' 2"  (1.88 m)  Wt 263 lb (119.296 kg)  BMI 33.75 kg/m2  SpO2 98%  LMP 09/25/2014 Physical Exam  Constitutional: She is oriented to person, place, and time. She appears  well-developed and well-nourished.  HENT:  Head: Normocephalic and atraumatic.  Eyes: Pupils are equal, round, and reactive to light.  Neck: Normal range of motion. Neck supple.  Cardiovascular: Normal rate, regular rhythm and normal heart sounds.   Pulmonary/Chest: Effort normal and breath sounds normal. No respiratory distress. She has no wheezes. She has no rales. She exhibits tenderness (Positive reproducible tenderness over the sternum).  Abdominal: Soft. Bowel sounds are normal. There is no tenderness. There is no rebound and no guarding.  Musculoskeletal: Normal range of motion. She exhibits no edema.  Lymphadenopathy:    She has no cervical adenopathy.  Neurological: She is alert and oriented to person, place, and time.  Skin: Skin is warm and dry. No rash noted.  Psychiatric: She has a normal mood and affect.    ED Course  Procedures (including critical care time) Labs Review Labs Reviewed  URINALYSIS, ROUTINE W REFLEX MICROSCOPIC - Abnormal; Notable for the following:    Leukocytes, UA TRACE (*)    All other components within normal limits  URINE MICROSCOPIC-ADD ON - Abnormal; Notable for the following:    Squamous Epithelial / LPF FEW (*)    All other components within normal limits  I-STAT CHEM 8, ED - Abnormal; Notable for the following:    BUN 5 (*)    Calcium, Ion 1.09 (*)    All other components within normal limits  PREGNANCY, URINE  D-DIMER, QUANTITATIVE    Imaging Review Dg Chest 2 View  10/20/2014   CLINICAL DATA:  Chest pain and difficulty breathing for 3 days  EXAM: CHEST  2 VIEW  COMPARISON:  June 17, 2012  FINDINGS: There is no edema or consolidation. The heart size and pulmonary vascularity are within normal limits. No adenopathy. No pneumothorax. No bone lesions.  IMPRESSION: No edema or consolidation.   Electronically Signed   By: Bretta Bang M.D.   On: 10/20/2014 12:30     EKG Interpretation   Date/Time:  Wednesday October 20 2014  11:16:59 EST Ventricular Rate:  84 PR Interval:  160 QRS Duration: 84 QT Interval:  384 QTC Calculation: 453 R Axis:   16 Text Interpretation:  Normal sinus rhythm Normal ECG since last tracing no  significant change Confirmed by Alexxis Mackert  MD, Lautaro Koral ([id number]) on 10/20/2014  11:29:32 AM      MDM   Final diagnoses:  Viral syndrome    Her CXR is neg for pneumonia.  EKG without ischemia and symptoms do not sound cardiac.  Pt feels better after fluids.  CP is reproducible on chest palpation.  D-dimer is neg and no other suggestions of PE.  Pt feels much better after fluids.  tolerationg PO.  Likely viral.  Give symptomatic care instructions. She was given a prescription for Zofran. She was given return precautions. She's advised to follow with her primary care  physician if her symptoms are not improving.    Rolan BuccoMelanie Michal Callicott, MD 10/20/14 [phone]

## 2014-10-20 NOTE — Discharge Instructions (Signed)
Viral Infections A viral infection can be caused by different types of viruses.Most viral infections are not serious and resolve on their own. However, some infections may cause severe symptoms and may lead to further complications. SYMPTOMS Viruses can frequently cause:  Minor sore throat.  Aches and pains.  Headaches.  Runny nose.  Different types of rashes.  Watery eyes.  Tiredness.  Cough.  Loss of appetite.  Gastrointestinal infections, resulting in nausea, vomiting, and diarrhea. These symptoms do not respond to antibiotics because the infection is not caused by bacteria. However, you might catch a bacterial infection following the viral infection. This is sometimes called a "superinfection." Symptoms of such a bacterial infection may include:  Worsening sore throat with pus and difficulty swallowing.  Swollen neck glands.  Chills and a high or persistent fever.  Severe headache.  Tenderness over the sinuses.  Persistent overall ill feeling (malaise), muscle aches, and tiredness (fatigue).  Persistent cough.  Yellow, green, or brown mucus production with coughing. HOME CARE INSTRUCTIONS   Only take over-the-counter or prescription medicines for pain, discomfort, diarrhea, or fever as directed by your caregiver.  Drink enough water and fluids to keep your urine clear or pale yellow. Sports drinks can provide valuable electrolytes, sugars, and hydration.  Get plenty of rest and maintain proper nutrition. Soups and broths with crackers or rice are fine. SEEK IMMEDIATE MEDICAL CARE IF:   You have severe headaches, shortness of breath, chest pain, neck pain, or an unusual rash.  You have uncontrolled vomiting, diarrhea, or you are unable to keep down fluids.  You or your child has an oral temperature above 102 F (38.9 C), not controlled by medicine.  Your baby is older than 3 months with a rectal temperature of 102 F (38.9 C) or higher.  Your baby is 413  months old or younger with a rectal temperature of 100.4 F (38 C) or higher. MAKE SURE YOU:   Understand these instructions.  Will watch your condition.  Will get help right away if you are not doing well or get worse. Document Released: 07/25/2005 Document Revised: 01/07/2012 Document Reviewed: 02/19/2011 [facility] Patient Information 2015 [employer], [place]. This information is not intended to replace advice given to you by your health care provider. Make sure you discuss any questions you have with your health care provider.  Trichomoniasis Trichomoniasis is an infection caused by an organism called Trichomonas. The infection can affect both women and men. In women, the outer female genitalia and the vagina are affected. In men, the penis is mainly affected, but the prostate and other reproductive organs can also be involved. Trichomoniasis is a sexually transmitted infection (STI) and is most often passed to another person through sexual contact.  RISK FACTORS  Having unprotected sexual intercourse.  Having sexual intercourse with an infected partner. SIGNS AND SYMPTOMS  Symptoms of trichomoniasis in women include:  Abnormal gray-green frothy vaginal discharge.  Itching and irritation of the vagina.  Itching and irritation of the area outside the vagina. Symptoms of trichomoniasis in men include:   Penile discharge with or without pain.  Pain during urination. This results from inflammation of the urethra. DIAGNOSIS  Trichomoniasis may be found during a Pap test or physical exam. Your health care provider may use one of the following methods to help diagnose this infection:  Examining vaginal discharge under a microscope. For men, urethral discharge would be examined.  Testing the pH of the vagina with a test tape.  Using a vaginal swab  test that checks for the Trichomonas organism. A test is available that provides results within a few minutes.  Doing a culture test for the  organism. This is not usually needed. TREATMENT   You may be given medicine to fight the infection. Women should inform their health care provider if they could be or are pregnant. Some medicines used to treat the infection should not be taken during pregnancy.  Your health care provider may recommend over-the-counter medicines or creams to decrease itching or irritation.  Your sexual partner will need to be treated if infected. HOME CARE INSTRUCTIONS   Take medicines only as directed by your health care provider.  Take over-the-counter medicine for itching or irritation as directed by your health care provider.  Do not have sexual intercourse while you have the infection.  Women should not douche or wear tampons while they have the infection.  Discuss your infection with your partner. Your partner may have gotten the infection from you, or you may have gotten it from your partner.  Have your sex partner get examined and treated if necessary.  Practice safe, informed, and protected sex.  See your health care provider for other STI testing. SEEK MEDICAL CARE IF:   You still have symptoms after you finish your medicine.  You develop abdominal pain.  You have pain when you urinate.  You have bleeding after sexual intercourse.  You develop a rash.  Your medicine makes you sick or makes you throw up (vomit). MAKE SURE YOU:  Understand these instructions.  Will watch your condition.  Will get help right away if you are not doing well or get worse. Document Released: 04/10/2001 Document Revised: 03/01/2014 Document Reviewed: 07/27/2013 [facility] Patient Information 2015 [place], [place]. This information is not intended to replace advice given to you by your health care provider. Make sure you discuss any questions you have with your health care provider.

## 2014-10-20 NOTE — ED Notes (Signed)
Pt demonstrated proper technique of inhaler. Pt verbalized understanding of prescriptions and dr's instructions. Pt has no further questions.

## 2015-03-05 ENCOUNTER — Encounter (HOSPITAL_COMMUNITY): Payer: Self-pay | Admitting: Emergency Medicine

## 2015-03-05 ENCOUNTER — Emergency Department (HOSPITAL_COMMUNITY)
Admission: EM | Admit: 2015-03-05 | Discharge: 2015-03-05 | Disposition: A | Discharge status: Home / Self Care | Payer: Medicaid Other | Attending: Emergency Medicine | Admitting: Emergency Medicine

## 2015-03-05 ENCOUNTER — Emergency Department (HOSPITAL_COMMUNITY): Payer: Medicaid Other

## 2015-03-05 DIAGNOSIS — Z8701 Personal history of pneumonia (recurrent): Secondary | ICD-10-CM | POA: Insufficient documentation

## 2015-03-05 DIAGNOSIS — Z87440 Personal history of urinary (tract) infections: Secondary | ICD-10-CM | POA: Insufficient documentation

## 2015-03-05 DIAGNOSIS — J45909 Unspecified asthma, uncomplicated: Secondary | ICD-10-CM | POA: Insufficient documentation

## 2015-03-05 DIAGNOSIS — B349 Viral infection, unspecified: Secondary | ICD-10-CM | POA: Insufficient documentation

## 2015-03-05 DIAGNOSIS — Z79899 Other long term (current) drug therapy: Secondary | ICD-10-CM | POA: Insufficient documentation

## 2015-03-05 DIAGNOSIS — Z8619 Personal history of other infectious and parasitic diseases: Secondary | ICD-10-CM | POA: Insufficient documentation

## 2015-03-05 DIAGNOSIS — R0789 Other chest pain: Secondary | ICD-10-CM

## 2015-03-05 DIAGNOSIS — Z862 Personal history of diseases of the blood and blood-forming organs and certain disorders involving the immune mechanism: Secondary | ICD-10-CM | POA: Insufficient documentation

## 2015-03-05 DIAGNOSIS — R059 Cough, unspecified: Secondary | ICD-10-CM

## 2015-03-05 DIAGNOSIS — Z8659 Personal history of other mental and behavioral disorders: Secondary | ICD-10-CM | POA: Insufficient documentation

## 2015-03-05 DIAGNOSIS — Z793 Long term (current) use of hormonal contraceptives: Secondary | ICD-10-CM | POA: Insufficient documentation

## 2015-03-05 DIAGNOSIS — Z720 Tobacco use: Secondary | ICD-10-CM | POA: Insufficient documentation

## 2015-03-05 DIAGNOSIS — R05 Cough: Secondary | ICD-10-CM

## 2015-03-05 LAB — BASIC METABOLIC PANEL
Anion gap: 7 (ref 5–15)
BUN: 7 mg/dL (ref 6–20)
CALCIUM: 8.9 mg/dL (ref 8.9–10.3)
CO2: 23 mmol/L (ref 22–32)
Chloride: 108 mmol/L (ref 101–111)
Creatinine, Ser: 0.76 mg/dL (ref 0.44–1.00)
GFR calc Af Amer: >60 mL/min (ref >60)
GFR calc non Af Amer: >60 mL/min (ref >60)
Glucose, Bld: 95 mg/dL (ref 70–99)
POTASSIUM: 3.6 mmol/L (ref 3.5–5.1)
Sodium: 138 mmol/L (ref 135–145)

## 2015-03-05 LAB — I-STAT TROPONIN, ED: TROPONIN I, POC: 0 ng/mL (ref 0.00–0.08)

## 2015-03-05 LAB — CBC
HEMATOCRIT: 40.5 % (ref 36.0–46.0)
Hemoglobin: 13.6 g/dL (ref 12.0–15.0)
MCH: 27.8 pg (ref 26.0–34.0)
MCHC: 33.6 g/dL (ref 30.0–36.0)
MCV: 82.8 fL (ref 78.0–100.0)
Platelets: 167 10*3/uL (ref 150–400)
RBC: 4.89 MIL/uL (ref 3.87–5.11)
RDW: 13.4 % (ref 11.5–15.5)
WBC: 3.9 10*3/uL — AB (ref 4.0–10.5)

## 2015-03-05 MED ORDER — ALBUTEROL SULFATE HFA 108 (90 BASE) MCG/ACT IN AERS
2.0000 | INHALATION_SPRAY | Freq: Once | RESPIRATORY_TRACT | Status: AC
  Administered 2015-03-05: 2 via RESPIRATORY_TRACT
  Filled 2015-03-05: qty 6.7

## 2015-03-05 MED ORDER — SODIUM CHLORIDE 0.9 % IV BOLUS (SEPSIS): 1000.0000 mL | Freq: Once | INTRAVENOUS | Status: DC

## 2015-03-05 MED ORDER — IPRATROPIUM-ALBUTEROL 0.5-2.5 (3) MG/3ML IN SOLN
3.0000 mL | RESPIRATORY_TRACT | Status: DC
  Filled 2015-03-05: qty 3

## 2015-03-05 MED ORDER — GUAIFENESIN-CODEINE 100-10 MG/5ML PO SOLN: 5.0000 mL | Freq: Three times a day (TID) | ORAL | Status: DC | PRN

## 2015-03-05 NOTE — ED Provider Notes (Signed)
CSN: [id number]     Arrival date & time 03/05/15  1212 History   First MD Initiated Contact with Patient 03/05/15 1230     Chief Complaint  Patient presents with  . Chest Pain     (Consider location/radiation/quality/duration/timing/severity/associated sxs/prior Treatment) HPI Claire Riley is a 29 y.o. female comes in for evaluation of chest discomfort. Patient states for the past 3 weeks she has had a central chest pressure that feels like "somebody has their elbow or knee right on the middle of my chest". She also reports associated cough, sore throat and mild headache and sinus congestion. Chest discomfort is worse with coughing. She has not taken anything to improve her symptoms. Reports a remote history of asthma. Denies any recent travel, surgeries, unilateral leg swelling, hemoptysis, personal history of cancer, history of clot. She does have Nexplanon and reports she is menstruating now. No other aggravating or modifying factors.  Past Medical History  Diagnosis Date  . UTI (lower urinary tract infection)   . Gonorrhea   . Pneumonia   . Depression   . Headache(784.0)   . Trichomonas   . Anemia   . Bronchial asthma     last attack 3674yrs ago   Past Surgical History  Procedure Laterality Date  . Wisdom tooth extraction      2007   Family History  Problem Relation Age of Onset  . Hypertension Father   . Hypertension Mother   . Diabetes Father   . Diabetes Paternal Grandmother   . Scoliosis Mother   . Scoliosis Sister   . COPD Father    History  Substance Use Topics  . Smoking status: Current Some Day Smoker -- 0.25 packs/day  . Smokeless tobacco: Never Used  . Alcohol Use: No   OB History    Gravida Para Term Preterm AB TAB SAB Ectopic Multiple Living   4 2 2  2  2   2      Review of Systems A 10 point review of systems was completed and was negative except for pertinent positives and negatives as mentioned in the history of present illness     Allergies   Tomato  Home Medications   Prior to Admission medications   Medication Sig Start Date End Date Taking? Authorizing Provider  etonogestrel (NEXPLANON) 68 MG IMPL implant 1 each by Subdermal route once.   Yes Historical Provider, MD  Multiple Vitamins-Minerals (MULTIVITAMIN WITH MINERALS) tablet Take 1 tablet by mouth daily.   Yes Historical Provider, MD  guaiFENesin-codeine 100-10 MG/5ML syrup Take 5 mLs by mouth 3 (three) times daily as needed for cough. 03/05/15   Joycie PeekBenjamin Lastacia Solum, PA-C  ibuprofen (ADVIL,MOTRIN) 600 MG tablet Take 1 tablet (600 mg total) by mouth every 6 (six) hours. Patient not taking: Reported on 10/20/2014 06/04/13   Vale HavenKeli L Beck, MD  metroNIDAZOLE (FLAGYL) 500 MG tablet Take 1 tablet (500 mg total) by mouth 2 (two) times daily. One po bid x 7 days Patient not taking: Reported on 03/05/2015 10/20/14   Rolan BuccoMelanie Belfi, MD  ondansetron (ZOFRAN ODT) 8 MG disintegrating tablet 8mg  ODT q4 hours prn nausea Patient not taking: Reported on 03/05/2015 10/20/14   Rolan BuccoMelanie Belfi, MD   BP 105/73 mmHg  Pulse 87  Temp(Src) 98.6 F (37 C) (Oral)  Resp 16  SpO2 96%  LMP 03/04/2015 Physical Exam  Constitutional: She is oriented to person, place, and time. She appears well-developed and well-nourished. No distress.  HENT:  Head: Normocephalic and atraumatic.  Mouth/Throat: Oropharynx  is clear and moist.  Eyes: Conjunctivae are normal. Pupils are equal, round, and reactive to light. Right eye exhibits no discharge. Left eye exhibits no discharge. No scleral icterus.  Neck: Normal range of motion. Neck supple.  Cardiovascular: Normal rate, regular rhythm and normal heart sounds.   Pulmonary/Chest: Effort normal and breath sounds normal. No respiratory distress. She has no wheezes. She has no rales.  Chest discomfort is reproduced with palpation of lower sternum and surrounding chondral spaces  Abdominal: Soft. There is no tenderness.  Musculoskeletal: She exhibits no tenderness.  No leg  swelling, erythema or tenderness along the venous system.  Neurological: She is alert and oriented to person, place, and time.  Cranial Nerves II-XII grossly intact  Skin: Skin is warm and dry. No rash noted. She is not diaphoretic.  Psychiatric: She has a normal mood and affect.  Nursing note and vitals reviewed.   ED Course  Procedures (including critical care time) Labs Review Labs Reviewed  CBC - Abnormal; Notable for the following:    WBC 3.9 (*)    All other components within normal limits  BASIC METABOLIC PANEL  I-STAT TROPOININ, ED    Imaging Review Dg Chest 2 View  03/05/2015   CLINICAL DATA:  Mid chest pain for the past 2 weeks, worse last night, radiating to the left shoulder and under the left breast. Cough this morning. Smoker.  EXAM: CHEST  2 VIEW  COMPARISON:  10/20/2014.  FINDINGS: Normal sized heart. Clear lungs. Minimal central peribronchial thickening without significant change. Normal appearing bones.  IMPRESSION: Stable minimal chronic bronchitic changes.  No acute abnormality.   Electronically Signed   By: Beckie SaltsSteven  Reid M.D.   On: 03/05/2015 13:14     EKG Interpretation None     Meds given in ED:  Medications  sodium chloride 0.9 % bolus 1,000 mL (not administered)  ipratropium-albuterol (DUONEB) 0.5-2.5 (3) MG/3ML nebulizer solution 3 mL (not administered)  albuterol (PROVENTIL HFA;VENTOLIN HFA) 108 (90 BASE) MCG/ACT inhaler 2 puff (not administered)    New Prescriptions   GUAIFENESIN-CODEINE 100-10 MG/5ML SYRUP    Take 5 mLs by mouth 3 (three) times daily as needed for cough.   Filed Vitals:   03/05/15 1223 03/05/15 1427 03/05/15 1445 03/05/15 1500  BP: 115/76 104/57 107/65 105/73  Pulse: 106 94 89 87  Temp: 98.6 F (37 C)     TempSrc: Oral     Resp: 14 16  16   SpO2: 96% 98% 98% 96%    MDM  Vitals stable - WNL -afebrile Pt resting comfortably in ED. PE--normal lung exam. Grossly benign physical exam Labwork-labs noncontributory. EKG  reassuring. Troponin negative Imaging--chest x-ray shows chronic bronchitic changes without acute cardiopulmonary pathology. On reevaluation at 14:50 p.m., patient has not received medications or saline bolus.  DDX--patient likely suffering from acute bronchitis versus other viral syndrome. Discussed discharge with cough medicines, pain medicines, albuterol inhaler and patient agrees with plan. Patient's vitals are stable and there is clinically no evidence of DVT and a low wells PE score. Exam and history of present illness not consistent with ACS or other acute or emergent pathology.  I discussed all relevant lab findings and imaging results with pt and they verbalized understanding. Discussed f/u with PCP within 48 hrs and return precautions, pt very amenable to plan.  Final diagnoses:  Chest discomfort  Cough  Viral syndrome        Joycie PeekBenjamin Luian Schumpert, PA-C 03/05/15 1553  Joycie PeekBenjamin Damarco Keysor, PA-C 03/05/15 1554  Harrold DonathNathan  Rubin Payor, MD 03/07/15 [phone]

## 2015-03-05 NOTE — ED Notes (Signed)
Pt. Stated, Ive had chest pain for about 3 weeks off and on.

## 2015-03-05 NOTE — Discharge Instructions (Signed)
You were evaluated in the ED today for your chest discomfort, headache, cough and there does not appear to be an emergent cause for your symptoms at this time. Your labs, x-ray and exam were all reassuring today. Youre likely suffering from a viral syndrome. It is important for you to take your medications as prescribed. Do not take cough syrup before driving or operating machinery. Follow up with PCP for further evaluation and management of symptoms. Return to ED for new or worsening symptoms.  Viral Infections A viral infection can be caused by different types of viruses.Most viral infections are not serious and resolve on their own. However, some infections may cause severe symptoms and may lead to further complications. SYMPTOMS Viruses can frequently cause:  Minor sore throat.  Aches and pains.  Headaches.  Runny nose.  Different types of rashes.  Watery eyes.  Tiredness.  Cough.  Loss of appetite.  Gastrointestinal infections, resulting in nausea, vomiting, and diarrhea. These symptoms do not respond to antibiotics because the infection is not caused by bacteria. However, you might catch a bacterial infection following the viral infection. This is sometimes called a "superinfection." Symptoms of such a bacterial infection may include:  Worsening sore throat with pus and difficulty swallowing.  Swollen neck glands.  Chills and a high or persistent fever.  Severe headache.  Tenderness over the sinuses.  Persistent overall ill feeling (malaise), muscle aches, and tiredness (fatigue).  Persistent cough.  Yellow, green, or brown mucus production with coughing. HOME CARE INSTRUCTIONS   Only take over-the-counter or prescription medicines for pain, discomfort, diarrhea, or fever as directed by your caregiver.  Drink enough water and fluids to keep your urine clear or pale yellow. Sports drinks can provide valuable electrolytes, sugars, and hydration.  Get plenty of  rest and maintain proper nutrition. Soups and broths with crackers or rice are fine. SEEK IMMEDIATE MEDICAL CARE IF:   You have severe headaches, shortness of breath, chest pain, neck pain, or an unusual rash.  You have uncontrolled vomiting, diarrhea, or you are unable to keep down fluids.  You or your child has an oral temperature above 102 F (38.9 C), not controlled by medicine.  Your baby is older than 3 months with a rectal temperature of 102 F (38.9 C) or higher.  Your baby is 703 months old or younger with a rectal temperature of 100.4 F (38 C) or higher. MAKE SURE YOU:   Understand these instructions.  Will watch your condition.  Will get help right away if you are not doing well or get worse. Document Released: 07/25/2005 Document Revised: 01/07/2012 Document Reviewed: 02/19/2011 [facility] Patient Information 2015 [place], [place]. This information is not intended to replace advice given to you by your health care provider. Make sure you discuss any questions you have with your health care provider.  Cough, Adult  A cough is a reflex that helps clear your throat and airways. It can help heal the body or may be a reaction to an irritated airway. A cough may only last 2 or 3 weeks (acute) or may last more than 8 weeks (chronic).  CAUSES Acute cough:  Viral or bacterial infections. Chronic cough:  Infections.  Allergies.  Asthma.  Post-nasal drip.  Smoking.  Heartburn or acid reflux.  Some medicines.  Chronic lung problems (COPD).  Cancer. SYMPTOMS   Cough.  Fever.  Chest pain.  Increased breathing rate.  High-pitched whistling sound when breathing (wheezing).  Colored mucus that you cough up (sputum).  TREATMENT   A bacterial cough may be treated with antibiotic medicine.  A viral cough must run its course and will not respond to antibiotics.  Your caregiver may recommend other treatments if you have a chronic cough. HOME CARE INSTRUCTIONS     Only take over-the-counter or prescription medicines for pain, discomfort, or fever as directed by your caregiver. Use cough suppressants only as directed by your caregiver.  Use a cold steam vaporizer or humidifier in your bedroom or home to help loosen secretions.  Sleep in a semi-upright position if your cough is worse at night.  Rest as needed.  Stop smoking if you smoke. SEEK IMMEDIATE MEDICAL CARE IF:   You have pus in your sputum.  Your cough starts to worsen.  You cannot control your cough with suppressants and are losing sleep.  You begin coughing up blood.  You have difficulty breathing.  You develop pain which is getting worse or is uncontrolled with medicine.  You have a fever. MAKE SURE YOU:   Understand these instructions.  Will watch your condition.  Will get help right away if you are not doing well or get worse. Document Released: 04/13/2011 Document Revised: 01/07/2012 Document Reviewed: 04/13/2011 [facility] Patient Information 2015 [place], [place]. This information is not intended to replace advice given to you by your health care provider. Make sure you discuss any questions you have with your health care provider.

## 2015-03-06 ENCOUNTER — Emergency Department (HOSPITAL_COMMUNITY): Payer: Medicaid Other

## 2015-03-06 ENCOUNTER — Encounter (HOSPITAL_COMMUNITY): Payer: Self-pay | Admitting: *Deleted

## 2015-03-06 ENCOUNTER — Emergency Department (HOSPITAL_COMMUNITY)
Admission: EM | Admit: 2015-03-06 | Discharge: 2015-03-06 | Disposition: A | Discharge status: Home / Self Care | Payer: Self-pay | Attending: Emergency Medicine | Admitting: Emergency Medicine

## 2015-03-06 DIAGNOSIS — Z862 Personal history of diseases of the blood and blood-forming organs and certain disorders involving the immune mechanism: Secondary | ICD-10-CM | POA: Insufficient documentation

## 2015-03-06 DIAGNOSIS — Z792 Long term (current) use of antibiotics: Secondary | ICD-10-CM | POA: Insufficient documentation

## 2015-03-06 DIAGNOSIS — R059 Cough, unspecified: Secondary | ICD-10-CM

## 2015-03-06 DIAGNOSIS — R0600 Dyspnea, unspecified: Secondary | ICD-10-CM

## 2015-03-06 DIAGNOSIS — Z8619 Personal history of other infectious and parasitic diseases: Secondary | ICD-10-CM | POA: Insufficient documentation

## 2015-03-06 DIAGNOSIS — Z87440 Personal history of urinary (tract) infections: Secondary | ICD-10-CM | POA: Insufficient documentation

## 2015-03-06 DIAGNOSIS — R05 Cough: Secondary | ICD-10-CM

## 2015-03-06 DIAGNOSIS — Z8701 Personal history of pneumonia (recurrent): Secondary | ICD-10-CM | POA: Insufficient documentation

## 2015-03-06 DIAGNOSIS — J45901 Unspecified asthma with (acute) exacerbation: Secondary | ICD-10-CM | POA: Insufficient documentation

## 2015-03-06 DIAGNOSIS — Z720 Tobacco use: Secondary | ICD-10-CM | POA: Insufficient documentation

## 2015-03-06 DIAGNOSIS — Z8659 Personal history of other mental and behavioral disorders: Secondary | ICD-10-CM | POA: Insufficient documentation

## 2015-03-06 DIAGNOSIS — Z3202 Encounter for pregnancy test, result negative: Secondary | ICD-10-CM | POA: Insufficient documentation

## 2015-03-06 DIAGNOSIS — Z79899 Other long term (current) drug therapy: Secondary | ICD-10-CM | POA: Insufficient documentation

## 2015-03-06 LAB — I-STAT BETA HCG BLOOD, ED (MC, WL, AP ONLY): I-stat hCG, quantitative: <5 m[IU]/mL (ref <5)

## 2015-03-06 MED ORDER — SODIUM CHLORIDE 0.9 % IV BOLUS (SEPSIS)
1000.0000 mL | Freq: Once | INTRAVENOUS | Status: AC
  Administered 2015-03-06: 1000 mL via INTRAVENOUS

## 2015-03-06 MED ORDER — ONDANSETRON HCL 4 MG/2ML IJ SOLN
4.0000 mg | Freq: Once | INTRAMUSCULAR | Status: AC
  Administered 2015-03-06: 4 mg via INTRAVENOUS
  Filled 2015-03-06: qty 2

## 2015-03-06 MED ORDER — ACETAMINOPHEN 325 MG PO TABS
650.0000 mg | ORAL_TABLET | Freq: Once | ORAL | Status: AC
  Administered 2015-03-06: 650 mg via ORAL
  Filled 2015-03-06: qty 2

## 2015-03-06 MED ORDER — GUAIFENESIN-CODEINE 100-10 MG/5ML PO SOLN
10.0000 mL | Freq: Once | ORAL | Status: AC
  Administered 2015-03-06: 10 mL via ORAL
  Filled 2015-03-06: qty 10

## 2015-03-06 MED ORDER — PREDNISONE 20 MG PO TABS
40.0000 mg | ORAL_TABLET | Freq: Once | ORAL | Status: AC
Start: 2015-03-06 — End: 2015-03-06
  Administered 2015-03-06: 40 mg via ORAL
  Filled 2015-03-06: qty 2

## 2015-03-06 MED ORDER — PREDNISONE 20 MG PO TABS: 40.0000 mg | ORAL_TABLET | Freq: Every day | ORAL | Status: DC

## 2015-03-06 MED ORDER — IOHEXOL 350 MG/ML SOLN
75.0000 mL | Freq: Once | INTRAVENOUS | Status: AC | PRN
  Administered 2015-03-06: 75 mL via INTRAVENOUS

## 2015-03-06 MED ORDER — KETOROLAC TROMETHAMINE 15 MG/ML IJ SOLN
15.0000 mg | Freq: Once | INTRAMUSCULAR | Status: AC
  Administered 2015-03-06: 15 mg via INTRAVENOUS
  Filled 2015-03-06: qty 1

## 2015-03-06 NOTE — ED Notes (Signed)
Total body aches with a cold cough chest congestion.  Dark colored sputum low grade temp  No fever reducer today.  She was seen here yesterday and was worked up she is no better and she reports that her chest  Hurts more.  No distress.  lmp now

## 2015-03-06 NOTE — ED Notes (Signed)
She was diagniosed with brionchitis yesterday

## 2015-03-16 NOTE — ED Provider Notes (Signed)
CSN: [id number]     Arrival date & time 03/06/15  1617 History   First MD Initiated Contact with Patient 03/06/15 1712     Chief Complaint  Patient presents with  . Generalized Body Aches     (Consider location/radiation/quality/duration/timing/severity/associated sxs/prior Treatment) HPI   29 year old female with chest pain. Left-sided. Sharp. Worse with deep breathing and certain movements. Patient was recently evaluated in the emergency room and diagnosed with bronchitis. She reports continued symptoms. Denies any recent travel, recent surgical procedures, unilateral leg pain or swelling, hemoptysis previous DVT or history of cancer. Subjective fever.  Past Medical History  Diagnosis Date  . UTI (lower urinary tract infection)   . Gonorrhea   . Pneumonia   . Depression   . Headache(784.0)   . Trichomonas   . Anemia   . Bronchial asthma     last attack 8050yrs ago   Past Surgical History  Procedure Laterality Date  . Wisdom tooth extraction      2007   Family History  Problem Relation Age of Onset  . Hypertension Father   . Hypertension Mother   . Diabetes Father   . Diabetes Paternal Grandmother   . Scoliosis Mother   . Scoliosis Sister   . COPD Father    History  Substance Use Topics  . Smoking status: Current Some Day Smoker -- 0.25 packs/day  . Smokeless tobacco: Never Used  . Alcohol Use: No   OB History    Gravida Para Term Preterm AB TAB SAB Ectopic Multiple Living   4 2 2  2  2   2      Review of Systems  All systems reviewed and negative, other than as noted in HPI.   Allergies  Tomato  Home Medications   Prior to Admission medications   Medication Sig Start Date End Date Taking? Authorizing Provider  acetaminophen (TYLENOL) 325 MG tablet Take 650 mg by mouth every 6 (six) hours as needed for mild pain.   Yes Historical Provider, MD  etonogestrel (NEXPLANON) 68 MG IMPL implant 1 each by Subdermal route once.   Yes Historical Provider, MD   guaiFENesin-codeine 100-10 MG/5ML syrup Take 5 mLs by mouth 3 (three) times daily as needed for cough. 03/05/15  Yes Joycie PeekBenjamin Cartner, PA-C  Multiple Vitamins-Minerals (MULTIVITAMIN WITH MINERALS) tablet Take 1 tablet by mouth daily.   Yes Historical Provider, MD  ondansetron (ZOFRAN ODT) 8 MG disintegrating tablet 8mg  ODT q4 hours prn nausea 10/20/14  Yes Rolan BuccoMelanie Belfi, MD  ibuprofen (ADVIL,MOTRIN) 600 MG tablet Take 1 tablet (600 mg total) by mouth every 6 (six) hours. Patient not taking: Reported on 10/20/2014 06/04/13   Vale HavenKeli L Beck, MD  metroNIDAZOLE (FLAGYL) 500 MG tablet Take 1 tablet (500 mg total) by mouth 2 (two) times daily. One po bid x 7 days Patient not taking: Reported on 03/05/2015 10/20/14   Rolan BuccoMelanie Belfi, MD  predniSONE (DELTASONE) 20 MG tablet Take 2 tablets (40 mg total) by mouth daily. 03/06/15   Raeford RazorStephen Kimaria Struthers, MD   BP 99/54 mmHg  Pulse 100  Temp(Src) 98.5 F (36.9 C) (Oral)  Resp 18  Ht 6\' 2"  (1.88 m)  Wt 253 lb (114.76 kg)  BMI 32.47 kg/m2  SpO2 96%  LMP 03/04/2015 Physical Exam  Constitutional: She appears well-developed and well-nourished. No distress.  HENT:  Head: Normocephalic and atraumatic.  Eyes: Conjunctivae are normal. Right eye exhibits no discharge. Left eye exhibits no discharge.  Neck: Neck supple.  Cardiovascular: Normal rate, regular rhythm  and normal heart sounds.  Exam reveals no gallop and no friction rub.   No murmur heard. Pulmonary/Chest: Effort normal. No respiratory distress. She has wheezes. She exhibits tenderness.  Mild wheezing. No increased work of breathing. Speaks in complete sentences. Mild tenderness to palpation in the axillary region lateral left chest wall. No overlying skin changes.  Abdominal: Soft. She exhibits no distension. There is no tenderness.  Musculoskeletal: She exhibits no edema or tenderness.  Lower extremities symmetric as compared to each other. No calf tenderness. Negative Homan's. No palpable cords.   Neurological:  She is alert.  Skin: Skin is warm and dry.  Psychiatric: She has a normal mood and affect. Her behavior is normal. Thought content normal.  Nursing note and vitals reviewed.   ED Course  Procedures (including critical care time) Labs Review Labs Reviewed  I-STAT BETA HCG BLOOD, ED ([facility], [facility], [facility] ONLY)    Imaging Review No results found.   EKG Interpretation   Date/Time:  Sunday Mar 06 2015 16:26:31 EDT Ventricular Rate:  128 PR Interval:  150 QRS Duration: 80 QT Interval:  300 QTC Calculation: 438 R Axis:   37 Text Interpretation:  Sinus tachycardia Otherwise normal ECG ED PHYSICIAN  INTERPRETATION AVAILABLE IN [facility] Confirmed by TEST, Record  ([id number]) on 03/08/2015 7:14:42 AM      MDM   Final diagnoses:  Cough  Dyspnea    29 year old female which has been cough. Likely viral bronchitis. Mild wheezing, but no significant increased work of breathing. Normal oxygen saturations. Imaging without significant acute abnormality. Plan course of steroids and continuation to treatment. It has been determined that no acute conditions requiring further emergency intervention are present at this time. The patient has been advised of the diagnosis and plan. I reviewed any labs and imaging including any potential incidental findings. We have discussed signs and symptoms that warrant return to the ED and they are listed in the discharge instructions.      Raeford RazorStephen Majorie Santee, MD 03/16/15 [phone]

## 2017-05-01 ENCOUNTER — Encounter (HOSPITAL_COMMUNITY): Payer: Self-pay | Admitting: Emergency Medicine

## 2017-05-01 ENCOUNTER — Emergency Department (HOSPITAL_COMMUNITY): Payer: Self-pay

## 2017-05-01 ENCOUNTER — Emergency Department (HOSPITAL_COMMUNITY)
Admission: EM | Admit: 2017-05-01 | Discharge: 2017-05-01 | Disposition: A | Discharge status: Home / Self Care | Payer: Self-pay | Attending: Emergency Medicine | Admitting: Emergency Medicine

## 2017-05-01 DIAGNOSIS — X509XXA Other and unspecified overexertion or strenuous movements or postures, initial encounter: Secondary | ICD-10-CM | POA: Insufficient documentation

## 2017-05-01 DIAGNOSIS — Y929 Unspecified place or not applicable: Secondary | ICD-10-CM | POA: Insufficient documentation

## 2017-05-01 DIAGNOSIS — D649 Anemia, unspecified: Secondary | ICD-10-CM | POA: Insufficient documentation

## 2017-05-01 DIAGNOSIS — S29019A Strain of muscle and tendon of unspecified wall of thorax, initial encounter: Secondary | ICD-10-CM | POA: Insufficient documentation

## 2017-05-01 DIAGNOSIS — Z79899 Other long term (current) drug therapy: Secondary | ICD-10-CM | POA: Insufficient documentation

## 2017-05-01 DIAGNOSIS — N3001 Acute cystitis with hematuria: Secondary | ICD-10-CM | POA: Insufficient documentation

## 2017-05-01 DIAGNOSIS — T148XXA Other injury of unspecified body region, initial encounter: Secondary | ICD-10-CM

## 2017-05-01 DIAGNOSIS — F17210 Nicotine dependence, cigarettes, uncomplicated: Secondary | ICD-10-CM | POA: Insufficient documentation

## 2017-05-01 DIAGNOSIS — Y999 Unspecified external cause status: Secondary | ICD-10-CM | POA: Insufficient documentation

## 2017-05-01 DIAGNOSIS — Y9389 Activity, other specified: Secondary | ICD-10-CM | POA: Insufficient documentation

## 2017-05-01 LAB — COMPREHENSIVE METABOLIC PANEL
ALBUMIN: 3.9 g/dL (ref 3.5–5.0)
ALT: 21 U/L (ref 14–54)
AST: 21 U/L (ref 15–41)
Alkaline Phosphatase: 71 U/L (ref 38–126)
Anion gap: 8 (ref 5–15)
BUN: 7 mg/dL (ref 6–20)
CHLORIDE: 104 mmol/L (ref 101–111)
CO2: 22 mmol/L (ref 22–32)
Calcium: 9 mg/dL (ref 8.9–10.3)
Creatinine, Ser: 0.8 mg/dL (ref 0.44–1.00)
GFR calc Af Amer: >60 mL/min (ref >60)
GFR calc non Af Amer: >60 mL/min (ref >60)
Glucose, Bld: 96 mg/dL (ref 65–99)
POTASSIUM: 3.8 mmol/L (ref 3.5–5.1)
Sodium: 134 mmol/L — ABNORMAL LOW (ref 135–145)
Total Bilirubin: 1.2 mg/dL (ref 0.3–1.2)
Total Protein: 6.7 g/dL (ref 6.5–8.1)

## 2017-05-01 LAB — URINALYSIS, ROUTINE W REFLEX MICROSCOPIC
Bilirubin Urine: NEGATIVE
Glucose, UA: NEGATIVE mg/dL
Ketones, ur: NEGATIVE mg/dL
Nitrite: NEGATIVE
Protein, ur: 100 mg/dL — AB
SPECIFIC GRAVITY, URINE: 1.012 (ref 1.005–1.030)
pH: 6 (ref 5.0–8.0)

## 2017-05-01 LAB — CBC WITH DIFFERENTIAL/PLATELET
BASOS ABS: 0 10*3/uL (ref 0.0–0.1)
BASOS PCT: 0 %
Eosinophils Absolute: 0 10*3/uL (ref 0.0–0.7)
Eosinophils Relative: 0 %
HEMATOCRIT: 40 % (ref 36.0–46.0)
Hemoglobin: 13 g/dL (ref 12.0–15.0)
Lymphocytes Relative: 20 %
Lymphs Abs: 1.8 10*3/uL (ref 0.7–4.0)
MCH: 27.5 pg (ref 26.0–34.0)
MCHC: 32.5 g/dL (ref 30.0–36.0)
MCV: 84.6 fL (ref 78.0–100.0)
MONO ABS: 0.6 10*3/uL (ref 0.1–1.0)
Monocytes Relative: 6 %
NEUTROS ABS: 6.8 10*3/uL (ref 1.7–7.7)
NEUTROS PCT: 74 %
Platelets: 174 10*3/uL (ref 150–400)
RBC: 4.73 MIL/uL (ref 3.87–5.11)
RDW: 13 % (ref 11.5–15.5)
WBC: 9.3 10*3/uL (ref 4.0–10.5)

## 2017-05-01 LAB — POC URINE PREG, ED: PREG TEST UR: NEGATIVE

## 2017-05-01 MED ORDER — CEPHALEXIN 250 MG PO CAPS
500.0000 mg | ORAL_CAPSULE | Freq: Once | ORAL | Status: AC
  Administered 2017-05-01: 500 mg via ORAL
  Filled 2017-05-01: qty 2

## 2017-05-01 MED ORDER — NAPROXEN 250 MG PO TABS
500.0000 mg | ORAL_TABLET | Freq: Once | ORAL | Status: AC
  Administered 2017-05-01: 500 mg via ORAL
  Filled 2017-05-01: qty 2

## 2017-05-01 MED ORDER — NAPROXEN 500 MG PO TABS: 500.0000 mg | ORAL_TABLET | Freq: Two times a day (BID) | ORAL | 0 refills | Status: DC

## 2017-05-01 MED ORDER — METHOCARBAMOL 500 MG PO TABS
750.0000 mg | ORAL_TABLET | Freq: Once | ORAL | Status: AC
  Administered 2017-05-01: 750 mg via ORAL
  Filled 2017-05-01: qty 2

## 2017-05-01 MED ORDER — SODIUM CHLORIDE 0.9 % IV BOLUS (SEPSIS)
1000.0000 mL | Freq: Once | INTRAVENOUS | Status: AC
  Administered 2017-05-01: 1000 mL via INTRAVENOUS

## 2017-05-01 MED ORDER — PHENAZOPYRIDINE HCL 200 MG PO TABS: 200.0000 mg | ORAL_TABLET | Freq: Three times a day (TID) | ORAL | 0 refills | Status: DC

## 2017-05-01 MED ORDER — CEPHALEXIN 500 MG PO CAPS: 500.0000 mg | ORAL_CAPSULE | Freq: Two times a day (BID) | ORAL | 0 refills | Status: DC

## 2017-05-01 NOTE — Discharge Instructions (Signed)
Take your antibiotic as prescribed until completed. Continue drinking fluids at home to remain hydrated. Take your pain medication as prescribed as needed for pain relief. You may also apply ice to affected area for 15-20 minutes ever 3-4 hours as needed for pain relief. I recommend refraining from doing any heavy lifting or strenuous activity for the next week. Please follow up with a primary care provider from the Resource Guide provided below in 4-5 days if your symptoms have not improved. Please return to the Emergency Department if symptoms worsen or new onset of fever, chest pain, difficulty breathing, abdominal pain, vomiting, worsening back pain.

## 2017-05-01 NOTE — ED Provider Notes (Signed)
[facility]-EMERGENCY DEPT Provider Note   CSN: [id number] Arrival date & time: 05/01/17  1012     History   Chief Complaint Chief Complaint  Patient presents with  . Back Pain    HPI Claire Riley is a 31 y.o. female.  HPI   Pt is a 31 yo female with no PMH who presents to the ED with complaint of left mid back pain, onset 3 days. Pt reports helping her grandmother move furniture a week ago But states she did not start noticing pain in her left lower back until 3 days ago. She notes pain is worse with movement or palpation. Denies any other recent fall, trauma or injury. She also reports having dysuria for the past few days. Denies fever, chills, HA, CP, SOB, abdominal pain, N/V, diarrhea, hematuria, vaginal bleeding/discharge. Denies urinary/bowel incontinence, saddle anesthesia, numbness, weakness. Denies hx CA, IVDU, recent spinal manipulation. Reports taking Advil at home with mild intermittent relief.   Past Medical History:  Diagnosis Date  . Anemia   . Bronchial asthma    last attack 5438yrs ago  . Depression   . Gonorrhea   . Headache(784.0)   . Pneumonia   . Trichomonas   . UTI (lower urinary tract infection)     There are no active problems to display for this patient.   Past Surgical History:  Procedure Laterality Date  . WISDOM TOOTH EXTRACTION     2007    OB History    Gravida Para Term Preterm AB Living   4 2 2   2 2    SAB TAB Ectopic Multiple Live Births   2       2       Home Medications    Prior to Admission medications   Medication Sig Start Date End Date Taking? Authorizing Provider  acetaminophen (TYLENOL) 325 MG tablet Take 650 mg by mouth every 6 (six) hours as needed for mild pain.   Yes [provider]  etonogestrel (NEXPLANON) 68 MG IMPL implant 1 each by Subdermal route once.   Yes [provider]  ibuprofen (ADVIL,MOTRIN) 600 MG tablet Take 1 tablet (600 mg total) by mouth every 6 (six) hours. 06/04/13  Yes Vale HavenBeck, Keli L, MD    Multiple Vitamins-Minerals (MULTIVITAMIN WITH MINERALS) tablet Take 1 tablet by mouth daily.   Yes [provider]  cephALEXin (KEFLEX) 500 MG capsule Take 1 capsule (500 mg total) by mouth 2 (two) times daily. 05/01/17   Barrett HenleNadeau, Anedra Penafiel Elizabeth, PA-C  guaiFENesin-codeine 100-10 MG/5ML syrup Take 5 mLs by mouth 3 (three) times daily as needed for cough. Patient not taking: Reported on 05/01/2017 03/05/15   Joycie Peekartner, Benjamin, PA-C  metroNIDAZOLE (FLAGYL) 500 MG tablet Take 1 tablet (500 mg total) by mouth 2 (two) times daily. One po bid x 7 days Patient not taking: Reported on 03/05/2015 10/20/14   Rolan BuccoBelfi, Melanie, MD  naproxen (NAPROSYN) 500 MG tablet Take 1 tablet (500 mg total) by mouth 2 (two) times daily. 05/01/17   Barrett HenleNadeau, Rosalyn Archambault Elizabeth, PA-C  ondansetron (ZOFRAN ODT) 8 MG disintegrating tablet 8mg  ODT q4 hours prn nausea Patient not taking: Reported on 05/01/2017 10/20/14   Rolan BuccoBelfi, Melanie, MD  phenazopyridine (PYRIDIUM) 200 MG tablet Take 1 tablet (200 mg total) by mouth 3 (three) times daily. 05/01/17   Barrett HenleNadeau, Merridith Dershem Elizabeth, PA-C  predniSONE (DELTASONE) 20 MG tablet Take 2 tablets (40 mg total) by mouth daily. Patient not taking: Reported on 05/01/2017 03/06/15   Raeford RazorKohut, Stephen, MD  Family History Family History  Problem Relation Age of Onset  . Hypertension Father   . Hypertension Mother   . Diabetes Father   . Diabetes Paternal Grandmother   . Scoliosis Mother   . Scoliosis Sister   . COPD Father     Social History Social History  Substance Use Topics  . Smoking status: Current Some Day Smoker    Packs/day: 0.25  . Smokeless tobacco: Never Used  . Alcohol use No     Allergies   Tomato   Review of Systems Review of Systems  Genitourinary: Positive for dysuria.  Musculoskeletal: Positive for back pain.  All other systems reviewed and are negative.    Physical Exam Updated Vital Signs BP 117/86   Pulse 87   Temp 98.9 F (37.2 C) (Oral)   Resp 18   SpO2  98%   Physical Exam  Constitutional: She is oriented to person, place, and time. She appears well-developed and well-nourished. No distress.  HENT:  Head: Normocephalic and atraumatic.  Mouth/Throat: Oropharynx is clear and moist. No oropharyngeal exudate.  Eyes: Conjunctivae and EOM are normal. Right eye exhibits no discharge. Left eye exhibits no discharge. No scleral icterus.  Neck: Normal range of motion. Neck supple.  Cardiovascular: Normal rate, regular rhythm, normal heart sounds and intact distal pulses.   Pulmonary/Chest: Effort normal and breath sounds normal. No respiratory distress. She has no wheezes. She has no rales. She exhibits no tenderness.  Abdominal: Soft. Normal appearance and bowel sounds are normal. She exhibits no distension and no mass. There is no tenderness. There is no rigidity, no rebound, no guarding and no CVA tenderness. No hernia.  Musculoskeletal: Normal range of motion. She exhibits tenderness. She exhibits no edema or deformity.  No midline C, T, or L tenderness. Mild TTP over left mid thoracic paraspinal muscles. Full range of motion of neck and back. Full range of motion of bilateral upper and lower extremities, with 5/5 strength. Sensation intact. 2+ radial and PT pulses. Cap refill <2 seconds. Patient able to stand and ambulate without assistance.    Neurological: She is alert and oriented to person, place, and time.  Skin: Skin is warm and dry. She is not diaphoretic.  Nursing note and vitals reviewed.    ED Treatments / Results  Labs (all labs ordered are listed, but only abnormal results are displayed) Labs Reviewed  URINALYSIS, ROUTINE W REFLEX MICROSCOPIC - Abnormal; Notable for the following:       Result Value   APPearance CLOUDY (*)    Hgb urine dipstick MODERATE (*)    Protein, ur 100 (*)    Leukocytes, UA LARGE (*)    Bacteria, UA FEW (*)    Squamous Epithelial / LPF 0-5 (*)    All other components within normal limits  COMPREHENSIVE  METABOLIC PANEL - Abnormal; Notable for the following:    Sodium 134 (*)    All other components within normal limits  CBC WITH DIFFERENTIAL/PLATELET  POC URINE PREG, ED    EKG  EKG Interpretation None       Radiology Ct Renal Stone Study  Result Date: 05/01/2017 CLINICAL DATA:  Left flank pain for 2 days. EXAM: CT ABDOMEN AND PELVIS WITHOUT CONTRAST TECHNIQUE: Multidetector CT imaging of the abdomen and pelvis was performed following the standard protocol without IV contrast. COMPARISON:  None. FINDINGS: Lower chest: Linear subsegmental atelectasis in the lung bases. Heart is normal size. No effusions. Hepatobiliary: No focal hepatic abnormality. Gallbladder unremarkable. Pancreas:  No focal abnormality or ductal dilatation. Spleen: No focal abnormality.  Normal size. Adrenals/Urinary Tract: No adrenal abnormality. No focal renal abnormality. No stones or hydronephrosis. Urinary bladder is unremarkable. Stomach/Bowel: Normal appendix. Vascular/Lymphatic: No evidence of aneurysm or adenopathy. Reproductive: 5.3 cm cyst in the right adnexa/ ovary. Uterus and left ovary unremarkable. Other: No free fluid or free air. Musculoskeletal: No acute bony abnormality. IMPRESSION: 5.3 cm right ovarian cyst. No renal or ureteral stones.  No hydronephrosis. Electronically Signed   By: Charlett Nose M.D.   On: 05/01/2017 12:59    Procedures Procedures (including critical care time)  Medications Ordered in ED Medications  naproxen (NAPROSYN) tablet 500 mg (500 mg Oral Given 05/01/17 1056)  methocarbamol (ROBAXIN) tablet 750 mg (750 mg Oral Given 05/01/17 1056)  sodium chloride 0.9 % bolus 1,000 mL (1,000 mLs Intravenous New Bag/Given 05/01/17 1240)  cephALEXin (KEFLEX) capsule 500 mg (500 mg Oral Given 05/01/17 1344)     Initial Impression / Assessment and Plan / ED Course  I have reviewed the triage vital signs and the nursing notes.  Pertinent labs & imaging results that were available during my care of  the patient were reviewed by me and considered in my medical decision making (see chart for details).    Pt presents with left mid back pain that has been present for the past 3 days with associated dysuria. Reports lifting furniture a week ago but denies any other recent injury. No back pain red flags. Denies fever or abdominal pain. VSS. Exam showed mild TTP over left midthoracic paraspinal muscles. Abdomen soft, nontender, no CVA tenderness. UA showed hgb, leuks, WBCs and bacteria. Will order labs and CT renal study due to concern for possible infected stone. Pt given IVF and pain meds. Pregnancy negative. Labs unremarkable. CT showed 5.3cm right ovarian cyst, otherwise unremarkable; no renal or ureteral stones, no hydronephrosis. Suspect sxs due to UTI, pt given initial dose of antibiotics in the ED. Pt may also have muscle strain over right mid thoracic pain from recent heavy lifting, no concern for spinal cord compression, epidural abscess or cauda equina syndrome. On reevaluation pt reports improvement of sxs, tolerating PO. Discussed results and plan for d/c. Plan to d/c home with antibiotics and symptomatic tx for muscle strain. Discussed strict return precautions.   Final Clinical Impressions(s) / ED Diagnoses   Final diagnoses:  Acute cystitis with hematuria  Muscle strain    New Prescriptions New Prescriptions   CEPHALEXIN (KEFLEX) 500 MG CAPSULE    Take 1 capsule (500 mg total) by mouth 2 (two) times daily.   NAPROXEN (NAPROSYN) 500 MG TABLET    Take 1 tablet (500 mg total) by mouth 2 (two) times daily.   PHENAZOPYRIDINE (PYRIDIUM) 200 MG TABLET    Take 1 tablet (200 mg total) by mouth 3 (three) times daily.     Barrett Henle, PA-C 05/01/17 1352    Melene Plan, DO 05/01/17 [phone]

## 2017-05-01 NOTE — ED Triage Notes (Signed)
Pt sts left sided mid back pain worse with inspiration and movement x 3 days

## 2017-05-01 NOTE — ED Notes (Signed)
Pt was moving furniture last week, started having a "small pain" in lower back, now pain has spread-- hurts to breath, hurts to move, hurts to lift arm over head.

## 2019-02-25 ENCOUNTER — Ambulatory Visit (INDEPENDENT_AMBULATORY_CARE_PROVIDER_SITE_OTHER): Payer: Self-pay

## 2019-02-25 ENCOUNTER — Other Ambulatory Visit: Payer: Self-pay

## 2019-02-25 DIAGNOSIS — Z3202 Encounter for pregnancy test, result negative: Secondary | ICD-10-CM

## 2019-02-25 LAB — POCT PREGNANCY, URINE: Preg Test, Ur: NEGATIVE

## 2019-02-25 NOTE — Progress Notes (Signed)
I have reviewed the chart and agree with nursing staff's documentation of this patient's encounter.  Vonzella Nipple, PA-C 02/25/2019 11:14 AM

## 2019-02-25 NOTE — Progress Notes (Signed)
Pt came to office today for pregnancy test resulted negative.  Pt reports that her LMP was 01/24/19.  I explained to the pt that she has not yet missed her period.  I encouraged pt to wait until she misses her period and if she does then to retest.  Pt verbalized understanding.

## 2019-03-24 ENCOUNTER — Telehealth (INDEPENDENT_AMBULATORY_CARE_PROVIDER_SITE_OTHER): Payer: Self-pay | Admitting: *Deleted

## 2019-03-24 DIAGNOSIS — N939 Abnormal uterine and vaginal bleeding, unspecified: Secondary | ICD-10-CM

## 2019-03-24 NOTE — Telephone Encounter (Signed)
Returned pt's call regarding her bleeding.  Pt states light vaginal bleeding with no clots and pink when she wipes.  Pt states "strings of some kind of something in it"  Pt states it has been going on for 5 days and is early for her period.  Advised pt that she can have irregular periods and that as long as it's not lasting for weeks at a time or is very heavy with large blood clots, there is not need to worry.  Advised pt to call the clinic if she experiencing any of those symptoms.  Pt verbalized understanding.

## 2019-03-24 NOTE — Telephone Encounter (Signed)
-----   Message from Lanney Gins, CMA sent at 03/24/2019 10:35 AM EDT ----- Regarding: pt call Pt called to our office to discuss bleeding she is having.  She was recently seen in [facility].  Can you please call pt to discuss current problem?    Thanks

## 2019-04-01 ENCOUNTER — Ambulatory Visit (INDEPENDENT_AMBULATORY_CARE_PROVIDER_SITE_OTHER): Payer: Medicaid Other | Admitting: Emergency Medicine

## 2019-04-01 ENCOUNTER — Other Ambulatory Visit: Payer: Self-pay

## 2019-04-01 DIAGNOSIS — Z3202 Encounter for pregnancy test, result negative: Secondary | ICD-10-CM | POA: Diagnosis not present

## 2019-04-01 LAB — POCT PREGNANCY, URINE: Preg Test, Ur: NEGATIVE

## 2019-04-01 NOTE — Progress Notes (Addendum)
Pt here today for pregnancy test. UPT negative. Pt states "I just wanted to come and make sure". Pt also reported having irregular periods that had been previously addressed. Pt had no further questions, comments, or concerns.    Chart reviewed for nurse visit. Agree with plan of care.   Currie Paris, NP 04/01/2019 5:07 PM

## 2019-09-30 ENCOUNTER — Encounter (HOSPITAL_COMMUNITY): Payer: Self-pay | Admitting: Emergency Medicine

## 2019-09-30 ENCOUNTER — Emergency Department (HOSPITAL_COMMUNITY)
Admission: EM | Admit: 2019-09-30 | Discharge: 2019-09-30 | Discharge status: Against Medical Advice | Payer: Self-pay | Attending: Emergency Medicine | Admitting: Emergency Medicine

## 2019-09-30 ENCOUNTER — Emergency Department (HOSPITAL_COMMUNITY): Payer: Self-pay

## 2019-09-30 ENCOUNTER — Other Ambulatory Visit: Payer: Self-pay

## 2019-09-30 DIAGNOSIS — Z5321 Procedure and treatment not carried out due to patient leaving prior to being seen by health care provider: Secondary | ICD-10-CM | POA: Insufficient documentation

## 2019-09-30 LAB — BASIC METABOLIC PANEL
Anion gap: 13 (ref 5–15)
BUN: 9 mg/dL (ref 6–20)
CO2: 21 mmol/L — ABNORMAL LOW (ref 22–32)
Calcium: 9.2 mg/dL (ref 8.9–10.3)
Chloride: 104 mmol/L (ref 98–111)
Creatinine, Ser: 0.84 mg/dL (ref 0.44–1.00)
GFR calc Af Amer: >60 mL/min (ref >60)
GFR calc non Af Amer: >60 mL/min (ref >60)
Glucose, Bld: 106 mg/dL — ABNORMAL HIGH (ref 70–99)
Potassium: 3.3 mmol/L — ABNORMAL LOW (ref 3.5–5.1)
Sodium: 138 mmol/L (ref 135–145)

## 2019-09-30 LAB — CBC
HCT: 33.8 % — ABNORMAL LOW (ref 36.0–46.0)
Hemoglobin: 10.3 g/dL — ABNORMAL LOW (ref 12.0–15.0)
MCH: 22 pg — ABNORMAL LOW (ref 26.0–34.0)
MCHC: 30.5 g/dL (ref 30.0–36.0)
MCV: 72.1 fL — ABNORMAL LOW (ref 80.0–100.0)
Platelets: 339 10*3/uL (ref 150–400)
RBC: 4.69 MIL/uL (ref 3.87–5.11)
RDW: 17.2 % — ABNORMAL HIGH (ref 11.5–15.5)
WBC: 7.2 10*3/uL (ref 4.0–10.5)
nRBC: 0 % (ref 0.0–0.2)

## 2019-09-30 LAB — TROPONIN I (HIGH SENSITIVITY)
Troponin I (High Sensitivity): 3 ng/L (ref <18)
Troponin I (High Sensitivity): 3 ng/L (ref <18)

## 2019-09-30 LAB — I-STAT BETA HCG BLOOD, ED (MC, WL, AP ONLY): I-stat hCG, quantitative: <5 m[IU]/mL (ref <5)

## 2019-09-30 MED ORDER — SODIUM CHLORIDE 0.9% FLUSH: 3.0000 mL | Freq: Once | INTRAVENOUS | Status: DC

## 2019-09-30 NOTE — ED Triage Notes (Signed)
Patient with shortness of breath and chest pain that started about 3 hours ago.  She states that she did have some nausea, no vomiting.  She states that she was resting, her heart rate slowed, then sped up then she states that it felt like it was pounding out of her chest.

## 2019-09-30 NOTE — ED Notes (Signed)
Pt states she has to take her kids to an appointment this morning so she can no longer wait. Tech informed her to please come back if her condition got worse or did not improve

## 2020-04-05 ENCOUNTER — Other Ambulatory Visit: Payer: Self-pay

## 2020-04-05 ENCOUNTER — Emergency Department (HOSPITAL_COMMUNITY): Payer: Medicaid Other

## 2020-04-05 ENCOUNTER — Emergency Department (HOSPITAL_COMMUNITY)
Admission: EM | Admit: 2020-04-05 | Discharge: 2020-04-05 | Disposition: A | Discharge status: Home / Self Care | Payer: Medicaid Other | Attending: Emergency Medicine | Admitting: Emergency Medicine

## 2020-04-05 ENCOUNTER — Encounter (HOSPITAL_COMMUNITY): Payer: Self-pay

## 2020-04-05 ENCOUNTER — Ambulatory Visit (HOSPITAL_COMMUNITY)
Admission: EM | Admit: 2020-04-05 | Discharge: 2020-04-05 | Disposition: A | Discharge status: Home / Self Care | Payer: Medicaid Other

## 2020-04-05 DIAGNOSIS — J36 Peritonsillar abscess: Secondary | ICD-10-CM

## 2020-04-05 DIAGNOSIS — Z79899 Other long term (current) drug therapy: Secondary | ICD-10-CM | POA: Insufficient documentation

## 2020-04-05 DIAGNOSIS — M542 Cervicalgia: Secondary | ICD-10-CM | POA: Diagnosis not present

## 2020-04-05 DIAGNOSIS — F17200 Nicotine dependence, unspecified, uncomplicated: Secondary | ICD-10-CM | POA: Diagnosis not present

## 2020-04-05 DIAGNOSIS — Z20822 Contact with and (suspected) exposure to covid-19: Secondary | ICD-10-CM | POA: Insufficient documentation

## 2020-04-05 DIAGNOSIS — Z03818 Encounter for observation for suspected exposure to other biological agents ruled out: Secondary | ICD-10-CM | POA: Diagnosis not present

## 2020-04-05 DIAGNOSIS — J029 Acute pharyngitis, unspecified: Secondary | ICD-10-CM | POA: Diagnosis present

## 2020-04-05 LAB — BASIC METABOLIC PANEL
Anion gap: 11 (ref 5–15)
BUN: 8 mg/dL (ref 6–20)
CO2: 22 mmol/L (ref 22–32)
Calcium: 9.2 mg/dL (ref 8.9–10.3)
Chloride: 105 mmol/L (ref 98–111)
Creatinine, Ser: 0.72 mg/dL (ref 0.44–1.00)
GFR calc Af Amer: >60 mL/min (ref >60)
GFR calc non Af Amer: >60 mL/min (ref >60)
Glucose, Bld: 97 mg/dL (ref 70–99)
Potassium: 4.6 mmol/L (ref 3.5–5.1)
Sodium: 138 mmol/L (ref 135–145)

## 2020-04-05 LAB — SARS CORONAVIRUS 2 BY RT PCR (HOSPITAL ORDER, PERFORMED IN ~~LOC~~ HOSPITAL LAB): SARS Coronavirus 2: NEGATIVE

## 2020-04-05 MED ORDER — CLINDAMYCIN PHOSPHATE 600 MG/50ML IV SOLN
600.0000 mg | Freq: Once | INTRAVENOUS | Status: AC
  Administered 2020-04-05: 600 mg via INTRAVENOUS
  Filled 2020-04-05: qty 50

## 2020-04-05 MED ORDER — DEXAMETHASONE SODIUM PHOSPHATE 10 MG/ML IJ SOLN
10.0000 mg | Freq: Once | INTRAMUSCULAR | Status: AC
  Administered 2020-04-05: 10 mg via INTRAVENOUS
  Filled 2020-04-05: qty 1

## 2020-04-05 MED ORDER — SODIUM CHLORIDE 0.9 % IV BOLUS
1000.0000 mL | Freq: Once | INTRAVENOUS | Status: AC
  Administered 2020-04-05: 1000 mL via INTRAVENOUS

## 2020-04-05 MED ORDER — HYDROCODONE-ACETAMINOPHEN 5-325 MG PO TABS: 1.0000 | ORAL_TABLET | ORAL | 0 refills | Status: DC | PRN

## 2020-04-05 MED ORDER — IOHEXOL 300 MG/ML  SOLN
75.0000 mL | Freq: Once | INTRAMUSCULAR | Status: AC | PRN
  Administered 2020-04-05: 75 mL via INTRAVENOUS

## 2020-04-05 MED ORDER — MORPHINE SULFATE (PF) 4 MG/ML IV SOLN
4.0000 mg | Freq: Once | INTRAVENOUS | Status: AC
  Administered 2020-04-05: 4 mg via INTRAVENOUS
  Filled 2020-04-05: qty 1

## 2020-04-05 MED ORDER — CLINDAMYCIN HCL 300 MG PO CAPS: 300.0000 mg | ORAL_CAPSULE | Freq: Three times a day (TID) | ORAL | 0 refills | Status: AC

## 2020-04-05 NOTE — Discharge Instructions (Addendum)
You were transferred to the ED with a right peritonsillar abscess that was identified on imaging, ENT was consulted in the ED and drained at bedside.  He was given steroids and antibiotics while in the ED.  A prescription was written for p.o. antibiotics as well as narcotic pain medication.  Also recommend taking naproxen or your preferred NSAID for pain control scheduled for the next several days.  Please follow-up with [facility] throat in 1 week if you do not have your own ear nose and throat doctor.

## 2020-04-05 NOTE — ED Triage Notes (Signed)
Pt sent by [facility] for further evaluation of tonsil abcess. Pt reports pain for the past week. Pt maintaining secretions.

## 2020-04-05 NOTE — Consult Note (Signed)
Reason for Consult:PTA Referring Physician: er  Claire Riley is an 34 y.o. female.  HPI: hx of sore throat throat for 1 week and right sided symptoms no hx of tonsillitis. Taking fluids still. No hx of PTA.  Past Medical History:  Diagnosis Date  . Anemia   . Bronchial asthma    last attack 14yrs ago  . Depression   . Gonorrhea   . Headache(784.0)   . Pneumonia   . Trichomonas   . UTI (lower urinary tract infection)     Past Surgical History:  Procedure Laterality Date  . WISDOM TOOTH EXTRACTION     2007    Family History  Problem Relation Age of Onset  . Hypertension Father   . Diabetes Father   . COPD Father   . Hypertension Mother   . Scoliosis Mother   . Diabetes Paternal Grandmother   . Scoliosis Sister     Social History:  reports that she has been smoking. She has been smoking about 0.25 packs per day. She has never used smokeless tobacco. She reports current drug use. Drug: Marijuana. She reports that she does not drink alcohol.  Allergies:  Allergies  Allergen Reactions  . Tomato Hives    Medications: I have reviewed the patient's current medications.  Results for orders placed or performed during the hospital encounter of 04/05/20 (from the past 48 hour(s))  Basic metabolic panel     Status: None   Collection Time: 04/05/20 11:46 AM  Result Value Ref Range   Sodium 138 135 - 145 mmol/L   Potassium 4.6 3.5 - 5.1 mmol/L    Comment: SLIGHT HEMOLYSIS   Chloride 105 98 - 111 mmol/L   CO2 22 22 - 32 mmol/L   Glucose, Bld 97 70 - 99 mg/dL    Comment: Glucose reference range applies only to samples taken after fasting for at least 8 hours.   BUN 8 6 - 20 mg/dL   Creatinine, Ser 0.35 0.44 - 1.00 mg/dL   Calcium 9.2 8.9 - 00.9 mg/dL   GFR calc non Af Amer >60 >60 mL/min   GFR calc Af Amer >60 >60 mL/min   Anion gap 11 5 - 15    Comment: Performed at [facility], [address], [place], [place] [address]    CT Soft Tissue Neck W  Contrast  Result Date: 04/05/2020 CLINICAL DATA:  Pain, possible peritonsillar abscess EXAM: CT NECK WITH CONTRAST TECHNIQUE: Multidetector CT imaging of the neck was performed using the standard protocol following the bolus administration of intravenous contrast. CONTRAST:  30mL OMNIPAQUE IOHEXOL 300 MG/ML  SOLN COMPARISON:  None. FINDINGS: Pharynx and larynx: Enlarged and edematous right palatine tonsil. There is an area of more discrete low-attenuation measuring approximately 0.9 x 1.5 x 0.9 cm. There is right parapharyngeal fat infiltration. Airway is patent. Salivary glands: Unremarkable. Thyroid: Normal. Lymph nodes: Reactive suprahyoid right cervical lymph nodes. Largest node measures 2 cm. Vascular: Major neck vessels are patent. Limited intracranial: No abnormal enhancement. Visualized orbits: Minimally imaged. Mastoids and visualized paranasal sinuses: Mild paranasal sinus mucosal thickening. Visualized mastoids are clear. Skeleton: Partially imaged probably chronic left nasal bone fracture. Upper chest: Included upper lungs are clear. Other: None. IMPRESSION: Findings consistent with tonsillitis and right peritonsillar abscess. Patent airway. Electronically Signed   By: Guadlupe Spanish M.D.   On: 04/05/2020 13:31    Review of Systems Blood pressure 111/73, pulse 85, temperature 98.7 F (37.1 C), temperature source Oral, resp. rate 20,  height 6\' 2"  (1.88 m), weight 99.3 kg, last menstrual period 03/16/2020, SpO2 100 %. Physical Exam  Constitutional: She appears well-developed and well-nourished.  HENT:  Head: Normocephalic.  Nose: Nose normal.  Oral cavity/oropharynx-she does not have much trismus.  She does have a hot potato voice.  Her right peritonsillar region is fairly significantly swollen.  The uvula is midline and slightly edematous.  Eyes: Pupils are equal, round, and reactive to light. Conjunctivae are normal.  Musculoskeletal:     Cervical back: Normal range of motion and neck  supple.    Assessment/Plan: Peritonsillar abscess right-she had a right-sided abscess that was drained with incision and drainage because she was a week with symptoms and had a fair amount of swelling.  She was informed the risk and benefits of the procedure and options were discussed all questions were answered and consent was obtained.  Procedure: The patient was sprayed with Cetacaine and then injected with 1% lidocaine with 1 100,000 epinephrine.  An incision was made over the peritonsillar region with a 15 blade and a tonsil hemostat was used to open up the space which did have some purulent material expressed.  Minimal bleeding.  She tolerated the procedure well.  She will follow-up with clindamycin to go home with 300 3 times daily and some Lortab.  She will follow-up with [facility] ear nose and throat in 1 week sooner if worse.  Melissa Montane 04/05/2020, 5:06 PM

## 2020-04-05 NOTE — ED Provider Notes (Signed)
[facility]    CSN: [id number] Arrival date & time: 04/05/20  5277      History   Chief Complaint Chief Complaint  Patient presents with  . Sore Throat    HPI Claire Riley is a 34 y.o. female.   Patient presents with 1 week sore throat.  She reports over the last 2 to 3 days it significantly worsened.  She reports significant pain with swallowing and feeling swelling on the right side.  She has noticed some increased saliva.  She reports it is pain that limits her ability to swallow as opposed to things going down difficult.  She reports she has been drinking fluids however is not been eating much food due to the pain.  She denies fever or chills.  She does report right-sided her neck and ear hurt as well.  No known sick contacts.  No other associated symptoms of cough or other upper respiratory symptoms     Past Medical History:  Diagnosis Date  . Anemia   . Bronchial asthma    last attack 61yrs ago  . Depression   . Gonorrhea   . Headache(784.0)   . Pneumonia   . Trichomonas   . UTI (lower urinary tract infection)     There are no problems to display for this patient.   Past Surgical History:  Procedure Laterality Date  . WISDOM TOOTH EXTRACTION     2007    OB History    Gravida  4   Para  2   Term  2   Preterm      AB  2   Living  2     SAB  2   TAB      Ectopic      Multiple      Live Births  2            Home Medications    Prior to Admission medications   Medication Sig Start Date End Date Taking? Authorizing Provider  acetaminophen (TYLENOL) 325 MG tablet Take 650 mg by mouth every 6 (six) hours as needed for mild pain.    [provider]  cephALEXin (KEFLEX) 500 MG capsule Take 1 capsule (500 mg total) by mouth 2 (two) times daily. 05/01/17   Barrett Henle, PA-C  etonogestrel (NEXPLANON) 68 MG IMPL implant 1 each by Subdermal route once.    [provider]  guaiFENesin-codeine 100-10  MG/5ML syrup Take 5 mLs by mouth 3 (three) times daily as needed for cough. Patient not taking: Reported on 05/01/2017 03/05/15   Joycie Peek, PA-C  ibuprofen (ADVIL,MOTRIN) 600 MG tablet Take 1 tablet (600 mg total) by mouth every 6 (six) hours. 06/04/13   Vale Haven, MD  metroNIDAZOLE (FLAGYL) 500 MG tablet Take 1 tablet (500 mg total) by mouth 2 (two) times daily. One po bid x 7 days Patient not taking: Reported on 03/05/2015 10/20/14   Rolan Bucco, MD  Multiple Vitamins-Minerals (MULTIVITAMIN WITH MINERALS) tablet Take 1 tablet by mouth daily.    [provider]  naproxen (NAPROSYN) 500 MG tablet Take 1 tablet (500 mg total) by mouth 2 (two) times daily. 05/01/17   Barrett Henle, PA-C  ondansetron (ZOFRAN ODT) 8 MG disintegrating tablet 8mg  ODT q4 hours prn nausea Patient not taking: Reported on 05/01/2017 10/20/14   10/22/14, MD  phenazopyridine (PYRIDIUM) 200 MG tablet Take 1 tablet (200 mg total) by mouth 3 (three) times daily. 05/01/17   07/02/17  Elizabeth, PA-C  predniSONE (DELTASONE) 20 MG tablet Take 2 tablets (40 mg total) by mouth daily. Patient not taking: Reported on 05/01/2017 03/06/15   Raeford Razor, MD    Family History Family History  Problem Relation Age of Onset  . Hypertension Father   . Diabetes Father   . COPD Father   . Hypertension Mother   . Scoliosis Mother   . Diabetes Paternal Grandmother   . Scoliosis Sister     Social History Social History   Tobacco Use  . Smoking status: Current Some Day Smoker    Packs/day: 0.25  . Smokeless tobacco: Never Used  Substance Use Topics  . Alcohol use: No  . Drug use: Yes    Types: Marijuana    Comment: last used 2 weeks ago     Allergies   Tomato   Review of Systems Review of Systems   Physical Exam Triage Vital Signs ED Triage Vitals [04/05/20 1028]  Enc Vitals Group     BP      Pulse      Resp      Temp      Temp src      SpO2      Weight      Height      Head  Circumference      Peak Flow      Pain Score 10     Pain Loc      Pain Edu?      Excl. in GC?    No data found.  Updated Vital Signs BP 127/81 (BP Location: Right Arm)   Pulse 85   Temp 98.3 F (36.8 C) (Oral)   Resp 19   SpO2 99%   Visual Acuity Right Eye Distance:   Left Eye Distance:   Bilateral Distance:    Right Eye Near:   Left Eye Near:    Bilateral Near:     Physical Exam Vitals and nursing note reviewed.  Constitutional:      General: She is not in acute distress.    Appearance: She is well-developed. She is ill-appearing. She is not toxic-appearing.  HENT:     Head: Normocephalic and atraumatic.     Mouth/Throat:     Mouth: Mucous membranes are moist.     Pharynx: Uvula midline. Pharyngeal swelling (Right-sided soft palate swelling), posterior oropharyngeal erythema and uvula swelling present.     Tonsils: Tonsillar abscess present. 3+ on the right.     Comments: No stridorous respirations Eyes:     Conjunctiva/sclera: Conjunctivae normal.  Neck:     Comments: Right sided tenderness in the cervical region. Cardiovascular:     Rate and Rhythm: Normal rate and regular rhythm.     Heart sounds: No murmur.  Pulmonary:     Effort: Pulmonary effort is normal. No respiratory distress.  Skin:    General: Skin is warm and dry.  Neurological:     Mental Status: She is alert.      UC Treatments / Results  Labs (all labs ordered are listed, but only abnormal results are displayed) Labs Reviewed  CBG MONITORING, ED    EKG   Radiology No results found.  Procedures Procedures (including critical care time)  Medications Ordered in UC Medications - No data to display  Initial Impression / Assessment and Plan / UC Course  I have reviewed the triage vital signs and the nursing notes.  Pertinent labs & imaging results that were available during my care  of the patient were reviewed by me and considered in my medical decision making (see chart for  details).     #Peritonsillar abscess Patient is 34 year old presenting with peritonsillar abscess.  She controlling her airway and saliva currently.  However given large amount of swelling she needs drainage today.  Will refer to [facility] emergency department as she has not established with the ENT practice in the area and the on-call ENT does not have office hours.  Patient agrees to report to [facility] emergency department for further evaluation.  She is stable for self transport. Final Clinical Impressions(s) / UC Diagnoses   Final diagnoses:  Peritonsillar abscess     Discharge Instructions     We believe you have an abscess around your tonsil that needs to be further evaluated in the Emergency Department today, it is very important you go there following discharge.    ED Prescriptions    None     PDMP not reviewed this encounter.   Purnell Shoemaker, PA-C 04/05/20 1106

## 2020-04-05 NOTE — ED Provider Notes (Signed)
[facility] EMERGENCY DEPARTMENT Provider Note   CSN: [id number] Arrival date & time: 04/05/20  1107     History Chief Complaint  Patient presents with  . Abscess  . Sore Throat    Claire Riley is a 34 y.o. female.  Patient presenting with 7 days of throat pain that got worse over the last few days.  Patient presented to urgent care and they had concern for peritonsillar abscess on the right.  She was triaged here and received a CT scan showing a peritonsillar abscess on the right.  Patient denies fevers, cough congestion.  Patient states she has not received her cover vaccines yet.  Denies upper respiratory symptoms.  The history is provided by the patient and medical records.  Illness Location:  Throat Quality:  Sore Severity:  Moderate Onset quality:  Gradual Timing:  Constant Progression:  Worsening Chronicity:  New Context:  No specific etiology, patient has worsening sore throat for the last week Relieved by:  Mild improvement with BC powders Worsened by:  Time Ineffective treatments:  Nothing Associated symptoms: sore throat   Associated symptoms: no abdominal pain, no chest pain, no congestion, no cough, no fever, no headaches, no loss of consciousness, no nausea, no shortness of breath and no vomiting        Past Medical History:  Diagnosis Date  . Anemia   . Bronchial asthma    last attack 72yrs ago  . Depression   . Gonorrhea   . Headache(784.0)   . Pneumonia   . Trichomonas   . UTI (lower urinary tract infection)     There are no problems to display for this patient.   Past Surgical History:  Procedure Laterality Date  . WISDOM TOOTH EXTRACTION     2007     OB History    Gravida  4   Para  2   Term  2   Preterm      AB  2   Living  2     SAB  2   TAB      Ectopic      Multiple      Live Births  2           Family History  Problem Relation Age of Onset  . Hypertension Father   . Diabetes Father     . COPD Father   . Hypertension Mother   . Scoliosis Mother   . Diabetes Paternal Grandmother   . Scoliosis Sister     Social History   Tobacco Use  . Smoking status: Current Some Day Smoker    Packs/day: 0.25  . Smokeless tobacco: Never Used  Substance Use Topics  . Alcohol use: No  . Drug use: Yes    Types: Marijuana    Comment: last used 2 weeks ago    Home Medications Prior to Admission medications   Medication Sig Start Date End Date Taking? Authorizing Provider  acetaminophen (TYLENOL) 325 MG tablet Take 650 mg by mouth every 6 (six) hours as needed for mild pain.    [provider]  cephALEXin (KEFLEX) 500 MG capsule Take 1 capsule (500 mg total) by mouth 2 (two) times daily. 05/01/17   Barrett Henle, PA-C  clindamycin (CLEOCIN) 300 MG capsule Take 1 capsule (300 mg total) by mouth 3 (three) times daily for 7 days. 04/05/20 04/12/20  Janeece Fitting, MD  etonogestrel (NEXPLANON) 68 MG IMPL implant 1 each by Subdermal route once.  [provider]  guaiFENesin-codeine 100-10 MG/5ML syrup Take 5 mLs by mouth 3 (three) times daily as needed for cough. Patient not taking: Reported on 05/01/2017 03/05/15   Comer Locket, PA-C  HYDROcodone-acetaminophen (NORCO/VICODIN) 5-325 MG tablet Take 1 tablet by mouth every 4 (four) hours as needed for up to 10 doses for moderate pain. 04/05/20   Delma Post, MD  ibuprofen (ADVIL,MOTRIN) 600 MG tablet Take 1 tablet (600 mg total) by mouth every 6 (six) hours. 06/04/13   Kassie Mends, MD  metroNIDAZOLE (FLAGYL) 500 MG tablet Take 1 tablet (500 mg total) by mouth 2 (two) times daily. One po bid x 7 days Patient not taking: Reported on 03/05/2015 10/20/14   Malvin Johns, MD  Multiple Vitamins-Minerals (MULTIVITAMIN WITH MINERALS) tablet Take 1 tablet by mouth daily.    [provider]  naproxen (NAPROSYN) 500 MG tablet Take 1 tablet (500 mg total) by mouth 2 (two) times daily. 05/01/17   Nona Dell,  PA-C  ondansetron (ZOFRAN ODT) 8 MG disintegrating tablet 8mg  ODT q4 hours prn nausea Patient not taking: Reported on 05/01/2017 10/20/14   Malvin Johns, MD  phenazopyridine (PYRIDIUM) 200 MG tablet Take 1 tablet (200 mg total) by mouth 3 (three) times daily. 05/01/17   Nona Dell, PA-C  predniSONE (DELTASONE) 20 MG tablet Take 2 tablets (40 mg total) by mouth daily. Patient not taking: Reported on 05/01/2017 03/06/15   Virgel Manifold, MD    Allergies    Tomato  Review of Systems   Review of Systems  Constitutional: Negative for fever.  HENT: Positive for sore throat. Negative for congestion.   Respiratory: Negative for cough and shortness of breath.   Cardiovascular: Negative for chest pain.  Gastrointestinal: Negative for abdominal pain, nausea and vomiting.  Neurological: Negative for loss of consciousness and headaches.  All other systems reviewed and are negative.   Physical Exam Updated Vital Signs BP 121/85   Pulse 90   Temp 98.7 F (37.1 C) (Oral)   Resp 20   Ht 6\' 2"  (1.88 m)   Wt 99.3 kg   LMP 03/16/2020   SpO2 98%   BMI 28.12 kg/m   Physical Exam Vitals and nursing note reviewed.  Constitutional:      General: She is not in acute distress.    Appearance: She is well-developed.  HENT:     Head: Normocephalic and atraumatic.     Mouth/Throat:     Mouth: Mucous membranes are moist.     Pharynx: Pharyngeal swelling and posterior oropharyngeal erythema present.     Tonsils: Tonsillar abscess present.     Comments: Patient has erythema and swelling on the palate above the right tonsil Eyes:     Conjunctiva/sclera: Conjunctivae normal.  Cardiovascular:     Rate and Rhythm: Normal rate and regular rhythm.     Heart sounds: No murmur.  Pulmonary:     Effort: Pulmonary effort is normal. No respiratory distress.     Breath sounds: Normal breath sounds.  Abdominal:     Palpations: Abdomen is soft.     Tenderness: There is no abdominal tenderness.   Musculoskeletal:     Cervical back: Neck supple.  Skin:    General: Skin is warm and dry.  Neurological:     General: No focal deficit present.     Mental Status: She is alert and oriented to person, place, and time.  Psychiatric:        Mood and Affect: Mood normal.  Behavior: Behavior normal.     ED Results / Procedures / Treatments   Labs (all labs ordered are listed, but only abnormal results are displayed) Labs Reviewed  SARS CORONAVIRUS 2 BY RT PCR (HOSPITAL ORDER, PERFORMED IN [facility])  BASIC METABOLIC PANEL      EKG None  Radiology CT Soft Tissue Neck W Contrast  Result Date: 04/05/2020 CLINICAL DATA:  Pain, possible peritonsillar abscess EXAM: CT NECK WITH CONTRAST TECHNIQUE: Multidetector CT imaging of the neck was performed using the standard protocol following the bolus administration of intravenous contrast. CONTRAST:  40mL OMNIPAQUE IOHEXOL 300 MG/ML  SOLN COMPARISON:  None. FINDINGS: Pharynx and larynx: Enlarged and edematous right palatine tonsil. There is an area of more discrete low-attenuation measuring approximately 0.9 x 1.5 x 0.9 cm. There is right parapharyngeal fat infiltration. Airway is patent. Salivary glands: Unremarkable. Thyroid: Normal. Lymph nodes: Reactive suprahyoid right cervical lymph nodes. Largest node measures 2 cm. Vascular: Major neck vessels are patent. Limited intracranial: No abnormal enhancement. Visualized orbits: Minimally imaged. Mastoids and visualized paranasal sinuses: Mild paranasal sinus mucosal thickening. Visualized mastoids are clear. Skeleton: Partially imaged probably chronic left nasal bone fracture. Upper chest: Included upper lungs are clear. Other: None. IMPRESSION: Findings consistent with tonsillitis and right peritonsillar abscess. Patent airway. Electronically Signed   By: Guadlupe Spanish M.D.   On: 04/05/2020 13:31    Procedures Procedures (including critical care time)  Medications Ordered in  ED Medications  iohexol (OMNIPAQUE) 300 MG/ML solution 75 mL (75 mLs Intravenous Contrast Given 04/05/20 1304)  sodium chloride 0.9 % bolus 1,000 mL (0 mLs Intravenous Stopped 04/05/20 1825)  clindamycin (CLEOCIN) IVPB 600 mg (0 mg Intravenous Stopped 04/05/20 1717)  dexamethasone (DECADRON) injection 10 mg (10 mg Intravenous Given 04/05/20 1639)  morphine 4 MG/ML injection 4 mg (4 mg Intravenous Given 04/05/20 1720)    ED Course  I have reviewed the triage vital signs and the nursing notes.  Pertinent labs & imaging results that were available during my care of the patient were reviewed by me and considered in my medical decision making (see chart for details).    MDM Rules/Calculators/A&P                      Differential diagnosis: Peritonsillar abscess, COVID-19, pharyngitis  ED physician interpretation of imaging: CT with right PTA ED physician interpretation of labs: BMP without critical values  MDM: Patient is a 34 year old female presenting to the ED with left PTA requiring ENT consultation, drainage, steroids, IV antibiotics appropriate discharge home on oral antibiotics after intervention by ENT.  Patient's vital signs are stable, patient afebrile.  Patient's physical exam is markable for the aforementioned PTA.  ENT consulted, PT drained at bedside.  Patient reevaluated feeling much better.  Patient discharged with oral antibiotics, narcotic pain medication and follow-up plan with ENT.  Diagnosis, treatment and plan of care was discussed and agreed upon with patient.  Patient comfortable with discharge at this time.  Key discharge instructions: You were transferred to the ED with a right peritonsillar abscess that was identified on imaging, ENT was consulted in the ED and drained at bedside.  He was given steroids and antibiotics while in the ED.  A prescription was written for p.o. antibiotics as well as narcotic pain medication.  Also recommend taking naproxen or your preferred  NSAID for pain control scheduled for the next several days.  Please follow-up with [facility] ear nose and throat in 1 week  if you do not have your own ear nose and throat doctor.   Final Clinical Impression(s) / ED Diagnoses Final diagnoses:  Peritonsillar abscess    Rx / DC Orders ED Discharge Orders         Ordered    clindamycin (CLEOCIN) 300 MG capsule  3 times daily     04/05/20 1802    HYDROcodone-acetaminophen (NORCO/VICODIN) 5-325 MG tablet  Every 4 hours PRN     04/05/20 1802           Janeece Fitting, MD 04/06/20 1022    Alvira Monday, MD 04/08/20 1608

## 2020-04-05 NOTE — ED Notes (Signed)
Patient is being discharged from the Urgent Care and sent to the Emergency Department via POV . Per Omer Jack, PA, patient is in need of higher level of care due to peritonsillar abscess. Patient is aware and verbalizes understanding of plan of care.  Vitals:   04/05/20 1032  BP: 127/81  Pulse: 85  Resp: 19  Temp: 98.3 F (36.8 C)  SpO2: 99%

## 2020-04-05 NOTE — Discharge Instructions (Addendum)
We believe you have an abscess around your tonsil that needs to be further evaluated in the Emergency Department today, it is very important you go there following discharge.

## 2020-04-05 NOTE — ED Triage Notes (Signed)
Patient reports sore throat for about 1 week, but worsening rapidly over the last two to three days. Denies any other symptoms. Upon examination of throat, redness, swelling, and white spots were present.

## 2020-04-05 NOTE — ED Provider Notes (Signed)
MSE was initiated and I personally evaluated the patient and placed orders (if any) at  11:44 AM on April 05, 2020.  34 year old female presents to the ED from urgent care due to possible peritonsillar abscess.  Patient notes she has been having a sore throat for the past week that has progressively worsened associated with changes to phonation over the past few days.  Admits to pain with swallowing, but denies difficulty swallowing saliva.  Denies shortness of breath.  She has tried [facility] powder with no relief.  Denies fever and chills.  Stable vitals.  Patient is afebrile. Patient in no acute distress. On exam, 3+ tonsillar hypertrophy on the right with mild uvula deviations. No exudates. Tolerating oral secretions without difficulty. Lungs clear to auscultation bilaterally with no stridor.  Will obtain routine labs and CT neck to assess for abscess and rule out deep space infection.   The patient appears stable so that the remainder of the MSE may be completed by another provider.   Mannie Stabile, PA-C 04/05/20 1148    Derwood Kaplan, MD 04/05/20 1303

## 2020-04-13 DIAGNOSIS — J36 Peritonsillar abscess: Secondary | ICD-10-CM | POA: Insufficient documentation

## 2020-05-11 DIAGNOSIS — S63254A Unspecified dislocation of right ring finger, initial encounter: Secondary | ICD-10-CM | POA: Diagnosis not present

## 2020-05-12 DIAGNOSIS — S63279A Dislocation of unspecified interphalangeal joint of unspecified finger, initial encounter: Secondary | ICD-10-CM | POA: Diagnosis not present

## 2020-05-13 DIAGNOSIS — S63284A Dislocation of proximal interphalangeal joint of right ring finger, initial encounter: Secondary | ICD-10-CM | POA: Diagnosis not present

## 2020-05-19 DIAGNOSIS — S63274D Dislocation of unspecified interphalangeal joint of right ring finger, subsequent encounter: Secondary | ICD-10-CM | POA: Diagnosis not present

## 2020-05-27 DIAGNOSIS — E669 Obesity, unspecified: Secondary | ICD-10-CM | POA: Diagnosis not present

## 2020-05-27 DIAGNOSIS — Z0000 Encounter for general adult medical examination without abnormal findings: Secondary | ICD-10-CM | POA: Diagnosis not present

## 2020-05-27 DIAGNOSIS — Z131 Encounter for screening for diabetes mellitus: Secondary | ICD-10-CM | POA: Diagnosis not present

## 2020-05-27 DIAGNOSIS — Z13220 Encounter for screening for lipoid disorders: Secondary | ICD-10-CM | POA: Diagnosis not present

## 2020-05-27 DIAGNOSIS — J4520 Mild intermittent asthma, uncomplicated: Secondary | ICD-10-CM | POA: Diagnosis not present

## 2020-05-30 DIAGNOSIS — E669 Obesity, unspecified: Secondary | ICD-10-CM | POA: Diagnosis not present

## 2020-05-30 DIAGNOSIS — N925 Other specified irregular menstruation: Secondary | ICD-10-CM | POA: Diagnosis not present

## 2020-05-30 DIAGNOSIS — Z6829 Body mass index (BMI) 29.0-29.9, adult: Secondary | ICD-10-CM | POA: Diagnosis not present

## 2020-05-30 DIAGNOSIS — J4520 Mild intermittent asthma, uncomplicated: Secondary | ICD-10-CM | POA: Diagnosis not present

## 2020-05-30 DIAGNOSIS — Z131 Encounter for screening for diabetes mellitus: Secondary | ICD-10-CM | POA: Diagnosis not present

## 2020-06-23 DIAGNOSIS — R600 Localized edema: Secondary | ICD-10-CM | POA: Diagnosis not present

## 2020-06-23 DIAGNOSIS — S63279A Dislocation of unspecified interphalangeal joint of unspecified finger, initial encounter: Secondary | ICD-10-CM | POA: Diagnosis not present

## 2020-06-23 DIAGNOSIS — M25641 Stiffness of right hand, not elsewhere classified: Secondary | ICD-10-CM | POA: Diagnosis not present

## 2020-07-15 DIAGNOSIS — F29 Unspecified psychosis not due to a substance or known physiological condition: Secondary | ICD-10-CM | POA: Diagnosis not present

## 2020-08-05 DIAGNOSIS — F29 Unspecified psychosis not due to a substance or known physiological condition: Secondary | ICD-10-CM | POA: Diagnosis not present

## 2020-08-12 DIAGNOSIS — E559 Vitamin D deficiency, unspecified: Secondary | ICD-10-CM | POA: Diagnosis not present

## 2020-08-12 DIAGNOSIS — N925 Other specified irregular menstruation: Secondary | ICD-10-CM | POA: Diagnosis not present

## 2020-08-12 DIAGNOSIS — J4520 Mild intermittent asthma, uncomplicated: Secondary | ICD-10-CM | POA: Diagnosis not present

## 2020-08-17 ENCOUNTER — Ambulatory Visit (HOSPITAL_COMMUNITY)
Admission: EM | Admit: 2020-08-17 | Discharge: 2020-08-17 | Disposition: A | Discharge status: Home / Self Care | Payer: Medicaid Other | Attending: Nurse Practitioner | Admitting: Nurse Practitioner

## 2020-08-17 ENCOUNTER — Other Ambulatory Visit: Payer: Self-pay

## 2020-08-17 DIAGNOSIS — R45851 Suicidal ideations: Secondary | ICD-10-CM | POA: Diagnosis not present

## 2020-08-17 DIAGNOSIS — F17210 Nicotine dependence, cigarettes, uncomplicated: Secondary | ICD-10-CM | POA: Insufficient documentation

## 2020-08-17 DIAGNOSIS — Z113 Encounter for screening for infections with a predominantly sexual mode of transmission: Secondary | ICD-10-CM | POA: Diagnosis not present

## 2020-08-17 DIAGNOSIS — F319 Bipolar disorder, unspecified: Secondary | ICD-10-CM | POA: Diagnosis not present

## 2020-08-17 DIAGNOSIS — Z3202 Encounter for pregnancy test, result negative: Secondary | ICD-10-CM

## 2020-08-17 DIAGNOSIS — F4310 Post-traumatic stress disorder, unspecified: Secondary | ICD-10-CM | POA: Insufficient documentation

## 2020-08-17 DIAGNOSIS — Z79899 Other long term (current) drug therapy: Secondary | ICD-10-CM | POA: Insufficient documentation

## 2020-08-17 DIAGNOSIS — F332 Major depressive disorder, recurrent severe without psychotic features: Secondary | ICD-10-CM | POA: Diagnosis not present

## 2020-08-17 DIAGNOSIS — F4481 Dissociative identity disorder: Secondary | ICD-10-CM | POA: Diagnosis not present

## 2020-08-17 DIAGNOSIS — Z20822 Contact with and (suspected) exposure to covid-19: Secondary | ICD-10-CM | POA: Insufficient documentation

## 2020-08-17 LAB — LIPID PANEL
Cholesterol: 154 mg/dL (ref 0–200)
HDL: 24 mg/dL — ABNORMAL LOW (ref >40)
LDL Cholesterol: 115 mg/dL — ABNORMAL HIGH (ref 0–99)
Total CHOL/HDL Ratio: 6.4 RATIO
Triglycerides: 77 mg/dL (ref <150)
VLDL: 15 mg/dL (ref 0–40)

## 2020-08-17 LAB — TSH: TSH: 0.699 u[IU]/mL (ref 0.350–4.500)

## 2020-08-17 LAB — POCT URINE DRUG SCREEN - MANUAL ENTRY (I-SCREEN)
POC Amphetamine UR: NOT DETECTED
POC Buprenorphine (BUP): NOT DETECTED
POC Cocaine UR: POSITIVE — AB
POC Marijuana UR: POSITIVE — AB
POC Methadone UR: NOT DETECTED
POC Methamphetamine UR: NOT DETECTED
POC Morphine: NOT DETECTED
POC Oxazepam (BZO): NOT DETECTED
POC Oxycodone UR: NOT DETECTED
POC Secobarbital (BAR): NOT DETECTED

## 2020-08-17 LAB — CBC WITH DIFFERENTIAL/PLATELET
Abs Immature Granulocytes: 0.03 10*3/uL (ref 0.00–0.07)
Basophils Absolute: 0 10*3/uL (ref 0.0–0.1)
Basophils Relative: 0 %
Eosinophils Absolute: 0.2 10*3/uL (ref 0.0–0.5)
Eosinophils Relative: 3 %
HCT: 33.4 % — ABNORMAL LOW (ref 36.0–46.0)
Hemoglobin: 10 g/dL — ABNORMAL LOW (ref 12.0–15.0)
Immature Granulocytes: 1 %
Lymphocytes Relative: 41 %
Lymphs Abs: 2.5 10*3/uL (ref 0.7–4.0)
MCH: 22.5 pg — ABNORMAL LOW (ref 26.0–34.0)
MCHC: 29.9 g/dL — ABNORMAL LOW (ref 30.0–36.0)
MCV: 75.2 fL — ABNORMAL LOW (ref 80.0–100.0)
Monocytes Absolute: 0.4 10*3/uL (ref 0.1–1.0)
Monocytes Relative: 7 %
Neutro Abs: 2.9 10*3/uL (ref 1.7–7.7)
Neutrophils Relative %: 48 %
Platelets: 307 10*3/uL (ref 150–400)
RBC: 4.44 MIL/uL (ref 3.87–5.11)
RDW: 15.4 % (ref 11.5–15.5)
WBC: 6 10*3/uL (ref 4.0–10.5)
nRBC: 0 % (ref 0.0–0.2)

## 2020-08-17 LAB — RESPIRATORY PANEL BY RT PCR (FLU A&B, COVID)
Influenza A by PCR: NEGATIVE
Influenza B by PCR: NEGATIVE
SARS Coronavirus 2 by RT PCR: NEGATIVE

## 2020-08-17 LAB — COMPREHENSIVE METABOLIC PANEL
ALT: 14 U/L (ref 0–44)
AST: 20 U/L (ref 15–41)
Albumin: 4.3 g/dL (ref 3.5–5.0)
Alkaline Phosphatase: 57 U/L (ref 38–126)
Anion gap: 10 (ref 5–15)
BUN: 11 mg/dL (ref 6–20)
CO2: 25 mmol/L (ref 22–32)
Calcium: 9.2 mg/dL (ref 8.9–10.3)
Chloride: 105 mmol/L (ref 98–111)
Creatinine, Ser: 0.81 mg/dL (ref 0.44–1.00)
GFR, Estimated: >60 mL/min (ref >60)
Glucose, Bld: 108 mg/dL — ABNORMAL HIGH (ref 70–99)
Potassium: 3.3 mmol/L — ABNORMAL LOW (ref 3.5–5.1)
Sodium: 140 mmol/L (ref 135–145)
Total Bilirubin: 0.7 mg/dL (ref 0.3–1.2)
Total Protein: 6.9 g/dL (ref 6.5–8.1)

## 2020-08-17 LAB — POCT PREGNANCY, URINE: Preg Test, Ur: NEGATIVE

## 2020-08-17 LAB — POC SARS CORONAVIRUS 2 AG -  ED: SARS Coronavirus 2 Ag: NEGATIVE

## 2020-08-17 LAB — POC SARS CORONAVIRUS 2 AG: SARS Coronavirus 2 Ag: NEGATIVE

## 2020-08-17 MED ORDER — OLANZAPINE 2.5 MG PO TABS: 2.5000 mg | ORAL_TABLET | Freq: Once | ORAL | Status: DC

## 2020-08-17 MED ORDER — FLUOXETINE HCL 20 MG PO CAPS
20.0000 mg | ORAL_CAPSULE | Freq: Every day | ORAL | Status: DC
  Administered 2020-08-17: 20 mg via ORAL
  Filled 2020-08-17: qty 1

## 2020-08-17 MED ORDER — FLUOXETINE HCL 20 MG PO CAPS: 20.0000 mg | ORAL_CAPSULE | Freq: Every day | ORAL | 0 refills | Status: DC

## 2020-08-17 MED ORDER — TRAZODONE HCL 50 MG PO TABS: 50.0000 mg | ORAL_TABLET | Freq: Every evening | ORAL | Status: DC | PRN

## 2020-08-17 MED ORDER — ACETAMINOPHEN 325 MG PO TABS: 650.0000 mg | ORAL_TABLET | Freq: Four times a day (QID) | ORAL | Status: DC | PRN

## 2020-08-17 MED ORDER — OLANZAPINE 5 MG PO TABS: 5.0000 mg | ORAL_TABLET | Freq: Every day | ORAL | 0 refills | Status: DC

## 2020-08-17 MED ORDER — TRAZODONE HCL 50 MG PO TABS: 50.0000 mg | ORAL_TABLET | Freq: Every evening | ORAL | 0 refills | Status: DC | PRN

## 2020-08-17 MED ORDER — HYDROXYZINE HCL 25 MG PO TABS: 25.0000 mg | ORAL_TABLET | Freq: Three times a day (TID) | ORAL | 0 refills | Status: DC | PRN

## 2020-08-17 MED ORDER — HYDROXYZINE HCL 25 MG PO TABS: 25.0000 mg | ORAL_TABLET | Freq: Three times a day (TID) | ORAL | Status: DC | PRN

## 2020-08-17 MED ORDER — ALUM & MAG HYDROXIDE-SIMETH 200-200-20 MG/5ML PO SUSP: 30.0000 mL | ORAL | Status: DC | PRN

## 2020-08-17 MED ORDER — OLANZAPINE 5 MG PO TABS: 5.0000 mg | ORAL_TABLET | Freq: Every day | ORAL | Status: DC

## 2020-08-17 MED ORDER — MAGNESIUM HYDROXIDE 400 MG/5ML PO SUSP: 30.0000 mL | Freq: Every day | ORAL | Status: DC | PRN

## 2020-08-17 MED ORDER — OLANZAPINE 5 MG PO TABS
5.0000 mg | ORAL_TABLET | Freq: Two times a day (BID) | ORAL | Status: DC
  Administered 2020-08-17: 5 mg via ORAL
  Filled 2020-08-17: qty 1

## 2020-08-17 NOTE — ED Provider Notes (Signed)
FBC/OBS ASAP Discharge Summary  Date and Time: 08/17/2020 2:13 PM  Name: Claire Riley  MRN:  [id number]   Discharge Diagnoses:  Final diagnoses:  Bipolar depression (HCC)  Suicidal ideation    Subjective: "I feel better, but the medication I took this morning made me feel drowsy."  Stay Summary: The patient was seen and examined face to face. She is alert and oriented x 4. She reports feeling "woozy during the assessment but she was able to participate and answered questions appropriately. She reports always feeling suicidal and stated that they don't go away. She denies active suicidal thoughts. She verbally contracts for safety. She denies homicidal ideations. She denies auditory and visual hallucinations. She does not appear to be responding to internal or external stimuli. She reports that she lives with her husband and identifies him as her support person. She is requesting outpatient psychiatry and therapy. She denies needing inpatient treatment and feels safe to return home today. 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   We discussed outpatient psychiatry and therapy treatment here at the [facility]. The patient is agreeable to this plan. She was provided with the walk in hours for outpatient services to establish treatment. She gave verbal consent for this provider to speak with her husband [place] at [phone]. This provider spoke with [facility] regarding the patient's discharge plan and safety precautions. Fredric Mare denies any concerns with the patient returning home today and agrees to the stated plan of care which consists of the patient taking medications and following up with psychiatry and therapy at the [facility]. He was advised to lock up any weapons or pill bottles in the home. He reported that there are no weapons or pills in the home. He expressed that he would like for his wife to come home today and that he would be able to  pick her up from the [facility].   The patient does not meet criteria for inpatient treatment. She is psychiatrically cleared. Labs reviewed. Vital signs reviewed. Medications reviewed. Zyprexa modified to 5 mg PO at bedtime. Prescriptions were sent electronically to the patient's pharmacy on file.  Total Time spent with patient: 30 minutes  Past Psychiatric History: Bipolar Depression, Dissociative Identity Disorder, and PTSD. Past Medical History:  Past Medical History:  Diagnosis Date  . Anemia   . Bronchial asthma    last attack 22yrs ago  . Depression   . Gonorrhea   . Headache(784.0)   . Pneumonia   . Trichomonas   . UTI (lower urinary tract infection)     Past Surgical History:  Procedure Laterality Date  . WISDOM TOOTH EXTRACTION     2007   Family History:  Family History  Problem Relation Age of Onset  . Hypertension Father   . Diabetes Father   . COPD Father   . Hypertension Mother   . Scoliosis Mother   . Diabetes Paternal Grandmother   . Scoliosis Sister    Family Psychiatric History: Unknown Social History:  Social History   Substance and Sexual Activity  Alcohol Use No     Social History   Substance and Sexual Activity  Drug Use Yes  . Types: Marijuana   Comment: last used 2 weeks ago    Social History   Socioeconomic History  . Marital status: Married    Spouse name: Not on file  . Number of children: Not on file  . Years of education:  Not on file  . Highest education level: Not on file  Occupational History    Employer: [employer]    Comment: [employer]  Tobacco Use  . Smoking status: Current Some Day Smoker    Packs/day: 0.25  . Smokeless tobacco: Never Used  Vaping Use  . Vaping Use: Never used  Substance and Sexual Activity  . Alcohol use: No  . Drug use: Yes    Types: Marijuana    Comment: last used 2 weeks ago  . Sexual activity: Not Currently    Comment: Last intercourse 2 days ago  Other Topics Concern  . Not on  file  Social History Narrative  . Not on file   Social Determinants of Health   Financial Resource Strain:   . Difficulty of Paying Living Expenses: Not on file  Food Insecurity:   . Worried About Programme researcher, broadcasting/film/video in the Last Year: Not on file  . Ran Out of Food in the Last Year: Not on file  Transportation Needs:   . Lack of Transportation (Medical): Not on file  . Lack of Transportation (Non-Medical): Not on file  Physical Activity:   . Days of Exercise per Week: Not on file  . Minutes of Exercise per Session: Not on file  Stress:   . Feeling of Stress : Not on file  Social Connections:   . Frequency of Communication with Friends and Family: Not on file  . Frequency of Social Gatherings with Friends and Family: Not on file  . Attends Religious Services: Not on file  . Active Member of Clubs or Organizations: Not on file  . Attends Banker Meetings: Not on file  . Marital Status: Not on file   SDOH:  SDOH Screenings   Alcohol Screen:   . Last Alcohol Screening Score (AUDIT): Not on file  Depression (PHQ2-9):   . PHQ-2 Score: Not on file  Financial Resource Strain:   . Difficulty of Paying Living Expenses: Not on file  Food Insecurity:   . Worried About Programme researcher, broadcasting/film/video in the Last Year: Not on file  . Ran Out of Food in the Last Year: Not on file  Housing:   . Last Housing Risk Score: Not on file  Physical Activity:   . Days of Exercise per Week: Not on file  . Minutes of Exercise per Session: Not on file  Social Connections:   . Frequency of Communication with Friends and Family: Not on file  . Frequency of Social Gatherings with Friends and Family: Not on file  . Attends Religious Services: Not on file  . Active Member of Clubs or Organizations: Not on file  . Attends Banker Meetings: Not on file  . Marital Status: Not on file  Stress:   . Feeling of Stress : Not on file  Tobacco Use: High Risk  . Smoking Tobacco Use: Current  Some Day Smoker  . Smokeless Tobacco Use: Never Used  Transportation Needs:   . Freight forwarder (Medical): Not on file  . Lack of Transportation (Non-Medical): Not on file    Has this patient used any form of tobacco in the last 30 days? (Cigarettes, Smokeless Tobacco, Cigars, and/or Pipes) A prescription for an FDA-approved tobacco cessation medication was offered at discharge and the patient refused  Current Medications:  Current Facility-Administered Medications  Medication Dose Route Frequency Provider Last Rate Last Admin  . acetaminophen (TYLENOL) tablet 650 mg  650 mg Oral Q6H  PRN Jackelyn PolingBerry, Jason A, NP      . alum & mag hydroxide-simeth (MAALOX/MYLANTA) 200-200-20 MG/5ML suspension 30 mL  30 mL Oral Q4H PRN Nira ConnBerry, Jason A, NP      . FLUoxetine (PROZAC) capsule 20 mg  20 mg Oral Daily Nira ConnBerry, Jason A, NP   20 mg at 08/17/20 [id number]  . hydrOXYzine (ATARAX/VISTARIL) tablet 25 mg  25 mg Oral TID PRN Jackelyn PolingBerry, Jason A, NP      . magnesium hydroxide (MILK OF MAGNESIA) suspension 30 mL  30 mL Oral Daily PRN Nira ConnBerry, Jason A, NP      . OLANZapine (ZYPREXA) tablet 5 mg  5 mg Oral BID Tavaughn Silguero, Gerlene Burdockravis B, FNP   5 mg at 08/17/20 [id number]  . traZODone (DESYREL) tablet 50 mg  50 mg Oral QHS PRN Jackelyn PolingBerry, Jason A, NP       Current Outpatient Medications  Medication Sig Dispense Refill  . etonogestrel (NEXPLANON) 68 MG IMPL implant 1 each by Subdermal route once.    Melene Muller. [START ON 08/18/2020] FLUoxetine (PROZAC) 20 MG capsule Take 1 capsule (20 mg total) by mouth daily. 30 capsule 0  . hydrOXYzine (ATARAX/VISTARIL) 25 MG tablet Take 1 tablet (25 mg total) by mouth 3 (three) times daily as needed for anxiety. 30 tablet 0  . Multiple Vitamins-Minerals (MULTIVITAMIN WITH MINERALS) tablet Take 1 tablet by mouth daily.    Marland Kitchen. OLANZapine (ZYPREXA) 5 MG tablet Take 1 tablet (5 mg total) by mouth at bedtime. 30 tablet 0  . traZODone (DESYREL) 50 MG tablet Take 1 tablet (50 mg total) by mouth at bedtime as needed for sleep. 30  tablet 0    PTA Medications: (Not in a hospital admission)   Musculoskeletal  Strength & Muscle Tone: within normal limits Gait & Station: normal Patient leans: N/A  Psychiatric Specialty Exam  Presentation  General Appearance: Appropriate for Environment  Eye Contact:Minimal  Speech:Clear and Coherent  Speech Volume:Decreased  Handedness:Right   Mood and Affect  Mood:Depressed  Affect:Congruent   Thought Process  Thought Processes:Coherent  Descriptions of Associations:Intact  Orientation:Full (Time, Place and Person)  Thought Content:WDL  Hallucinations:Hallucinations: None Description of Auditory Hallucinations: states that she hears voices that tell her to kill herself and make negative comments about her  Ideas of Reference:None  Suicidal Thoughts:Suicidal Thoughts: Yes, Passive SI Active Intent and/or Plan: With Intent;With Plan;With Means to Carry Out  Homicidal Thoughts:Homicidal Thoughts: No   Sensorium  Memory:Immediate Fair;Remote Fair;Recent Fair  Judgment:Fair  Insight:Fair   Executive Functions  Concentration:Fair  Attention Span:Fair  Recall:Fair  Fund of Knowledge:Fair  Language:Fair   Psychomotor Activity  Psychomotor Activity:Psychomotor Activity: Normal   Assets  Assets:Communication Skills;Desire for Improvement;Housing;Intimacy;Leisure Time;Physical Health;Social Support   Sleep  Sleep:Sleep: Fair   Physical Exam  Physical Exam Vitals and nursing note reviewed.  Constitutional:      Appearance: She is well-developed.  HENT:     Head: Normocephalic.  Eyes:     Pupils: Pupils are equal, round, and reactive to light.  Cardiovascular:     Rate and Rhythm: Normal rate.  Pulmonary:     Effort: Pulmonary effort is normal.  Musculoskeletal:        General: Normal range of motion.  Neurological:     Mental Status: She is alert and oriented to person, place, and time.    Review of Systems  Constitutional:  Negative.   HENT: Negative.   Eyes: Negative.   Respiratory: Negative.   Cardiovascular: Negative.   Gastrointestinal: Negative.  Genitourinary: Negative.   Musculoskeletal: Negative.   Skin: Negative.   Neurological: Negative.   Endo/Heme/Allergies: Negative.   Psychiatric/Behavioral: Positive for depression, hallucinations and suicidal ideas.   Blood pressure 114/84, pulse 79, temperature 97.8 F (36.6 C), resp. rate 18, height 6\' 2"  (1.88 m), weight 203 lb (92.1 kg), SpO2 99 %. Body mass index is 26.06 kg/m.  Demographic Factors:  NA  Loss Factors: NA  Historical Factors: Prior suicide attempts and Impulsivity  Risk Reduction Factors:   Responsible for children under 19 years of age, Sense of responsibility to family, Employed and Positive social support  Continued Clinical Symptoms:  Previous Psychiatric Diagnoses and Treatments  Cognitive Features That Contribute To Risk:  None    Suicide Risk:  Minimal: No identifiable suicidal ideation.  Patients presenting with no risk factors but with morbid ruminations; may be classified as minimal risk based on the severity of the depressive symptoms  Plan Of Care/Follow-up recommendations:  Patient is instructed prior to discharge to: Take all medications as prescribed by his/her mental healthcare provider. Report any adverse effects and or reactions from the medicines to his/her outpatient provider promptly. Patient has been instructed & cautioned: To not engage in alcohol and or illegal drug use while on prescription medicines. In the event of worsening symptoms, patient is instructed to call the crisis hotline, 911 and or go to the nearest ED for appropriate evaluation and treatment of symptoms. To follow-up with his/her primary care provider for your other medical issues, concerns and or health care needs.    Disposition: Discharge to home. The patient will be picked up by her husband 15.  [place] Kolton Kienle,  FNP 08/17/2020, 2:13 PM

## 2020-08-17 NOTE — Discharge Instructions (Signed)
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
## 2020-08-17 NOTE — Progress Notes (Signed)
Received Claire Riley this AM asleep in her chair bed, she got up several times to talk with the staff. Later she received lher discharge order and was instructed to call her husband to transport her home.

## 2020-08-17 NOTE — Progress Notes (Signed)
Claire Riley was discharge at 1652 with her husband and personal belongings. She received her AVS and her questions were answered.

## 2020-08-18 LAB — HEMOGLOBIN A1C
Hgb A1c MFr Bld: 5.3 % (ref 4.8–5.6)
Mean Plasma Glucose: 105 mg/dL

## 2020-09-18 ENCOUNTER — Telehealth (HOSPITAL_COMMUNITY): Payer: Self-pay | Admitting: Registered Nurse

## 2020-09-18 NOTE — Telephone Encounter (Signed)
Did not call or place order for this patient.

## 2020-09-20 DIAGNOSIS — S63274S Dislocation of unspecified interphalangeal joint of right ring finger, sequela: Secondary | ICD-10-CM | POA: Diagnosis not present

## 2020-09-20 DIAGNOSIS — S63279A Dislocation of unspecified interphalangeal joint of unspecified finger, initial encounter: Secondary | ICD-10-CM | POA: Diagnosis not present

## 2020-09-20 DIAGNOSIS — M24641 Ankylosis, right hand: Secondary | ICD-10-CM | POA: Diagnosis not present

## 2020-10-11 ENCOUNTER — Other Ambulatory Visit: Payer: Self-pay

## 2020-10-11 ENCOUNTER — Encounter (HOSPITAL_COMMUNITY): Payer: Self-pay | Admitting: Physician Assistant

## 2020-10-11 ENCOUNTER — Telehealth (HOSPITAL_COMMUNITY): Payer: Self-pay | Admitting: *Deleted

## 2020-10-11 ENCOUNTER — Telehealth (INDEPENDENT_AMBULATORY_CARE_PROVIDER_SITE_OTHER): Payer: Medicaid Other | Admitting: Physician Assistant

## 2020-10-11 DIAGNOSIS — F5105 Insomnia due to other mental disorder: Secondary | ICD-10-CM | POA: Diagnosis not present

## 2020-10-11 DIAGNOSIS — F319 Bipolar disorder, unspecified: Secondary | ICD-10-CM

## 2020-10-11 DIAGNOSIS — F99 Mental disorder, not otherwise specified: Secondary | ICD-10-CM

## 2020-10-11 DIAGNOSIS — F411 Generalized anxiety disorder: Secondary | ICD-10-CM | POA: Diagnosis not present

## 2020-10-11 MED ORDER — TRAZODONE HCL 50 MG PO TABS: 50.0000 mg | ORAL_TABLET | Freq: Every evening | ORAL | 2 refills | Status: DC | PRN

## 2020-10-11 MED ORDER — OLANZAPINE 5 MG PO TABS: 5.0000 mg | ORAL_TABLET | Freq: Every day | ORAL | 2 refills | Status: DC

## 2020-10-11 MED ORDER — FLUOXETINE HCL 20 MG PO CAPS: 20.0000 mg | ORAL_CAPSULE | Freq: Every day | ORAL | 2 refills | Status: DC

## 2020-10-11 MED ORDER — HYDROXYZINE HCL 25 MG PO TABS: 25.0000 mg | ORAL_TABLET | Freq: Three times a day (TID) | ORAL | 1 refills | Status: DC | PRN
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
## 2020-10-11 NOTE — Telephone Encounter (Signed)
Prior authorization obtained for patients Olanzapine 5 mg number is [id number] and its good till 10/11/2021. Pharmacy notified.

## 2020-11-22 ENCOUNTER — Telehealth (HOSPITAL_COMMUNITY): Payer: Medicaid Other | Admitting: Physician Assistant

## 2020-11-22 ENCOUNTER — Other Ambulatory Visit: Payer: Self-pay

## 2021-03-30 ENCOUNTER — Telehealth (HOSPITAL_COMMUNITY): Payer: Self-pay | Admitting: *Deleted

## 2021-03-30 ENCOUNTER — Other Ambulatory Visit (HOSPITAL_COMMUNITY): Payer: Self-pay | Admitting: Physician Assistant

## 2021-03-30 DIAGNOSIS — F411 Generalized anxiety disorder: Secondary | ICD-10-CM

## 2021-03-30 MED ORDER — HYDROXYZINE HCL 25 MG PO TABS: 25.0000 mg | ORAL_TABLET | Freq: Three times a day (TID) | ORAL | 1 refills | Status: DC | PRN

## 2021-03-30 NOTE — Telephone Encounter (Signed)
Refill request hydrOXYzine (ATARAX/VISTARIL) 25 MG tablet

## 2021-03-30 NOTE — Telephone Encounter (Signed)
Provider was contacted by Rushie Chestnut, RMA regarding medication refills. Patient's medication to be e-prescribed to pharmacy of choice.

## 2021-03-30 NOTE — Telephone Encounter (Signed)
Rx Refill Request: hydrOXYzine (ATARAX/VISTARIL) 25 MG tablet 

## 2021-03-30 NOTE — Progress Notes (Signed)
Provider was contacted by Claire Riley, RMA regarding medication refills. Patient's medication to be e-prescribed to pharmacy of choice.

## 2021-05-20 ENCOUNTER — Emergency Department (HOSPITAL_COMMUNITY): Payer: Medicaid Other

## 2021-05-20 ENCOUNTER — Emergency Department (HOSPITAL_COMMUNITY)
Admission: EM | Admit: 2021-05-20 | Discharge: 2021-05-20 | Discharge status: Against Medical Advice | Payer: Medicaid Other | Attending: Emergency Medicine | Admitting: Emergency Medicine

## 2021-05-20 ENCOUNTER — Encounter (HOSPITAL_COMMUNITY): Payer: Self-pay | Admitting: Emergency Medicine

## 2021-05-20 DIAGNOSIS — R0602 Shortness of breath: Secondary | ICD-10-CM | POA: Diagnosis not present

## 2021-05-20 DIAGNOSIS — Z5321 Procedure and treatment not carried out due to patient leaving prior to being seen by health care provider: Secondary | ICD-10-CM | POA: Insufficient documentation

## 2021-05-20 DIAGNOSIS — R002 Palpitations: Secondary | ICD-10-CM | POA: Diagnosis present

## 2021-05-20 DIAGNOSIS — M7989 Other specified soft tissue disorders: Secondary | ICD-10-CM | POA: Insufficient documentation

## 2021-05-20 LAB — COMPREHENSIVE METABOLIC PANEL
ALT: 11 U/L (ref 0–44)
AST: 15 U/L (ref 15–41)
Albumin: 3.7 g/dL (ref 3.5–5.0)
Alkaline Phosphatase: 52 U/L (ref 38–126)
Anion gap: 6 (ref 5–15)
BUN: 9 mg/dL (ref 6–20)
CO2: 24 mmol/L (ref 22–32)
Calcium: 8.8 mg/dL — ABNORMAL LOW (ref 8.9–10.3)
Chloride: 108 mmol/L (ref 98–111)
Creatinine, Ser: 0.72 mg/dL (ref 0.44–1.00)
GFR, Estimated: >60 mL/min (ref >60)
Glucose, Bld: 93 mg/dL (ref 70–99)
Potassium: 3.6 mmol/L (ref 3.5–5.1)
Sodium: 138 mmol/L (ref 135–145)
Total Bilirubin: 0.6 mg/dL (ref 0.3–1.2)
Total Protein: 6.2 g/dL — ABNORMAL LOW (ref 6.5–8.1)

## 2021-05-20 LAB — CBC WITH DIFFERENTIAL/PLATELET
Abs Immature Granulocytes: 0.02 10*3/uL (ref 0.00–0.07)
Basophils Absolute: 0 10*3/uL (ref 0.0–0.1)
Basophils Relative: 0 %
Eosinophils Absolute: 0.2 10*3/uL (ref 0.0–0.5)
Eosinophils Relative: 3 %
HCT: 35.9 % — ABNORMAL LOW (ref 36.0–46.0)
Hemoglobin: 10.8 g/dL — ABNORMAL LOW (ref 12.0–15.0)
Immature Granulocytes: 0 %
Lymphocytes Relative: 38 %
Lymphs Abs: 2.6 10*3/uL (ref 0.7–4.0)
MCH: 23.5 pg — ABNORMAL LOW (ref 26.0–34.0)
MCHC: 30.1 g/dL (ref 30.0–36.0)
MCV: 78.2 fL — ABNORMAL LOW (ref 80.0–100.0)
Monocytes Absolute: 0.3 10*3/uL (ref 0.1–1.0)
Monocytes Relative: 4 %
Neutro Abs: 3.6 10*3/uL (ref 1.7–7.7)
Neutrophils Relative %: 55 %
Platelets: 242 10*3/uL (ref 150–400)
RBC: 4.59 MIL/uL (ref 3.87–5.11)
RDW: 15.8 % — ABNORMAL HIGH (ref 11.5–15.5)
WBC: 6.7 10*3/uL (ref 4.0–10.5)
nRBC: 0 % (ref 0.0–0.2)

## 2021-05-20 LAB — BRAIN NATRIURETIC PEPTIDE: B Natriuretic Peptide: 30.2 pg/mL (ref 0.0–100.0)

## 2021-05-20 LAB — MAGNESIUM: Magnesium: 1.9 mg/dL (ref 1.7–2.4)

## 2021-05-20 NOTE — ED Provider Notes (Signed)
Emergency Medicine Provider Triage Evaluation Note  Claire Riley , a 35 y.o. female  was evaluated in triage.  Pt complains of palpitations.  Patient states that she has been experiencing intermittent palpitations, shortness of breath, as well as leg swelling for the past month.  Feels that her symptoms have been worsening the past 2 to 3 days.  She has been using an inhaler without relief.  Patient had an unremarkable EKG with EMS.  She states that she recently was evaluated by cardiology and wore a Holter monitor for 2 weeks which was removed yesterday.  She has a follow-up with cardiology in 5 days.  Physical Exam  BP 116/72   Pulse 73   Temp 98.3 F (36.8 C) (Oral)   Resp 16   Ht 6\' 2"  (1.88 m)   Wt 117.9 kg   SpO2 99%   BMI 33.38 kg/m  Gen:   Awake, no distress   Resp:  Normal effort  MSK:   Moves extremities without difficulty  Other:  RRR without M/R/G. LCTAB.  Medical Decision Making  Medically screening exam initiated at 4:37 PM.  Appropriate orders placed.  Meredyth Hornung was informed that the remainder of the evaluation will be completed by another provider, this initial triage assessment does not replace that evaluation, and the importance of remaining in the ED until their evaluation is complete.   [name], PA-C 05/20/21 1640    05/22/21, MD 05/21/21 1109

## 2021-05-20 NOTE — ED Notes (Addendum)
Pt called 4 times no answer 

## 2021-05-20 NOTE — ED Triage Notes (Signed)
Pt arrives via EMS with palpations and SOB intermittently since last night. No relief with inhaler. Unremarkable EKG with EMS. Pt endorses intermittent lower leg swelling and seeing cardiology about possible heart failure.

## 2021-05-26 ENCOUNTER — Other Ambulatory Visit: Payer: Self-pay

## 2021-05-26 ENCOUNTER — Telehealth (INDEPENDENT_AMBULATORY_CARE_PROVIDER_SITE_OTHER): Payer: Medicaid Other | Admitting: Physician Assistant

## 2021-05-26 DIAGNOSIS — F411 Generalized anxiety disorder: Secondary | ICD-10-CM | POA: Diagnosis not present

## 2021-05-26 DIAGNOSIS — F319 Bipolar disorder, unspecified: Secondary | ICD-10-CM | POA: Diagnosis not present

## 2021-05-26 DIAGNOSIS — F99 Mental disorder, not otherwise specified: Secondary | ICD-10-CM | POA: Diagnosis not present

## 2021-05-26 DIAGNOSIS — F5105 Insomnia due to other mental disorder: Secondary | ICD-10-CM

## 2021-05-27 DIAGNOSIS — F5105 Insomnia due to other mental disorder: Secondary | ICD-10-CM | POA: Insufficient documentation

## 2021-05-27 DIAGNOSIS — F411 Generalized anxiety disorder: Secondary | ICD-10-CM | POA: Insufficient documentation

## 2021-05-27 DIAGNOSIS — F319 Bipolar disorder, unspecified: Secondary | ICD-10-CM | POA: Insufficient documentation

## 2021-05-27 MED ORDER — ARIPIPRAZOLE 5 MG PO TABS: 5.0000 mg | ORAL_TABLET | Freq: Every day | ORAL | 1 refills | Status: DC

## 2021-05-27 MED ORDER — BUPROPION HCL ER (XL) 150 MG PO TB24: 150.0000 mg | ORAL_TABLET | ORAL | 1 refills | Status: DC

## 2021-05-27 MED ORDER — MELATONIN 5 MG PO TABS: 5.0000 mg | ORAL_TABLET | Freq: Every day | ORAL | 1 refills | Status: DC

## 2021-05-27 NOTE — Progress Notes (Signed)
BH MD/PA/NP OP Progress Note  Virtual Visit via Telephone Note  I connected with Claire Riley on 05/26/21 at  5:00 PM EDT by telephone and verified that I am speaking with the correct person using two identifiers.  Location: Patient: Home Provider: Working remotely   I discussed the limitations, risks, security and privacy concerns of performing an evaluation and management service by telephone and the availability of in person appointments. I also discussed with the patient that there may be a patient responsible charge related to this service. The patient expressed understanding and agreed to proceed.  Follow Up Instructions:  I discussed the assessment and treatment plan with the patient. The patient was provided an opportunity to ask questions and all were answered. The patient agreed with the plan and demonstrated an understanding of the instructions.   The patient was advised to call back or seek an in-person evaluation if the symptoms worsen or if the condition fails to improve as anticipated.  I provided 25 minutes of non-face-to-face time during this encounter.  Meta HatchetUchenna E Esterlene Atiyeh, PA   05/27/2021 8:12 AM Claire Riley  MRN:  [id number]  Chief Complaint: Follow up and medication management  HPI:   Claire Riley is a 35 year old female with a past psychiatric history significant for bipolar disorder, generalized anxiety disorder, insomnia, and ADHD [facility] via virtual telephone visit for follow-up and medication management.  Patient was last seen on 10/11/2020.  During the last encounter, patient was placed on the following medications:  Fluoxetine 20 mg daily Hydroxyzine 25 mg 3 times daily as needed Olanzapine 5 mg at bedtime Trazodone 50 mg at bedtime  Patient is not currently taking any medications at this time and does not remember the last time she took her medications.  Patient reports that she is also  seeing a provider due to issues with her heart (arrhythmia).  Patient was recently placed on a beta-blocker and is concerned that any new medications she is placed on will interact with her beta-blocker.  When taking her previous medications, patient stated that she experienced decreased sex drive as well as excessive drowsiness.  Patient also endorses depressive symptoms related to a number of stressors in her life including financial instability, living in a hotel, and minor setbacks involving her husband.  Patient denies anxiety and states that it has been manageable as of late.  A PHQ-9 screen was performed with the patient scoring a 21.  A GAD-7 screen was also performed with the patient scoring a 19.  A [place] Suicide Risk Severity Rating Scale was performed with the patient being considered high risk.  Patient is alert and oriented x4, calm, cooperative, and fully engaged in conversation during the encounter.  Patient endorses relaxed mood.  Patient denies suicidal ideations.  Patient denies feeling like a danger to herself and is able to contract for safety following the conclusion of the encounter.  Patient denies homicidal ideations.  She further denies auditory or visual hallucinations but states that she occasionally hears laughter in her head.  Patient endorses fair sleep and receives on average 6 to 7 hours of sleep each night.  Patient endorses good appetite and eats on average 3 meals per day with snacking in between.  Patient denies alcohol consumption and illicit drug use.  Patient endorses tobacco use and has on average 7 cigarettes/day.  Visit Diagnosis: No diagnosis found.  Past Psychiatric History:  Bipolar disorder Generalized anxiety disorder Insomnia ADHD Dissociative Identity  Disorder - dx at 39  Past Medical History:  Past Medical History:  Diagnosis Date   Anemia    Bronchial asthma    last attack 72yrs ago   Depression    Gonorrhea    Headache(784.0)    Pneumonia     Trichomonas    UTI (lower urinary tract infection)     Past Surgical History:  Procedure Laterality Date   WISDOM TOOTH EXTRACTION     2007    Family Psychiatric History:  Paternal Aunt - Bipolar Depression/Schizophrenia Paternal Uncle - PTSD, Personality Disorder Mother - Depression Sisters - Anxiety, Insomnia, Depression  Family History:  Family History  Problem Relation Age of Onset   Hypertension Father    Diabetes Father    COPD Father    Hypertension Mother    Scoliosis Mother    Diabetes Paternal Grandmother    Scoliosis Sister     Social History:  Social History   Socioeconomic History   Marital status: Married    Spouse name: Not on file   Number of children: Not on file   Years of education: Not on file   Highest education level: Not on file  Occupational History    Employer: [employer]    Comment: [employer]  Tobacco Use   Smoking status: Some Days    Packs/day: 0.25    Types: Cigarettes   Smokeless tobacco: Never  Vaping Use   Vaping Use: Never used  Substance and Sexual Activity   Alcohol use: No   Drug use: Yes    Types: Marijuana    Comment: last used 2 weeks ago   Sexual activity: Not Currently    Comment: Last intercourse 2 days ago  Other Topics Concern   Not on file  Social History Narrative   Not on file   Social Determinants of Health   Financial Resource Strain: Not on file  Food Insecurity: Not on file  Transportation Needs: Not on file  Physical Activity: Not on file  Stress: Not on file  Social Connections: Not on file    Allergies:  Allergies  Allergen Reactions   Tomato Hives    Metabolic Disorder Labs: Lab Results  Component Value Date   HGBA1C 5.3 08/17/2020   MPG 105 08/17/2020   No results found for: PROLACTIN Lab Results  Component Value Date   CHOL 154 08/17/2020   TRIG 77 08/17/2020   HDL 24 (L) 08/17/2020   CHOLHDL 6.4 08/17/2020   VLDL 15 08/17/2020   LDLCALC 115 (H)  08/17/2020   Lab Results  Component Value Date   TSH 0.699 08/17/2020    Therapeutic Level Labs: No results found for: LITHIUM No results found for: VALPROATE No components found for:  CBMZ  Current Medications: Current Outpatient Medications  Medication Sig Dispense Refill   etonogestrel (NEXPLANON) 68 MG IMPL implant 1 each by Subdermal route once.     FLUoxetine (PROZAC) 20 MG capsule Take 1 capsule (20 mg total) by mouth daily. 30 capsule 2   hydrOXYzine (ATARAX/VISTARIL) 25 MG tablet Take 1 tablet (25 mg total) by mouth 3 (three) times daily as needed for anxiety. 90 tablet 1   Multiple Vitamins-Minerals (MULTIVITAMIN WITH MINERALS) tablet Take 1 tablet by mouth daily.     OLANZapine (ZYPREXA) 5 MG tablet Take 1 tablet (5 mg total) by mouth at bedtime. 30 tablet 2   traZODone (DESYREL) 50 MG tablet Take 1 tablet (50 mg total) by mouth at bedtime as needed for  sleep. 30 tablet 2   No current facility-administered medications for this visit.     Musculoskeletal: Strength & Muscle Tone: Unable to assess due to telemedicine visit Gait & Station: Unable to assess due to telemedicine visit Patient leans: Unable to assess due to telemedicine visit  Psychiatric Specialty Exam: Review of Systems  Psychiatric/Behavioral:  Positive for sleep disturbance. Negative for decreased concentration, dysphoric mood, hallucinations, self-injury and suicidal ideas. The patient is nervous/anxious. The patient is not hyperactive.    There were no vitals taken for this visit.There is no height or weight on file to calculate BMI.  General Appearance: Unable to assess due to telemedicine visit  Eye Contact:  Unable to assess due to telemedicine visit  Speech:  Clear and Coherent and Normal Rate  Volume:  Normal  Mood:  Anxious and Depressed  Affect:  Congruent and Depressed  Thought Process:  Coherent, Goal Directed, and Descriptions of Associations: Intact  Orientation:  Full (Time, Place, and  Person)  Thought Content: WDL   Suicidal Thoughts:  No  Homicidal Thoughts:  No  Memory:  Immediate;   Good Recent;   Good Remote;   Good  Judgement:  Good  Insight:  Fair  Psychomotor Activity:  Normal  Concentration:  Concentration: Good and Attention Span: Good  Recall:  Good  Fund of Knowledge: Good  Language: Good  Akathisia:  NA  Handed:  Right  AIMS (if indicated): not done  Assets:  Communication Skills Desire for Improvement Social Support  ADL's:  Intact  Cognition: WNL  Sleep:  Fair   Screenings: GAD-7    Flowsheet Row [facility]/20[facility] in [facility]  Total GAD-7 Score 19      PHQ2-9    Flowsheet Row Video Visit from 05/26/2021 in [facility]  PHQ-2 Total Score 4  PHQ-9 Total Score 21      Flowsheet Row Video Visit from 05/26/2021 in [facility] ED from 05/20/2021 in [facility] EMERGENCY DEPARTMENT  C-SSRS RISK CATEGORY High Risk No Risk        Assessment and Plan:   Luis Sami is a 35 year old female with a past psychiatric history significant for bipolar disorder, generalized anxiety disorder, insomnia, and ADHD who presents to [facility] via virtual telephone visit for follow-up and medication management.  Patient reports that she is no longer taking her previous medication regimen.  Patient is interested in being placed on new medications that do not interfere with her beta-blocker.  Patient was recommended Abilify 5 mg daily for the management of her bipolar disorder.  Patient was also recommended Wellbutrin 150 mg 24-hour tablet daily for the management of her depressive symptoms.  Lastly, patient was placed on melatonin 5 mg at bedtime for the management of her sleep disturbances.  Patient was agreeable to recommendations.  Patient's medications to be e-prescribed to pharmacy of choice.  1.  Bipolar depression (HCC)  - ARIPiprazole (ABILIFY) 5 MG tablet; Take 1 tablet (5 mg total) by mouth daily.  Dispense: 30 tablet; Refill: 1 - buPROPion (WELLBUTRIN XL) 150 MG 24 hr tablet; Take 1 tablet (150 mg total) by mouth every morning.  Dispense: 30 tablet; Refill: 1  2. Insomnia due to other mental disorder  - melatonin 5 MG TABS; Take 1 tablet (5 mg total) by mouth at bedtime.  Dispense: 30 tablet; Refill: 1  3. Generalized anxiety disorder  Patient to follow up  in 6 weeks Provider spent a total of 25 minutes with the patient/reviewing patient's chart  Meta Hatchet, PA 05/27/2021, 8:12 AM

## 2021-06-01 ENCOUNTER — Ambulatory Visit: Payer: Self-pay | Admitting: Obstetrics and Gynecology

## 2021-06-04 ENCOUNTER — Encounter (HOSPITAL_COMMUNITY): Payer: Self-pay | Admitting: Physician Assistant

## 2021-08-03 ENCOUNTER — Other Ambulatory Visit: Payer: Self-pay

## 2021-08-03 ENCOUNTER — Telehealth (INDEPENDENT_AMBULATORY_CARE_PROVIDER_SITE_OTHER): Payer: Medicaid Other | Admitting: Physician Assistant

## 2021-08-03 ENCOUNTER — Encounter (HOSPITAL_COMMUNITY): Payer: Self-pay | Admitting: Physician Assistant

## 2021-08-03 DIAGNOSIS — F99 Mental disorder, not otherwise specified: Secondary | ICD-10-CM

## 2021-08-03 DIAGNOSIS — F5105 Insomnia due to other mental disorder: Secondary | ICD-10-CM

## 2021-08-03 DIAGNOSIS — F319 Bipolar disorder, unspecified: Secondary | ICD-10-CM

## 2021-08-03 DIAGNOSIS — F411 Generalized anxiety disorder: Secondary | ICD-10-CM

## 2021-08-03 MED ORDER — ARIPIPRAZOLE 5 MG PO TABS: 5.0000 mg | ORAL_TABLET | Freq: Every day | ORAL | 1 refills | Status: DC

## 2021-08-03 MED ORDER — MELATONIN 10 MG PO TABS: 10.0000 mg | ORAL_TABLET | Freq: Every day | ORAL | 1 refills | Status: DC

## 2021-08-03 MED ORDER — BUPROPION HCL ER (XL) 150 MG PO TB24: 150.0000 mg | ORAL_TABLET | ORAL | 1 refills | Status: DC

## 2021-08-03 NOTE — Progress Notes (Addendum)
BH MD/PA/NP OP Progress Note  Virtual Visit via Telephone Note  I connected with Claire Riley on 08/03/21 at  3:00 PM EDT by telephone and verified that I am speaking with the correct person using two identifiers.  Location: Patient: Home Provider: Clinic   I discussed the limitations, risks, security and privacy concerns of performing an evaluation and management service by telephone and the availability of in person appointments. I also discussed with the patient that there may be a patient responsible charge related to this service. The patient expressed understanding and agreed to proceed.  Follow Up Instructions:  I discussed the assessment and treatment plan with the patient. The patient was provided an opportunity to ask questions and all were answered. The patient agreed with the plan and demonstrated an understanding of the instructions.   The patient was advised to call back or seek an in-person evaluation if the symptoms worsen or if the condition fails to improve as anticipated.  I provided 14 minutes of non-face-to-face time during this encounter.  Meta Hatchet, PA    08/03/2021 11:06 PM Claire Riley  MRN:  [id number]  Chief Complaint: follow up and medication management  HPI:   Claire Riley is a 35 year old female with a past psychiatric history significant for bipolar disorder, anxiety, and insomnia who presents to [facility] via virtual telephone visit for follow-up and medication management.  Patient is currently being managed on the following medications:  Abilify 5 mg daily Bupropion (Wellbutrin XL) 150 mg 24-hour tablet daily Melatonin 5 mg daily  Reports no issues or concerns with her current medication regimen.  Patient denies the need for dosage adjustments at this time and is requesting refills on all her medications following the conclusion of the encounter.  Patient reports that her mood has improved and  that she has not noticed as many mood swings as of late.  Patient denies experiencing any adverse side effects from her medications such as suicidal thoughts, lack of motivation, fatigue, or sexual dysfunction.  Patient reports that she has noticed an increase in appetite as well as a decrease in the amount of panic attacks she has within a week.  Patient denies experiencing any depressive symptoms.  Patient rates her anxiety a 9.5-10 out of 10 stating that her anxiety tends to be mostly situational.  Patient reports that she recently started a new job and plans on purchasing a unit in an apartment.  She denies any new stressors at this time.  A PHQ-9 screen was performed with the patient scoring a 12.  A GAD-7 screen was also performed with the patient scoring a 14.  A [place] Suicide Severity Rating Scale was performed with the patient being considered at high risk.  Patient denies feeling like a danger to herself and is able to contract for safety following the conclusion of the encounter.  Patient is alert and oriented x4, pleasant, calm, cooperative, and fully engaged in conversation during the encounter.  Patient endorses good mood.  Patient denies suicidal or homicidal ideations.  She further denies auditory or visual hallucinations and does not appear to be responding to internal/external stimuli.  Patient endorses poor sleep and receives on average 3 to 4 hours of sleep each night.  Patient endorses good appetite and eats on average 3 meals per day along with a snack.  Patient denies alcohol use.  Patient endorses tobacco use and smokes on average 4 cigarettes/day.  Patient endorses illicit drug use in the  form of marijuana.  Visit Diagnosis:    ICD-10-CM   1. Bipolar depression (HCC)  F31.9 ARIPiprazole (ABILIFY) 5 MG tablet    buPROPion (WELLBUTRIN XL) 150 MG 24 hr tablet    2. Anxiety state  F41.1     3. Insomnia due to other mental disorder  F51.05 melatonin 10 MG TABS   F99        Past Psychiatric History:  Bipolar disorder Generalized anxiety disorder Insomnia ADHD Dissociative Identity Disorder - dx at 40  Past Medical History:  Past Medical History:  Diagnosis Date   Anemia    Bronchial asthma    last attack 9yrs ago   Depression    Gonorrhea    Headache(784.0)    Pneumonia    Trichomonas    UTI (lower urinary tract infection)     Past Surgical History:  Procedure Laterality Date   WISDOM TOOTH EXTRACTION     2007    Family Psychiatric History:  Paternal Aunt - Bipolar Depression/Schizophrenia Paternal Uncle - PTSD, Personality Disorder Mother - Depression Sisters - Anxiety, Insomnia, Depression  Family History:  Family History  Problem Relation Age of Onset   Hypertension Father    Diabetes Father    COPD Father    Hypertension Mother    Scoliosis Mother    Diabetes Paternal Grandmother    Scoliosis Sister     Social History:  Social History   Socioeconomic History   Marital status: Married    Spouse name: Not on file   Number of children: Not on file   Years of education: Not on file   Highest education level: Not on file  Occupational History    Employer: [employer]    Comment: [employer]  Tobacco Use   Smoking status: Some Days    Packs/day: 0.25    Types: Cigarettes   Smokeless tobacco: Never  Vaping Use   Vaping Use: Never used  Substance and Sexual Activity   Alcohol use: No   Drug use: Yes    Types: Marijuana    Comment: last used 2 weeks ago   Sexual activity: Not Currently    Comment: Last intercourse 2 days ago  Other Topics Concern   Not on file  Social History Narrative   Not on file   Social Determinants of Health   Financial Resource Strain: Not on file  Food Insecurity: Not on file  Transportation Needs: Not on file  Physical Activity: Not on file  Stress: Not on file  Social Connections: Not on file    Allergies:  Allergies  Allergen Reactions   Tomato Hives     Metabolic Disorder Labs: Lab Results  Component Value Date   HGBA1C 5.3 08/17/2020   MPG 105 08/17/2020   No results found for: PROLACTIN Lab Results  Component Value Date   CHOL 154 08/17/2020   TRIG 77 08/17/2020   HDL 24 (L) 08/17/2020   CHOLHDL 6.4 08/17/2020   VLDL 15 08/17/2020   LDLCALC 115 (H) 08/17/2020   Lab Results  Component Value Date   TSH 0.699 08/17/2020    Therapeutic Level Labs: No results found for: LITHIUM No results found for: VALPROATE No components found for:  CBMZ  Current Medications: Current Outpatient Medications  Medication Sig Dispense Refill   ARIPiprazole (ABILIFY) 5 MG tablet Take 1 tablet (5 mg total) by mouth daily. 30 tablet 1   buPROPion (WELLBUTRIN XL) 150 MG 24 hr tablet Take 1 tablet (150 mg total) by  mouth every morning. 30 tablet 1   etonogestrel (NEXPLANON) 68 MG IMPL implant 1 each by Subdermal route once.     FLUoxetine (PROZAC) 20 MG capsule Take 1 capsule (20 mg total) by mouth daily. 30 capsule 2   hydrOXYzine (ATARAX/VISTARIL) 25 MG tablet Take 1 tablet (25 mg total) by mouth 3 (three) times daily as needed for anxiety. 90 tablet 1   melatonin 10 MG TABS Take 10 mg by mouth at bedtime. 30 tablet 1   Multiple Vitamins-Minerals (MULTIVITAMIN WITH MINERALS) tablet Take 1 tablet by mouth daily.     traZODone (DESYREL) 50 MG tablet Take 1 tablet (50 mg total) by mouth at bedtime as needed for sleep. 30 tablet 2   No current facility-administered medications for this visit.     Musculoskeletal: Strength & Muscle Tone: Unable to assess due to telemedicine visit Gait & Station: Unable to assess due to telemedicine visit Patient leans: Unable to assess due to telemedicine visit  Psychiatric Specialty Exam: Review of Systems  Psychiatric/Behavioral:  Positive for sleep disturbance. Negative for decreased concentration, dysphoric mood, hallucinations, self-injury and suicidal ideas. The patient is nervous/anxious. The patient  is not hyperactive.    There were no vitals taken for this visit.There is no height or weight on file to calculate BMI.  General Appearance: Unable to assess due to telemedicine visit  Eye Contact:  Unable to assess due to telemedicine visit  Speech:  Clear and Coherent and Normal Rate  Volume:  Normal  Mood:  Anxious and Euthymic  Affect:  Appropriate and Congruent  Thought Process:  Coherent, Goal Directed, and Descriptions of Associations: Intact  Orientation:  Full (Time, Place, and Person)  Thought Content: WDL   Suicidal Thoughts:  No  Homicidal Thoughts:  No  Memory:  Immediate;   Good Recent;   Good Remote;   Good  Judgement:  Good  Insight:  Fair  Psychomotor Activity:  Normal  Concentration:  Concentration: Good and Attention Span: Good  Recall:  Good  Fund of Knowledge: Good  Language: Good  Akathisia:  NA  Handed:  Right  AIMS (if indicated): not done  Assets:  Communication Skills Desire for Improvement Social Support  ADL's:  Intact  Cognition: WNL  Sleep:  Fair   Screenings: GAD-7    Flowsheet Row Video Visit from 08/03/2021 in [facility] Video Visit from 05/26/2021 in [facility]  Total GAD-7 Score 14 19      PHQ2-9    Flowsheet Row Video Visit from 08/03/2021 in [facility] Video Visit from 05/26/2021 in [facility]  PHQ-2 Total Score 3 4  PHQ-9 Total Score 12 21      Flowsheet Row Video Visit from 08/03/2021 in [facility] Video Visit from 05/26/2021 in [facility] ED from 05/20/2021 in [facility] EMERGENCY DEPARTMENT  C-SSRS RISK CATEGORY High Risk High Risk No Risk        Assessment and Plan:   Claire Riley is a 35 year old female with a past psychiatric history significant for bipolar disorder, anxiety, and insomnia who presents to [facility] via virtual telephone visit for follow-up and medication management.  Patient reports no issues or concerns regarding her current medication regimen.  Patient denies experiencing any depressive symptoms, however, she still endorses elevated anxiety.  Patient also reports that she still has trouble with sleep.  Patient  was recommended increasing her melatonin dosage from 5 mg to 10 mg at bedtime.  Patient was agreeable to recommendation.  Patient's medications to be e- prescribed to pharmacy of choice.  1. Bipolar depression (HCC)  - ARIPiprazole (ABILIFY) 5 MG tablet; Take 1 tablet (5 mg total) by mouth daily.  Dispense: 30 tablet; Refill: 1 - buPROPion (WELLBUTRIN XL) 150 MG 24 hr tablet; Take 1 tablet (150 mg total) by mouth every morning.  Dispense: 30 tablet; Refill: 1  2. Anxiety state   3. Insomnia due to other mental disorder  - melatonin 10 MG TABS; Take 10 mg by mouth at bedtime.  Dispense: 30 tablet; Refill: 1  Patient to follow up in 2 months Provider spent a total of 14 minutes with the patient/reviewing patient's chart  Meta Hatchet, PA 08/03/2021, 11:06 PM

## 2021-08-14 ENCOUNTER — Ambulatory Visit (HOSPITAL_COMMUNITY)
Admission: EM | Admit: 2021-08-14 | Discharge: 2021-08-14 | Disposition: A | Discharge status: Home / Self Care | Payer: Medicaid Other | Attending: Internal Medicine | Admitting: Internal Medicine

## 2021-08-14 ENCOUNTER — Other Ambulatory Visit: Payer: Self-pay

## 2021-08-14 ENCOUNTER — Encounter (HOSPITAL_COMMUNITY): Payer: Self-pay | Admitting: *Deleted

## 2021-08-14 DIAGNOSIS — K047 Periapical abscess without sinus: Secondary | ICD-10-CM | POA: Diagnosis not present

## 2021-08-14 MED ORDER — AMOXICILLIN-POT CLAVULANATE 875-125 MG PO TABS: 1.0000 | ORAL_TABLET | Freq: Two times a day (BID) | ORAL | 0 refills | Status: DC

## 2021-08-14 MED ORDER — LIDOCAINE VISCOUS HCL 2 % MT SOLN: 15.0000 mL | OROMUCOSAL | 0 refills | Status: DC | PRN

## 2021-08-14 MED ORDER — IBUPROFEN 600 MG PO TABS
600.0000 mg | ORAL_TABLET | Freq: Four times a day (QID) | ORAL | 0 refills | Status: DC | PRN
Start: 2021-08-14 — End: 2021-10-17

## 2021-08-14 NOTE — ED Triage Notes (Signed)
Swelling to rt side of face started over the last couple of days.

## 2021-08-14 NOTE — Discharge Instructions (Addendum)
Take medications as prescribed Follow-up with your dentist to have dental extraction If you have worsening symptoms please return to urgent care to be reevaluated.

## 2021-08-15 NOTE — ED Provider Notes (Signed)
[facility]    CSN: [id number] Arrival date & time: 08/14/21  2633      History   Chief Complaint Chief Complaint  Patient presents with   Facial Swelling   Dental Pain    HPI Claire Riley is a 35 y.o. female comes to the urgent care with 1 week history of painful left-sided facial swelling.  Patient symptoms started with a tooth ache a week ago.  Symptoms have worsened.  Pain is currently severe, throbbing with no known relieving factors.  She has poor dental hygiene.  No fever or chills.  Dental cavities present. 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 Authorizing Provider  amoxicillin-clavulanate (AUGMENTIN) 875-125 MG tablet Take 1 tablet by mouth every 12 (twelve) hours. 08/14/21  Yes Zaccheaus Storlie, Britta Mccreedy, MD  ibuprofen (ADVIL) 600 MG tablet Take 1 tablet (600 mg total) by mouth every 6 (six) hours as needed. 08/14/21  Yes Briane Birden, Britta Mccreedy, MD  lidocaine (XYLOCAINE) 2 % solution Use as directed 15 mLs in the mouth or throat as needed for mouth pain. 08/14/21  Yes Rondy Krupinski, Britta Mccreedy, MD  ARIPiprazole (ABILIFY) 5 MG tablet Take 1 tablet (5 mg total) by mouth daily. 08/03/21 08/03/22  Nwoko, Tommas Olp, PA  buPROPion (WELLBUTRIN XL) 150  MG 24 hr tablet Take 1 tablet (150 mg total) by mouth every morning. 08/03/21 08/03/22  Nwoko, Tommas Olp, PA  etonogestrel (NEXPLANON) 68 MG IMPL implant 1 each by Subdermal route once.    [provider]  FLUoxetine (PROZAC) 20 MG capsule Take 1 capsule (20 mg total) by mouth daily. 10/11/20   Nwoko, Tommas Olp, PA  hydrOXYzine (ATARAX/VISTARIL) 25 MG tablet Take 1 tablet (25 mg total) by mouth 3 (three) times daily as needed for anxiety. 03/30/21   Nwoko, Uchenna E, PA  melatonin 10 MG TABS Take 10 mg by mouth at bedtime. 08/03/21   Nwoko, Tommas Olp, PA  Multiple Vitamins-Minerals (MULTIVITAMIN WITH MINERALS) tablet Take 1 tablet by mouth daily.    [provider]  traZODone (DESYREL) 50 MG tablet Take 1 tablet (50 mg total) by mouth at bedtime as needed for sleep. 10/11/20   Meta Hatchet, PA    Family History Family History  Problem Relation Age of Onset   Hypertension Mother    Scoliosis Mother    Hypertension Father  Diabetes Father    COPD Father    Scoliosis Sister    Diabetes Paternal Grandmother     Social History Social History   Tobacco Use   Smoking status: Some Days    Packs/day: 0.25    Types: Cigarettes   Smokeless tobacco: Never  Vaping Use   Vaping Use: Never used  Substance Use Topics   Alcohol use: No   Drug use: Yes    Types: Marijuana    Comment: last used 2 weeks ago     Allergies   Tomato   Review of Systems Review of Systems  HENT:  Positive for dental problem and facial swelling. Negative for mouth sores and nosebleeds.     Physical Exam Triage Vital Signs ED Triage Vitals  Enc Vitals Group     BP 08/14/21 1100 132/82     Pulse Rate 08/14/21 1100 86     Resp 08/14/21 1100 20     Temp 08/14/21 1100 98 F (36.7 C)     Temp src --      SpO2 08/14/21 1100 97 %     Weight --      Height --      Head Circumference --      Peak Flow --      Pain Score 08/14/21 1057 10     Pain Loc --      Pain Edu? --      Excl. in  GC? --    No data found.  Updated Vital Signs BP 132/82   Pulse 86   Temp 98 F (36.7 C)   Resp 20   LMP 07/28/2021   SpO2 97%   Visual Acuity Right Eye Distance:   Left Eye Distance:   Bilateral Distance:    Right Eye Near:   Left Eye Near:    Bilateral Near:     Physical Exam Vitals and nursing note reviewed.  Constitutional:      General: She is not in acute distress.    Appearance: She is not ill-appearing.  HENT:     Nose:     Comments: Left-sided facial swelling.    Mouth/Throat:     Comments: Dental cavity in the second right mandibular molar.  Gum is swollen, tender and erythematous Cardiovascular:     Pulses: Normal pulses.     Heart sounds: Normal heart sounds.  Musculoskeletal:        General: Normal range of motion.  Neurological:     Mental Status: She is alert.     UC Treatments / Results  Labs (all labs ordered are listed, but only abnormal results are displayed) Labs Reviewed - No data to display  EKG   Radiology No results found.  Procedures Procedures (including critical care time)  Medications Ordered in UC Medications - No data to display  Initial Impression / Assessment and Plan / UC Course  I have reviewed the triage vital signs and the nursing notes.  Pertinent labs & imaging results that were available during my care of the patient were reviewed by me and considered in my medical decision making (see chart for details).     1.  Dental abscess: Augmentin 1 tablet twice daily for 7 days Ibuprofen 600 mg every 6 hours as needed for pain Follow-up with your dentist Lidocaine gel as needed for oral pain. Return precautions given. Final Clinical Impressions(s) / UC Diagnoses   Final diagnoses:  Dental abscess     Discharge Instructions  Take medications as prescribed Follow-up with your dentist to have dental extraction If you have worsening symptoms please return to urgent care to be reevaluated.   ED  Prescriptions     Medication Sig Dispense Auth. Provider   amoxicillin-clavulanate (AUGMENTIN) 875-125 MG tablet Take 1 tablet by mouth every 12 (twelve) hours. 14 tablet Glynn Freas, Britta Mccreedy, MD   ibuprofen (ADVIL) 600 MG tablet Take 1 tablet (600 mg total) by mouth every 6 (six) hours as needed. 30 tablet Cole Klugh, Britta Mccreedy, MD   lidocaine (XYLOCAINE) 2 % solution Use as directed 15 mLs in the mouth or throat as needed for mouth pain. 100 mL Rushil Kimbrell, Britta Mccreedy, MD      PDMP not reviewed this encounter.   Merrilee Jansky, MD 08/15/21 [phone]

## 2021-08-24 ENCOUNTER — Encounter: Payer: Self-pay | Admitting: Obstetrics & Gynecology

## 2021-10-04 ENCOUNTER — Telehealth (INDEPENDENT_AMBULATORY_CARE_PROVIDER_SITE_OTHER): Payer: Medicaid Other | Admitting: Physician Assistant

## 2021-10-04 DIAGNOSIS — F411 Generalized anxiety disorder: Secondary | ICD-10-CM

## 2021-10-04 DIAGNOSIS — F99 Mental disorder, not otherwise specified: Secondary | ICD-10-CM

## 2021-10-04 DIAGNOSIS — F5105 Insomnia due to other mental disorder: Secondary | ICD-10-CM

## 2021-10-04 DIAGNOSIS — F319 Bipolar disorder, unspecified: Secondary | ICD-10-CM | POA: Diagnosis not present

## 2021-10-05 MED ORDER — BUPROPION HCL ER (XL) 150 MG PO TB24: 150.0000 mg | ORAL_TABLET | ORAL | 1 refills | Status: DC

## 2021-10-05 MED ORDER — BUSPIRONE HCL 7.5 MG PO TABS: 7.5000 mg | ORAL_TABLET | Freq: Two times a day (BID) | ORAL | 1 refills | Status: DC

## 2021-10-05 MED ORDER — MELATONIN 10 MG PO TABS: 10.0000 mg | ORAL_TABLET | Freq: Every day | ORAL | 1 refills | Status: AC

## 2021-10-05 MED ORDER — ARIPIPRAZOLE 5 MG PO TABS: 5.0000 mg | ORAL_TABLET | Freq: Every day | ORAL | 1 refills | Status: DC

## 2021-10-05 NOTE — Progress Notes (Addendum)
BH MD/PA/NP OP Progress Note  Virtual Visit via Telephone Note  I connected with Claire Riley on 10/07/21 at  3:00 PM EST by telephone and verified that I am speaking with the correct person using two identifiers.  Location: Patient: Home Provider: Clinic   I discussed the limitations, risks, security and privacy concerns of performing an evaluation and management service by telephone and the availability of in person appointments. I also discussed with the patient that there may be a patient responsible charge related to this service. The patient expressed understanding and agreed to proceed.  Follow Up Instructions:  I discussed the assessment and treatment plan with the patient. The patient was provided an opportunity to ask questions and all were answered. The patient agreed with the plan and demonstrated an understanding of the instructions.   The patient was advised to call back or seek an in-person evaluation if the symptoms worsen or if the condition fails to improve as anticipated.  I provided 13 minutes of non-face-to-face time during this encounter.  Meta Hatchet, PA    10/05/2021 12:03 AM Shanika Levings  MRN:  [id number]  Chief Complaint: Follow up and medication management  HPI:   Claire Riley is a 35 year old female with a past psychiatric history significant for generalized anxiety disorder, bipolar disorder, and insomnia who presents to [facility] via virtual telephone visit for follow-up and medication management.  Patient is currently being managed on the following medications:  Abilify 5 mg daily Bupropion (Wellbutrin XL) 150 mg 24-hour tablet daily Melatonin 10 mg at bedtime  Patient reports no issues or concerns regarding her current medication regimen.  Patient reports that her melatonin has been very helpful in helping her to fall and stay asleep.  She states that her mood has been a lot better and she feels  that things are finally falling into place.  She reports that she recently got a new job and will be getting a new apartment soon, stark contrast from living in a hotel with her husband for the last 3 years.  Patient states that she still experiences some depressive episodes week and an week out.  Patient endorses the following depressive symptoms: irritability, self-isolation, difficulty getting out of bed, and increased sleep.  Patient endorses anxiety at random times.  She rates her anxiety a 9 out of 10 but states that her medications help her to calm down.  Patient is alert and oriented x4, calm, cooperative, and fully engaged in conversation during the encounter.  Patient endorses a little stressed and states that she is currently relaxing due to the pain she is currently experiencing.  Patient denies suicidal or homicidal ideations.  She further denies active auditory or visual hallucinations and does not appear to be responding to internal/external stimuli.  She states that a week ago, she experienced arguing voices.  Patient endorses fair sleep and receives on average 4 to 5 hours of sleep each night.  Patient endorses good appetite and eats on average 3 meals per day.  Patient denies alcohol consumption and illicit drug use.  Patient endorses tobacco use and smokes on average 4 to 5 cigarettes/day.  Visit Diagnosis:    ICD-10-CM   1. Generalized anxiety disorder  F41.1 busPIRone (BUSPAR) 7.5 MG tablet    2. Bipolar depression (HCC)  F31.9 buPROPion (WELLBUTRIN XL) 150 MG 24 hr tablet    ARIPiprazole (ABILIFY) 5 MG tablet    3. Insomnia due to other mental disorder  F51.05  Melatonin 10 MG TABS   F99       Past Psychiatric History:  Bipolar disorder Generalized anxiety disorder Insomnia ADHD Dissociative Identity Disorder - dx at 36  Past Medical History:  Past Medical History:  Diagnosis Date   Anemia    Bronchial asthma    last attack 47yrs ago   Depression    Gonorrhea     Headache(784.0)    Pneumonia    Trichomonas    UTI (lower urinary tract infection)     Past Surgical History:  Procedure Laterality Date   WISDOM TOOTH EXTRACTION     2007    Family Psychiatric History:  Paternal Aunt - Bipolar Depression/Schizophrenia Paternal Uncle - PTSD, Personality Disorder Mother - Depression Sisters - Anxiety, Insomnia, Depression  Family History:  Family History  Problem Relation Age of Onset   Hypertension Mother    Scoliosis Mother    Hypertension Father    Diabetes Father    COPD Father    Scoliosis Sister    Diabetes Paternal Grandmother     Social History:  Social History   Socioeconomic History   Marital status: Married    Spouse name: Not on file   Number of children: Not on file   Years of education: Not on file   Highest education level: Not on file  Occupational History    Employer: [employer]    Comment: [employer]  Tobacco Use   Smoking status: Some Days    Packs/day: 0.25    Types: Cigarettes   Smokeless tobacco: Never  Vaping Use   Vaping Use: Never used  Substance and Sexual Activity   Alcohol use: No   Drug use: Yes    Types: Marijuana    Comment: last used 2 weeks ago   Sexual activity: Not Currently    Comment: Last intercourse 2 days ago  Other Topics Concern   Not on file  Social History Narrative   Not on file   Social Determinants of Health   Financial Resource Strain: Not on file  Food Insecurity: Not on file  Transportation Needs: Not on file  Physical Activity: Not on file  Stress: Not on file  Social Connections: Not on file    Allergies:  Allergies  Allergen Reactions   Tomato Hives    Metabolic Disorder Labs: Lab Results  Component Value Date   HGBA1C 5.3 08/17/2020   MPG 105 08/17/2020   No results found for: PROLACTIN Lab Results  Component Value Date   CHOL 154 08/17/2020   TRIG 77 08/17/2020   HDL 24 (L) 08/17/2020   CHOLHDL 6.4 08/17/2020   VLDL 15  08/17/2020   LDLCALC 115 (H) 08/17/2020   Lab Results  Component Value Date   TSH 0.699 08/17/2020    Therapeutic Level Labs: No results found for: LITHIUM No results found for: VALPROATE No components found for:  CBMZ  Current Medications: Current Outpatient Medications  Medication Sig Dispense Refill   busPIRone (BUSPAR) 7.5 MG tablet Take 1 tablet (7.5 mg total) by mouth 2 (two) times daily. 60 tablet 1   amoxicillin-clavulanate (AUGMENTIN) 875-125 MG tablet Take 1 tablet by mouth every 12 (twelve) hours. 14 tablet 0   ARIPiprazole (ABILIFY) 5 MG tablet Take 1 tablet (5 mg total) by mouth daily. 30 tablet 1   buPROPion (WELLBUTRIN XL) 150 MG 24 hr tablet Take 1 tablet (150 mg total) by mouth every morning. 30 tablet 1   etonogestrel (NEXPLANON) 68 MG IMPL implant  1 each by Subdermal route once.     FLUoxetine (PROZAC) 20 MG capsule Take 1 capsule (20 mg total) by mouth daily. 30 capsule 2   hydrOXYzine (ATARAX/VISTARIL) 25 MG tablet Take 1 tablet (25 mg total) by mouth 3 (three) times daily as needed for anxiety. 90 tablet 1   ibuprofen (ADVIL) 600 MG tablet Take 1 tablet (600 mg total) by mouth every 6 (six) hours as needed. 30 tablet 0   lidocaine (XYLOCAINE) 2 % solution Use as directed 15 mLs in the mouth or throat as needed for mouth pain. 100 mL 0   Melatonin 10 MG TABS Take 10 mg by mouth at bedtime. 30 tablet 1   Multiple Vitamins-Minerals (MULTIVITAMIN WITH MINERALS) tablet Take 1 tablet by mouth daily.     traZODone (DESYREL) 50 MG tablet Take 1 tablet (50 mg total) by mouth at bedtime as needed for sleep. 30 tablet 2   No current facility-administered medications for this visit.     Musculoskeletal: Strength & Muscle Tone: Unable to assess due to telemedicine visit Gait & Station: Unable to assess due to telemedicine visit Patient leans: Unable to assess due to telemedicine visit  Psychiatric Specialty Exam: Review of Systems  Psychiatric/Behavioral:  Positive  for sleep disturbance. Negative for decreased concentration, dysphoric mood, hallucinations, self-injury and suicidal ideas. The patient is nervous/anxious. The patient is not hyperactive.    There were no vitals taken for this visit.There is no height or weight on file to calculate BMI.  General Appearance: Unable to assess due to telemedicine visit  Eye Contact:  Unable to assess due to telemedicine visit  Speech:  Clear and Coherent and Normal Rate  Volume:  Normal  Mood:  Anxious and Euthymic  Affect:  Appropriate and Congruent  Thought Process:  Coherent, Goal Directed, and Descriptions of Associations: Intact  Orientation:  Full (Time, Place, and Person)  Thought Content: WDL   Suicidal Thoughts:  No  Homicidal Thoughts:  No  Memory:  Immediate;   Good Recent;   Good Remote;   Good  Judgement:  Good  Insight:  Fair  Psychomotor Activity:  Normal  Concentration:  Concentration: Good and Attention Span: Good  Recall:  Good  Fund of Knowledge: Good  Language: Good  Akathisia:  NA  Handed:  Right  AIMS (if indicated): not done  Assets:  Communication Skills Desire for Improvement Social Support  ADL's:  Intact  Cognition: WNL  Sleep:  Fair   Screenings: GAD-7    Flowsheet Row Video Visit from 10/04/2021 in [facility] Video Visit from 08/03/2021 in [facility] Video Visit from 05/26/2021 in [facility]  Total GAD-7 Score 21 14 19       PHQ2-9    Flowsheet Row Video Visit from 10/04/2021 in [facility] Video Visit from 08/03/2021 in [facility] Video Visit from 05/26/2021 in [facility]  PHQ-2 Total Score 1 3 4   PHQ-9 Total Score -- 12 21      Flowsheet Row Video Visit from 10/04/2021 in [facility] ED from 08/14/2021 in [facility] Video Visit from  08/03/2021 in [facility]  C-SSRS RISK CATEGORY Low Risk No Risk High Risk        Assessment and Plan:   Claire Riley is a 35 year old female with a past psychiatric history significant for generalized anxiety disorder,  bipolar disorder, and insomnia who presents to [facility] via virtual telephone visit for follow-up and medication management.  Patient reports that her medications have been helpful in the management of her symptoms.  It appears her depressive symptoms appears to be well managed, however, she still experiences elevated anxiety.  Patient was recommended buspirone 7.5 mg 2 times daily for the management of her anxiety.  Patient was agreeable to recommendations.  Patient to continue taking all other medications as prescribed.  Patient's medications to be e-prescribed to pharmacy of choice.  1. Bipolar depression (HCC)  - buPROPion (WELLBUTRIN XL) 150 MG 24 hr tablet; Take 1 tablet (150 mg total) by mouth every morning.  Dispense: 30 tablet; Refill: 1 - ARIPiprazole (ABILIFY) 5 MG tablet; Take 1 tablet (5 mg total) by mouth daily.  Dispense: 30 tablet; Refill: 1  2. Insomnia due to other mental disorder  - Melatonin 10 MG TABS; Take 10 mg by mouth at bedtime.  Dispense: 30 tablet; Refill: 1  3. Generalized anxiety disorder  - busPIRone (BUSPAR) 7.5 MG tablet; Take 1 tablet (7.5 mg total) by mouth 2 (two) times daily.  Dispense: 60 tablet; Refill: 1  Patient to follow up in 2 months Provider spent a total of 13 minutes with the patient/reviewing patient's chart  Meta Hatchet, PA 10/05/2021, 12:03 AM

## 2021-10-07 ENCOUNTER — Encounter (HOSPITAL_COMMUNITY): Payer: Self-pay | Admitting: Physician Assistant

## 2021-10-17 ENCOUNTER — Emergency Department (HOSPITAL_COMMUNITY)
Admission: EM | Admit: 2021-10-17 | Discharge: 2021-10-17 | Disposition: A | Discharge status: Home / Self Care | Payer: Medicaid Other | Attending: Emergency Medicine | Admitting: Emergency Medicine

## 2021-10-17 ENCOUNTER — Other Ambulatory Visit: Payer: Self-pay

## 2021-10-17 DIAGNOSIS — Z79899 Other long term (current) drug therapy: Secondary | ICD-10-CM | POA: Diagnosis not present

## 2021-10-17 DIAGNOSIS — Z20822 Contact with and (suspected) exposure to covid-19: Secondary | ICD-10-CM | POA: Diagnosis not present

## 2021-10-17 DIAGNOSIS — F5105 Insomnia due to other mental disorder: Secondary | ICD-10-CM

## 2021-10-17 DIAGNOSIS — F411 Generalized anxiety disorder: Secondary | ICD-10-CM

## 2021-10-17 DIAGNOSIS — F319 Bipolar disorder, unspecified: Secondary | ICD-10-CM

## 2021-10-17 DIAGNOSIS — F419 Anxiety disorder, unspecified: Secondary | ICD-10-CM | POA: Insufficient documentation

## 2021-10-17 DIAGNOSIS — Y900 Blood alcohol level of less than 20 mg/100 ml: Secondary | ICD-10-CM | POA: Insufficient documentation

## 2021-10-17 DIAGNOSIS — R0789 Other chest pain: Secondary | ICD-10-CM | POA: Diagnosis not present

## 2021-10-17 DIAGNOSIS — F17210 Nicotine dependence, cigarettes, uncomplicated: Secondary | ICD-10-CM | POA: Insufficient documentation

## 2021-10-17 LAB — COMPREHENSIVE METABOLIC PANEL
ALT: 32 U/L (ref 0–44)
AST: 38 U/L (ref 15–41)
Albumin: 4.3 g/dL (ref 3.5–5.0)
Alkaline Phosphatase: 62 U/L (ref 38–126)
Anion gap: 10 (ref 5–15)
BUN: 11 mg/dL (ref 6–20)
CO2: 23 mmol/L (ref 22–32)
Calcium: 9 mg/dL (ref 8.9–10.3)
Chloride: 105 mmol/L (ref 98–111)
Creatinine, Ser: 0.86 mg/dL (ref 0.44–1.00)
GFR, Estimated: >60 mL/min (ref >60)
Glucose, Bld: 104 mg/dL — ABNORMAL HIGH (ref 70–99)
Potassium: 3.6 mmol/L (ref 3.5–5.1)
Sodium: 138 mmol/L (ref 135–145)
Total Bilirubin: 0.6 mg/dL (ref 0.3–1.2)
Total Protein: 6.8 g/dL (ref 6.5–8.1)

## 2021-10-17 LAB — RESP PANEL BY RT-PCR (FLU A&B, COVID) ARPGX2
Influenza A by PCR: NEGATIVE
Influenza B by PCR: NEGATIVE
SARS Coronavirus 2 by RT PCR: NEGATIVE

## 2021-10-17 LAB — RAPID URINE DRUG SCREEN, HOSP PERFORMED
Amphetamines: NOT DETECTED
Barbiturates: NOT DETECTED
Benzodiazepines: NOT DETECTED
Cocaine: POSITIVE — AB
Opiates: NOT DETECTED
Tetrahydrocannabinol: POSITIVE — AB

## 2021-10-17 LAB — CBC WITH DIFFERENTIAL/PLATELET
Abs Immature Granulocytes: 0.06 10*3/uL (ref 0.00–0.07)
Basophils Absolute: 0 10*3/uL (ref 0.0–0.1)
Basophils Relative: 0 %
Eosinophils Absolute: 0.1 10*3/uL (ref 0.0–0.5)
Eosinophils Relative: 2 %
HCT: 39.4 % (ref 36.0–46.0)
Hemoglobin: 12.2 g/dL (ref 12.0–15.0)
Immature Granulocytes: 1 %
Lymphocytes Relative: 35 %
Lymphs Abs: 3.1 10*3/uL (ref 0.7–4.0)
MCH: 24.9 pg — ABNORMAL LOW (ref 26.0–34.0)
MCHC: 31 g/dL (ref 30.0–36.0)
MCV: 80.4 fL (ref 80.0–100.0)
Monocytes Absolute: 0.5 10*3/uL (ref 0.1–1.0)
Monocytes Relative: 6 %
Neutro Abs: 5 10*3/uL (ref 1.7–7.7)
Neutrophils Relative %: 56 %
Platelets: 262 10*3/uL (ref 150–400)
RBC: 4.9 MIL/uL (ref 3.87–5.11)
RDW: 15.4 % (ref 11.5–15.5)
WBC: 8.8 10*3/uL (ref 4.0–10.5)
nRBC: 0 % (ref 0.0–0.2)

## 2021-10-17 LAB — ETHANOL: Alcohol, Ethyl (B): <10 mg/dL (ref <10)

## 2021-10-17 LAB — SALICYLATE LEVEL: Salicylate Lvl: <7 mg/dL — ABNORMAL LOW (ref 7.0–30.0)

## 2021-10-17 LAB — I-STAT BETA HCG BLOOD, ED (MC, WL, AP ONLY): I-stat hCG, quantitative: <5 m[IU]/mL (ref <5)

## 2021-10-17 LAB — ACETAMINOPHEN LEVEL: Acetaminophen (Tylenol), Serum: <10 ug/mL — ABNORMAL LOW (ref 10–30)

## 2021-10-17 MED ORDER — HYDROXYZINE HCL 25 MG PO TABS: 25.0000 mg | ORAL_TABLET | Freq: Three times a day (TID) | ORAL | 1 refills | Status: DC | PRN

## 2021-10-17 MED ORDER — BUSPIRONE HCL 7.5 MG PO TABS: 7.5000 mg | ORAL_TABLET | Freq: Two times a day (BID) | ORAL | 1 refills | Status: DC

## 2021-10-17 MED ORDER — BUPROPION HCL ER (XL) 150 MG PO TB24
150.0000 mg | ORAL_TABLET | Freq: Every day | ORAL | Status: DC
  Administered 2021-10-17: 10:00:00 150 mg via ORAL
  Filled 2021-10-17: qty 1

## 2021-10-17 MED ORDER — ARIPIPRAZOLE 5 MG PO TABS: 5.0000 mg | ORAL_TABLET | Freq: Every day | ORAL | 1 refills | Status: DC

## 2021-10-17 MED ORDER — BUSPIRONE HCL 15 MG PO TABS
7.5000 mg | ORAL_TABLET | Freq: Two times a day (BID) | ORAL | Status: DC
  Administered 2021-10-17: 10:00:00 7.5 mg via ORAL
  Filled 2021-10-17: qty 1

## 2021-10-17 MED ORDER — TRAZODONE HCL 50 MG PO TABS: 50.0000 mg | ORAL_TABLET | Freq: Every evening | ORAL | 2 refills | Status: DC | PRN

## 2021-10-17 MED ORDER — IBUPROFEN 600 MG PO TABS: 600.0000 mg | ORAL_TABLET | Freq: Four times a day (QID) | ORAL | 0 refills | Status: DC | PRN

## 2021-10-17 MED ORDER — IBUPROFEN 400 MG PO TABS
600.0000 mg | ORAL_TABLET | Freq: Once | ORAL | Status: AC
  Administered 2021-10-17: 10:00:00 600 mg via ORAL
  Filled 2021-10-17: qty 1

## 2021-10-17 MED ORDER — HYDROXYZINE HCL 25 MG PO TABS
25.0000 mg | ORAL_TABLET | Freq: Once | ORAL | Status: AC
  Administered 2021-10-17: 10:00:00 25 mg via ORAL
  Filled 2021-10-17: qty 1

## 2021-10-17 MED ORDER — ARIPIPRAZOLE 10 MG PO TABS
5.0000 mg | ORAL_TABLET | Freq: Once | ORAL | Status: AC
  Administered 2021-10-17: 10:00:00 5 mg via ORAL
  Filled 2021-10-17: qty 1

## 2021-10-17 MED ORDER — BUPROPION HCL ER (XL) 150 MG PO TB24: 150.0000 mg | ORAL_TABLET | ORAL | 1 refills | Status: DC
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
## 2021-10-17 NOTE — ED Notes (Signed)
Reviewed discharge instructions with patient. Follow-up care and medications reviewed. Patient  verbalized understanding. Patient A&Ox4, VSS, and ambulatory with steady gait upon discharge.  °
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
## 2021-10-17 NOTE — ED Notes (Signed)
X1 vitals recheck no response °

## 2021-10-17 NOTE — ED Provider Notes (Signed)
[facility] EMERGENCY DEPARTMENT Provider Note   CSN: [id number] Arrival date & time: 10/17/21  0200     History Chief Complaint  Patient presents with   Asthma   Anxiety    Claire Riley is a 35 y.o. female presenting to the ED with anxiety and chest tightness.  She has a hx of bipolar disorder and GAD, for which she is prescribed abilify, buspar, and atarax, but states she has been out of her medications, although she was told that the prescription is waiting for her at the mental health clinic.  She states that she got into an argument yesterday evening, does not want a provide any further specifics, but reports that she felt some chest tightness and a headache.  She was initially screened in the emergency department about 7 hours ago, and reports that through the course of the night she feels that her symptoms gradually improved, her chest no longer feels as tight, though she continues to have a persistent headache.  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   Generalized anxiety disorder 05/27/2021    Past Surgical History:  Procedure Laterality Date   WISDOM TOOTH EXTRACTION     2007     OB History     Gravida  4   Para  2   Term  2   Preterm      AB  2   Living  2      SAB  2   IAB      Ectopic      Multiple      Live Births  2           Family History  Problem Relation Age of Onset   Hypertension Mother    Scoliosis Mother    Hypertension Father    Diabetes Father    COPD Father    Scoliosis Sister    Diabetes Paternal Grandmother      Social History   Tobacco Use   Smoking status: Some Days    Packs/day: 0.25    Types: Cigarettes   Smokeless tobacco: Never  Vaping Use   Vaping Use: Never used  Substance Use Topics   Alcohol use: No   Drug use: Yes    Types: Marijuana    Comment: last used 2 weeks ago    Home Medications Prior to Admission medications   Medication Sig Start Date End Date Taking? Authorizing Provider  ARIPiprazole (ABILIFY) 5 MG tablet Take 1 tablet (5 mg total) by mouth daily. 10/17/21 10/17/22  Wyvonnia Dusky, MD  buPROPion (WELLBUTRIN XL) 150 MG 24 hr tablet Take 1 tablet (150 mg total) by mouth every morning. 10/17/21 10/17/22  Wyvonnia Dusky, MD  busPIRone (BUSPAR) 7.5 MG tablet Take 1 tablet (7.5 mg total) by mouth 2 (two) times daily. 10/17/21 10/17/22  Wyvonnia Dusky, MD  etonogestrel (NEXPLANON) 68 MG IMPL implant 1 each by Subdermal route once.    [provider]  FLUoxetine (  PROZAC) 20 MG capsule Take 1 capsule (20 mg total) by mouth daily. 10/11/20   Nwoko, Terese Door, PA  hydrOXYzine (ATARAX) 25 MG tablet Take 1 tablet (25 mg total) by mouth 3 (three) times daily as needed for anxiety. 10/17/21   Wyvonnia Dusky, MD  ibuprofen (ADVIL) 600 MG tablet Take 1 tablet (600 mg total) by mouth every 6 (six) hours as needed. 10/17/21   Wyvonnia Dusky, MD  lidocaine (XYLOCAINE) 2 % solution Use as directed 15 mLs in the mouth or throat as needed for mouth pain. 08/14/21   Chase Picket, MD  Melatonin 10 MG TABS Take 10 mg by mouth at bedtime. 10/05/21   Nwoko, Terese Door, PA  Multiple Vitamins-Minerals (MULTIVITAMIN WITH MINERALS) tablet Take 1 tablet by mouth daily.    [provider]  traZODone (DESYREL) 50 MG tablet Take 1 tablet (50 mg total) by mouth at bedtime as needed for sleep. 10/17/21   Wyvonnia Dusky, MD    Allergies    Tomato  Review of Systems   Review of Systems  Constitutional:  Negative for chills and fever.  HENT:  Negative for ear  pain and sore throat.   Eyes:  Negative for pain and visual disturbance.  Respiratory:  Positive for chest tightness and shortness of breath. Negative for cough.   Cardiovascular:  Negative for palpitations.  Gastrointestinal:  Negative for abdominal pain and vomiting.  Genitourinary:  Negative for dysuria and hematuria.  Musculoskeletal:  Negative for arthralgias and back pain.  Skin:  Negative for color change and rash.  Neurological:  Positive for headaches. Negative for seizures, syncope and light-headedness.  All other systems reviewed and are negative.  Physical Exam Updated Vital Signs BP 101/70 (BP Location: Right Arm)    Pulse 85    Temp 97.6 F (36.4 C) (Oral)    Resp 16    Ht 6\' 2"  (1.88 m)    Wt 115.2 kg    SpO2 98%    BMI 32.61 kg/m   Physical Exam Constitutional:      General: She is not in acute distress. HENT:     Head: Normocephalic and atraumatic.  Eyes:     Conjunctiva/sclera: Conjunctivae normal.     Pupils: Pupils are equal, round, and reactive to light.  Cardiovascular:     Rate and Rhythm: Normal rate and regular rhythm.  Pulmonary:     Effort: Pulmonary effort is normal. No respiratory distress.     Breath sounds: Normal breath sounds.  Abdominal:     General: There is no distension.     Tenderness: There is no abdominal tenderness.  Skin:    General: Skin is warm and dry.  Neurological:     General: No focal deficit present.     Mental Status: She is alert and oriented to person, place, and time. Mental status is at baseline.  Psychiatric:        Mood and Affect: Mood normal.        Behavior: Behavior normal.    ED Results / Procedures / Treatments   Labs (all labs ordered are listed, but only abnormal results are displayed) Labs Reviewed  CBC WITH DIFFERENTIAL/PLATELET - Abnormal; Notable for the following components:      Result Value   MCH 24.9 (*)    All other components within normal limits  COMPREHENSIVE METABOLIC PANEL - Abnormal;  Notable for the following components:   Glucose, Bld 104 (*)    All other  components within normal limits  RAPID URINE DRUG SCREEN, HOSP PERFORMED - Abnormal; Notable for the following components:   Cocaine POSITIVE (*)    Tetrahydrocannabinol POSITIVE (*)    All other components within normal limits  SALICYLATE LEVEL - Abnormal; Notable for the following components:   Salicylate Lvl <7.0 (*)    All other components within normal limits  ACETAMINOPHEN LEVEL - Abnormal; Notable for the following components:   Acetaminophen (Tylenol), Serum <10 (*)    All other components within normal limits  RESP PANEL BY RT-PCR (FLU A&B, COVID) ARPGX2  ETHANOL  I-STAT BETA HCG BLOOD, ED ([facility], [facility], [facility] ONLY)    EKG None  Radiology No results found.  Procedures Procedures   Medications Ordered in ED Medications  ibuprofen (ADVIL) tablet 600 mg (600 mg Oral Given 10/17/21 1022)  ARIPiprazole (ABILIFY) tablet 5 mg (5 mg Oral Given 10/17/21 1023)  hydrOXYzine (ATARAX) tablet 25 mg (25 mg Oral Given 10/17/21 1023)    ED Course  I have reviewed the triage vital signs and the nursing notes.  Pertinent labs & imaging results that were available during my care of the patient were reviewed by me and considered in my medical decision making (see chart for details).  Patient presents with chest tightness, history of asthma.  However there is no wheezing on her exam and her oxygen level is excellent.  I suspect is likely related to anxiety, she has a known history of this.  She has been off of her medications and we can restart her morning meds.  She does have prescription waiting for her at the behavioral health clinic, per her report, I strongly encouraged her to pick it up today.  Otherwise she is calm, cooperative, and oriented, does not appear to be responding to internal stimuli, does not report any active hallucinations.  She does not endorse homicidal ideations or suicidal thoughts to me.  I  personally reviewed and interpreted her work-up from triage, with unremarkable lab work.  UDS + for cocaine and THC, which may be contributing to stress levels.  Covid/flu negative.  At this time I do not believe the patient warrants emergent psychiatric hospitalization or IVC.  I discussed with her restarting her medications and then discharging her for outpatient psychiatric follow up, and she says "that sounds a good plan to me."  Final Clinical Impression(s) / ED Diagnoses Final diagnoses:  Chest tightness  Anxiety    Rx / DC Orders ED Discharge Orders          Ordered    ARIPiprazole (ABILIFY) 5 MG tablet  Daily        10/17/21 0937    buPROPion (WELLBUTRIN XL) 150 MG 24 hr tablet  BH-each morning        10/17/21 0937    busPIRone (BUSPAR) 7.5 MG tablet  2 times daily        10/17/21 0937    hydrOXYzine (ATARAX) 25 MG tablet  3 times daily PRN        10/17/21 0937    ibuprofen (ADVIL) 600 MG tablet  Every 6 hours PRN        10/17/21 0937    traZODone (DESYREL) 50 MG tablet  At bedtime PRN        10/17/21 0937             Terald Sleeper, MD 10/17/21 1819

## 2021-10-17 NOTE — Discharge Instructions (Addendum)
I sent a refill of your prescriptions to your pharmacy.  Please call to schedule follow-up appointment with your psychiatrist as soon as possible.

## 2021-10-21 ENCOUNTER — Other Ambulatory Visit: Payer: Self-pay

## 2021-10-21 ENCOUNTER — Encounter (HOSPITAL_BASED_OUTPATIENT_CLINIC_OR_DEPARTMENT_OTHER): Payer: Self-pay | Admitting: Emergency Medicine

## 2021-10-21 ENCOUNTER — Emergency Department (HOSPITAL_BASED_OUTPATIENT_CLINIC_OR_DEPARTMENT_OTHER)
Admission: EM | Admit: 2021-10-21 | Discharge: 2021-10-21 | Disposition: A | Discharge status: Home / Self Care | Payer: Medicaid Other | Attending: Emergency Medicine | Admitting: Emergency Medicine

## 2021-10-21 ENCOUNTER — Emergency Department (HOSPITAL_BASED_OUTPATIENT_CLINIC_OR_DEPARTMENT_OTHER): Payer: Medicaid Other

## 2021-10-21 DIAGNOSIS — Z79899 Other long term (current) drug therapy: Secondary | ICD-10-CM | POA: Diagnosis not present

## 2021-10-21 DIAGNOSIS — Z20822 Contact with and (suspected) exposure to covid-19: Secondary | ICD-10-CM | POA: Insufficient documentation

## 2021-10-21 DIAGNOSIS — F17210 Nicotine dependence, cigarettes, uncomplicated: Secondary | ICD-10-CM | POA: Diagnosis not present

## 2021-10-21 DIAGNOSIS — J101 Influenza due to other identified influenza virus with other respiratory manifestations: Secondary | ICD-10-CM | POA: Insufficient documentation

## 2021-10-21 DIAGNOSIS — J029 Acute pharyngitis, unspecified: Secondary | ICD-10-CM | POA: Diagnosis present

## 2021-10-21 LAB — RESP PANEL BY RT-PCR (FLU A&B, COVID) ARPGX2
Influenza A by PCR: POSITIVE — AB
Influenza B by PCR: NEGATIVE
SARS Coronavirus 2 by RT PCR: NEGATIVE

## 2021-10-21 LAB — GROUP A STREP BY PCR: Group A Strep by PCR: NOT DETECTED

## 2021-10-21 MED ORDER — ACETAMINOPHEN 500 MG PO TABS: 1000.0000 mg | ORAL_TABLET | Freq: Once | ORAL | Status: DC

## 2021-10-21 MED ORDER — ONDANSETRON 4 MG PO TBDP: 4.0000 mg | ORAL_TABLET | Freq: Three times a day (TID) | ORAL | 0 refills | Status: DC | PRN

## 2021-10-21 MED ORDER — PREDNISONE 10 MG PO TABS: 40.0000 mg | ORAL_TABLET | Freq: Every day | ORAL | 0 refills | Status: AC

## 2021-10-21 MED ORDER — OSELTAMIVIR PHOSPHATE 75 MG PO CAPS: 75.0000 mg | ORAL_CAPSULE | Freq: Two times a day (BID) | ORAL | 0 refills | Status: DC

## 2021-10-21 NOTE — ED Provider Notes (Addendum)
[facility] EMERGENCY DEPT Provider Note   CSN: [id number] Arrival date & time: 10/21/21  9563     History Chief Complaint  Patient presents with   Sore Throat    Claire Riley is a 35 y.o. female.   Sore Throat Associated symptoms include shortness of breath.   35 year old female presenting with a medical history significant for bronchial asthma, depression, pneumonia, who presents with generalized body aches, subjective fever, chills, sore throat, mildly productive cough for the past 2 days.  The patient additionally states that she developed diarrhea today.  She has not had COVID or flu yet this viral respiratory season.  She states that symptoms are progressively worsening.  She did feel subjectively warm this morning and took Motrin prior to arrival to the ED.  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   Generalized anxiety disorder 05/27/2021    Past Surgical History:  Procedure Laterality Date   WISDOM TOOTH EXTRACTION     2007     OB History     Gravida  4   Para  2   Term  2   Preterm      AB  2   Living  2      SAB  2   IAB      Ectopic      Multiple      Live Births  2           Family History  Problem Relation Age of Onset   Hypertension Mother    Scoliosis Mother    Hypertension Father    Diabetes Father    COPD Father    Scoliosis Sister    Diabetes Paternal Grandmother     Social History   Tobacco Use   Smoking status: Some Days    Packs/day: 0.25    Types: Cigarettes   Smokeless tobacco: Never  Vaping Use   Vaping Use: Never used  Substance Use Topics   Alcohol use: No   Drug use: Yes     Types: Marijuana    Comment: last used 2 weeks ago    Home Medications Prior to Admission medications   Medication Sig Start Date End Date Taking? Authorizing Provider  ondansetron (ZOFRAN-ODT) 4 MG disintegrating tablet Take 1 tablet (4 mg total) by mouth every 8 (eight) hours as needed for nausea or vomiting. 10/21/21  Yes Ernie Avena, MD  oseltamivir (TAMIFLU) 75 MG capsule Take 1 capsule (75 mg total) by mouth every 12 (twelve) hours. 10/21/21  Yes Ernie Avena, MD  predniSONE (DELTASONE) 10 MG tablet Take 4 tablets (40 mg total) by mouth daily for 5 days. 10/21/21 10/26/21 Yes Ernie Avena, MD  ARIPiprazole (ABILIFY) 5 MG tablet Take 1 tablet (5 mg total) by mouth daily. 10/17/21 10/17/22  Terald Sleeper, MD  buPROPion (WELLBUTRIN XL) 150 MG 24 hr tablet Take 1 tablet (150 mg total) by mouth every morning. 10/17/21 10/17/22  Terald Sleeper, MD  busPIRone (BUSPAR) 7.5 MG tablet Take 1 tablet (7.5 mg total) by mouth 2 (two) times daily. 10/17/21 10/17/22  Alvester Chou  J, MD  etonogestrel (NEXPLANON) 68 MG IMPL implant 1 each by Subdermal route once.    [provider]  FLUoxetine (PROZAC) 20 MG capsule Take 1 capsule (20 mg total) by mouth daily. 10/11/20   Nwoko, Tommas Olp, PA  hydrOXYzine (ATARAX) 25 MG tablet Take 1 tablet (25 mg total) by mouth 3 (three) times daily as needed for anxiety. 10/17/21   Terald Sleeper, MD  ibuprofen (ADVIL) 600 MG tablet Take 1 tablet (600 mg total) by mouth every 6 (six) hours as needed. 10/17/21   Terald Sleeper, MD  lidocaine (XYLOCAINE) 2 % solution Use as directed 15 mLs in the mouth or throat as needed for mouth pain. 08/14/21   Merrilee Jansky, MD  Melatonin 10 MG TABS Take 10 mg by mouth at bedtime. 10/05/21   Nwoko, Tommas Olp, PA  Multiple Vitamins-Minerals (MULTIVITAMIN WITH MINERALS) tablet Take 1 tablet by mouth daily.    [provider]  traZODone (DESYREL) 50 MG tablet Take 1 tablet (50 mg total) by  mouth at bedtime as needed for sleep. 10/17/21   Terald Sleeper, MD    Allergies    Tomato  Review of Systems   Review of Systems  Constitutional:  Positive for chills and fever.  Respiratory:  Positive for cough and shortness of breath.   Gastrointestinal:  Positive for diarrhea and nausea. Negative for vomiting.  Musculoskeletal:  Positive for myalgias.  All other systems reviewed and are negative.  Physical Exam Updated Vital Signs BP 124/80    Pulse (!) 106    Temp 98.4 F (36.9 C) (Oral)    Resp 20    SpO2 100%   Physical Exam Vitals and nursing note reviewed.  Constitutional:      General: She is not in acute distress.    Appearance: She is well-developed. She is ill-appearing.  HENT:     Head: Normocephalic and atraumatic.  Eyes:     Conjunctiva/sclera: Conjunctivae normal.     Pupils: Pupils are equal, round, and reactive to light.  Cardiovascular:     Rate and Rhythm: Normal rate and regular rhythm.     Heart sounds: No murmur heard. Pulmonary:     Effort: Pulmonary effort is normal. No respiratory distress.     Breath sounds: Normal breath sounds.  Abdominal:     General: There is no distension.     Palpations: Abdomen is soft.     Tenderness: There is no abdominal tenderness. There is no guarding.  Musculoskeletal:        General: No swelling, deformity or signs of injury.     Cervical back: Neck supple.  Skin:    General: Skin is warm and dry.     Capillary Refill: Capillary refill takes less than 2 seconds.     Findings: No lesion or rash.  Neurological:     General: No focal deficit present.     Mental Status: She is alert. Mental status is at baseline.  Psychiatric:        Mood and Affect: Mood normal.    ED Results / Procedures / Treatments   Labs (all labs ordered are listed, but only abnormal results are displayed) Labs Reviewed  RESP PANEL BY RT-PCR (FLU A&B, COVID) ARPGX2 - Abnormal; Notable for the following components:      Result  Value   Influenza A by PCR POSITIVE (*)    All other components within normal limits  GROUP A STREP BY PCR  EKG None  Radiology DG Chest Portable 1 View  Result Date: 10/21/2021 CLINICAL DATA:  Cough, fever and chills EXAM: PORTABLE CHEST 1 VIEW COMPARISON:  05/20/2021 FINDINGS: Heart and mediastinal shadows are normal. There may be central bronchial thickening but there is no infiltrate, collapse or effusion. No abnormal bone finding. IMPRESSION: Possible bronchitis.  No consolidation or collapse. Electronically Signed   By: Paulina Fusi M.D.   On: 10/21/2021 11:05    Procedures Procedures   Medications Ordered in ED Medications  acetaminophen (TYLENOL) tablet 1,000 mg (has no administration in time range)    ED Course  I have reviewed the triage vital signs and the nursing notes.  Pertinent labs & imaging results that were available during my care of the patient were reviewed by me and considered in my medical decision making (see chart for details).  Clinical Course as of 10/21/21 1112  Sat Oct 21, 2021  1047 Influenza A By PCR(!): POSITIVE [JL]    Clinical Course User Index [JL] Ernie Avena, MD   MDM Rules/Calculators/A&P                          35 year old female presenting with a medical history significant for bronchial asthma, depression, pneumonia, who presents with generalized body aches, subjective fever, chills, sore throat, mildly productive cough for the past 2 days.  The patient additionally states that she developed diarrhea today.  She has not had COVID or flu yet this viral respiratory season.  She states that symptoms are progressively worsening.  She did feel subjectively warm this morning and took Motrin prior to arrival to the ED.  Patient had COVID and influenza PCR testing was resulted positive for influenza.  She did request Tamiflu and is on day 3 so we will treat.  Chest x-ray performed which revealed no focal consolidation to suggest bacterial  pneumonia, possible current bronchitis present which is likely reactive airway inflammation in the setting of the patient's influenza infection.  Given her nausea we will also treat with a course of Zofran.  Patient is overall well-appearing, saturating well on room air, mild tachycardia noted in the setting of her influenza infection.  Appears well-hydrated.  Tolerating oral intake.  Not hypoxic or tachypneic.  The patient was advised continued oral hydration, Tylenol and ibuprofen for fever and muscle aches, rest at home, outpatient follow-up as needed.  Return precautions provided.   Final Clinical Impression(s) / ED Diagnoses Final diagnoses:  Influenza A    Rx / DC Orders ED Discharge Orders          Ordered    oseltamivir (TAMIFLU) 75 MG capsule  Every 12 hours        10/21/21 1111    ondansetron (ZOFRAN-ODT) 4 MG disintegrating tablet  Every 8 hours PRN        10/21/21 1111    predniSONE (DELTASONE) 10 MG tablet  Daily        10/21/21 1112             Ernie Avena, MD 10/21/21 1112    Ernie Avena, MD 10/21/21 1113

## 2021-10-21 NOTE — Discharge Instructions (Addendum)
You were evaluated in the Emergency Department and after careful evaluation, we did not find any emergent condition requiring admission or further testing in the hospital.  Your exam/testing today was overall reassuring.  Your chest x-ray did not reveal evidence of a bacterial pneumonia, did show airway inflammation consistent with bronchitis in the setting of your likely influenza infection.  We will treat with Tamiflu, short steroid burst of prednisone and Zofran for nausea. Utilize your home inhaler for wheezing.  Please return to the Emergency Department if you experience any worsening of your condition.  Thank you for allowing [place] to be a part of your care.

## 2021-10-21 NOTE — ED Triage Notes (Signed)
Pt reports generalized achiness, subjective fever, chills, sore throat for 2 days, worsening today.

## 2021-12-06 ENCOUNTER — Telehealth (HOSPITAL_COMMUNITY): Payer: Medicaid Other | Admitting: Physician Assistant

## 2021-12-07 ENCOUNTER — Encounter (HOSPITAL_COMMUNITY): Payer: Self-pay | Admitting: Physician Assistant

## 2021-12-07 ENCOUNTER — Telehealth (INDEPENDENT_AMBULATORY_CARE_PROVIDER_SITE_OTHER): Payer: Medicaid Other | Admitting: Physician Assistant

## 2021-12-07 DIAGNOSIS — F319 Bipolar disorder, unspecified: Secondary | ICD-10-CM | POA: Diagnosis not present

## 2021-12-07 DIAGNOSIS — F411 Generalized anxiety disorder: Secondary | ICD-10-CM | POA: Diagnosis not present

## 2021-12-07 NOTE — Progress Notes (Signed)
[place] MD/Claire Riley/NP OP Progress Note  Virtual Visit via Telephone Note  I connected with Claire Riley on 12/07/21 at  4:00 PM EST by telephone and verified that I am speaking with the correct person using two identifiers.  Location: Patient: Home Provider: Clinic   I discussed the limitations, risks, security and privacy concerns of performing an evaluation and management service by telephone and the availability of in person appointments. I also discussed with the patient that there may be a patient responsible charge related to this service. The patient expressed understanding and agreed to proceed.  Follow Up Instructions:  I discussed the assessment and treatment plan with the patient. The patient was provided an opportunity to ask questions and all were answered. The patient agreed with the plan and demonstrated an understanding of the instructions.   The patient was advised to call back or seek an in-person evaluation if the symptoms worsen or if the condition fails to improve as anticipated.  I provided 13 minutes of non-face-to-face time during this encounter.  Claire Mood, Claire Riley   12/07/2021 11:26 PM Claire Riley  MRN:  [id number]  Chief Complaint: Follow up and medication management  HPI:   Claire Riley is a 36 year old female with a past psychiatric history significant for bipolar depression and generalized anxiety disorder who presents to [facility] via virtual telephone visit for follow-up and medication management.  Patient is currently being managed on the following medications:  Abilify 5 mg daily Bupropion (Wellbutrin XL) 300 mg 24-hour tablet daily Melatonin 10 mg at bedtime Buspirone 7.5 mg 2 times daily  Patient reports that she has been experiencing some Riley swings and irritability every now and then.  Patient states that she experiences depression when things are not going her way.  Patient endorses the following  depressive symptoms: difficulty getting out of bed, hypersomnia, lack of motivation, and decreased energy.  Patient states that her anxiety has been minimal and reports that her only stressor is finding section 8 housing.  Patient states that she has recently received a voucher to obtain section 8 housing.  A PHQ-9 screen was performed with the patient scoring a 16.  A GAD-7 screen was also performed with the patient scoring a 19.  Patient is alert and oriented x4, calm, cooperative, and fully engaged in conversation during the encounter.  Patient states that she is doing all right.  Patient denies suicidal or homicidal ideations.  She endorses auditory hallucinations characterized by negative thoughts of hatred towards herself such as "the world would be better off without you."  She states that her negative thoughts are often accompanied by underlying laughter.  Patient denies visual hallucinations and does not appear to be responding to internal/external stimuli.  Patient endorses fair sleep and receives on average 5 to 6 hours of sleep each night.  Patient states that she has been experiencing weird dreams when sleeping.  Patient endorses good appetite and eats on average 2-3 meals per day.  Patient denies alcohol consumption.  Patient endorses tobacco use and smokes on average 7 to 8 cigarettes/day.  Patient endorses illicit drug use in the form of marijuana.  Visit Diagnosis:    ICD-10-CM   1. Bipolar depression ([place])  F31.9     2. Generalized anxiety disorder  F41.1       Past Psychiatric History:  Bipolar disorder Generalized anxiety disorder Insomnia ADHD Dissociative Identity Disorder - dx at 3  Past Medical History:  Past Medical History:  Diagnosis Date   Anemia    Bronchial asthma    last attack 64yrs ago   Depression    Gonorrhea    Headache(784.0)    Pneumonia    Trichomonas    UTI (lower urinary tract infection)     Past Surgical History:  Procedure Laterality Date    WISDOM TOOTH EXTRACTION     2007    Family Psychiatric History:  Paternal Aunt - Bipolar Depression/Schizophrenia Paternal Uncle - PTSD, Personality Disorder Mother - Depression Sisters - Anxiety, Insomnia, Depression  Family History:  Family History  Problem Relation Age of Onset   Hypertension Mother    Scoliosis Mother    Hypertension Father    Diabetes Father    COPD Father    Scoliosis Sister    Diabetes Paternal Grandmother     Social History:  Social History   Socioeconomic History   Marital status: Married    Spouse name: Not on file   Number of children: Not on file   Years of education: Not on file   Highest education level: Not on file  Occupational History    Employer: [employer]    Comment: [employer]  Tobacco Use   Smoking status: Some Days    Packs/day: 0.25    Types: Cigarettes   Smokeless tobacco: Never  Vaping Use   Vaping Use: Never used  Substance and Sexual Activity   Alcohol use: No   Drug use: Yes    Types: Marijuana    Comment: last used 2 weeks ago   Sexual activity: Not Currently    Comment: Last intercourse 2 days ago  Other Topics Concern   Not on file  Social History Narrative   Not on file   Social Determinants of Health   Financial Resource Strain: Not on file  Food Insecurity: Not on file  Transportation Needs: Not on file  Physical Activity: Not on file  Stress: Not on file  Social Connections: Not on file    Allergies:  Allergies  Allergen Reactions   Tomato Hives    Metabolic Disorder Labs: Lab Results  Component Value Date   HGBA1C 5.3 08/17/2020   MPG 105 08/17/2020   No results found for: PROLACTIN Lab Results  Component Value Date   CHOL 154 08/17/2020   TRIG 77 08/17/2020   HDL 24 (L) 08/17/2020   CHOLHDL 6.4 08/17/2020   VLDL 15 08/17/2020   LDLCALC 115 (H) 08/17/2020   Lab Results  Component Value Date   TSH 0.699 08/17/2020    Therapeutic Level Labs: No results found  for: LITHIUM No results found for: VALPROATE No components found for:  CBMZ  Current Medications: Current Outpatient Medications  Medication Sig Dispense Refill   ARIPiprazole (ABILIFY) 5 MG tablet Take 1 tablet (5 mg total) by mouth daily. 30 tablet 1   buPROPion (WELLBUTRIN XL) 150 MG 24 hr tablet Take 1 tablet (150 mg total) by mouth every morning. 30 tablet 1   busPIRone (BUSPAR) 7.5 MG tablet Take 1 tablet (7.5 mg total) by mouth 2 (two) times daily. 60 tablet 1   etonogestrel (NEXPLANON) 68 MG IMPL implant 1 each by Subdermal route once.     FLUoxetine (PROZAC) 20 MG capsule Take 1 capsule (20 mg total) by mouth daily. 30 capsule 2   hydrOXYzine (ATARAX) 25 MG tablet Take 1 tablet (25 mg total) by mouth 3 (three) times daily as needed for anxiety. 90 tablet 1   ibuprofen (ADVIL) 600 MG  tablet Take 1 tablet (600 mg total) by mouth every 6 (six) hours as needed. 30 tablet 0   lidocaine (XYLOCAINE) 2 % solution Use as directed 15 mLs in the mouth or throat as needed for mouth pain. 100 mL 0   Melatonin 10 MG TABS Take 10 mg by mouth at bedtime. 30 tablet 1   Multiple Vitamins-Minerals (MULTIVITAMIN WITH MINERALS) tablet Take 1 tablet by mouth daily.     ondansetron (ZOFRAN-ODT) 4 MG disintegrating tablet Take 1 tablet (4 mg total) by mouth every 8 (eight) hours as needed for nausea or vomiting. 20 tablet 0   oseltamivir (TAMIFLU) 75 MG capsule Take 1 capsule (75 mg total) by mouth every 12 (twelve) hours. 10 capsule 0   traZODone (DESYREL) 50 MG tablet Take 1 tablet (50 mg total) by mouth at bedtime as needed for sleep. 30 tablet 2   No current facility-administered medications for this visit.     Musculoskeletal: Strength & Muscle Tone: Unable to assess due to telemedicine visit [place]: Unable to assess due to telemedicine visit Patient leans: Unable to assess due to telemedicine visit  Psychiatric Specialty Exam: Review of Systems  Psychiatric/Behavioral:  Positive for  dysphoric Riley, hallucinations and sleep disturbance. Negative for decreased concentration, self-injury and suicidal ideas. The patient is not nervous/anxious and is not hyperactive.    There were no vitals taken for this visit.There is no height or weight on file to calculate BMI.  General Appearance: Unable to assess due to telemedicine visit  Eye Contact:  Unable to assess due to telemedicine visit  Speech:  Clear and Coherent and Normal Rate  Volume:  Normal  Riley:  Depressed and Dysphoric  Affect:  Congruent and Depressed  Thought Process:  Coherent, Goal Directed, and Descriptions of Associations: Intact  Orientation:  Full (Time, Place, and Person)  Thought Content: WDL   Suicidal Thoughts:  No  Homicidal Thoughts:  No  Memory:  Immediate;   Good Recent;   Good Remote;   Good  Judgement:  Good  Insight:  Fair  Psychomotor Activity:  Normal  Concentration:  Concentration: Good and Attention Span: Good  Recall:  Good  Fund of Knowledge: Good  Language: Good  Akathisia:  No  Handed:  Right  AIMS (if indicated): not done  Assets:  Communication Skills Desire for Improvement Social Support  ADL's:  Intact  Cognition: WNL  Sleep:  Fair   Screenings: GAD-7    Flowsheet Row Video Visit from 12/07/2021 in [facility] Video Visit from 10/04/2021 in [facility] Video Visit from 08/03/2021 in [facility] Video Visit from 05/26/2021 in [facility]  Total GAD-7 Score 19 21 14 19       PHQ2-9    Flowsheet Row Video Visit from 12/07/2021 in [facility] Video Visit from 10/04/2021 in [facility] Video Visit from 08/03/2021 in [facility] Video Visit from 05/26/2021 in [facility]  PHQ-2 Total Score 5 1 3 4   PHQ-9 Total Score 16 -- 12 21      Flowsheet Row Video  Visit from 12/07/2021 in [facility] ED from 10/17/2021 in [place] Video Visit from 10/04/2021 in [place] Moderate Risk Error: Q7 should not be populated when Q6 is No Low Risk  Assessment and Plan:   Berda Coronel is a 36 year old female with a past psychiatric history significant for bipolar depression and generalized anxiety disorder who presents to [facility] via virtual telephone visit for follow-up and medication management.  Patient reports that she has been experiencing some Riley swings and irritability as well as depressive symptoms.  Patient states that her symptoms appear to have started since being placed on Wellbutrin.  Provider to take patient off Wellbutrin and to increase patient's Abilify dosage from 5 mg to 10 mg daily for stabilization and management of her bipolar depression.  Patient is agreeable to recommendation.  Patient's medications to be e-prescribed to pharmacy of choice.  1. Bipolar depression ([place])  - ARIPiprazole (ABILIFY) 10 MG tablet; Take 1 tablet (10 mg total) by mouth daily.  Dispense: 30 tablet; Refill: 1  2. Generalized anxiety disorder  - busPIRone (BUSPAR) 7.5 MG tablet; Take 1 tablet (7.5 mg total) by mouth 2 (two) times daily.  Dispense: 60 tablet; Refill: 1  Patient to follow up in 2 months Provider spent a total of 13 minutes with the patient/reviewing patient's chart  Claire Mood, Claire Riley 12/07/2021, 11:26 PM

## 2021-12-08 MED ORDER — BUSPIRONE HCL 7.5 MG PO TABS: 7.5000 mg | ORAL_TABLET | Freq: Two times a day (BID) | ORAL | 1 refills | Status: DC

## 2021-12-08 MED ORDER — ARIPIPRAZOLE 10 MG PO TABS: 10.0000 mg | ORAL_TABLET | Freq: Every day | ORAL | 1 refills | Status: DC

## 2021-12-15 ENCOUNTER — Ambulatory Visit (INDEPENDENT_AMBULATORY_CARE_PROVIDER_SITE_OTHER): Payer: Medicaid Other | Admitting: Family Medicine

## 2021-12-15 ENCOUNTER — Other Ambulatory Visit: Payer: Self-pay

## 2021-12-15 DIAGNOSIS — Z5941 Food insecurity: Secondary | ICD-10-CM

## 2021-12-15 DIAGNOSIS — R350 Frequency of micturition: Secondary | ICD-10-CM

## 2021-12-15 DIAGNOSIS — N926 Irregular menstruation, unspecified: Secondary | ICD-10-CM | POA: Diagnosis not present

## 2021-12-15 DIAGNOSIS — Z3169 Encounter for other general counseling and advice on procreation: Secondary | ICD-10-CM | POA: Diagnosis not present

## 2021-12-15 LAB — POCT URINALYSIS DIP (DEVICE)
Bilirubin Urine: NEGATIVE
Glucose, UA: NEGATIVE mg/dL
Hgb urine dipstick: NEGATIVE
Ketones, ur: NEGATIVE mg/dL
Nitrite: NEGATIVE
Protein, ur: NEGATIVE mg/dL
Specific Gravity, Urine: >=1.03 (ref 1.005–1.030)
Urobilinogen, UA: 0.2 mg/dL (ref 0.0–1.0)
pH: 6 (ref 5.0–8.0)

## 2021-12-15 MED ORDER — PRENATAL/FOLIC ACID+DHA 27-0.8-200 MG PO CAPS: 1.0000 | ORAL_CAPSULE | Freq: Every day | ORAL | 5 refills | Status: DC

## 2021-12-15 NOTE — Progress Notes (Signed)
GYNECOLOGY OFFICE VISIT NOTE  History:   Claire Riley is a 36 y.o. [phone] here today to discuss irregular menses and fertility.   - Has 2 children, last in 2014, both vaginal deliveries without complication - Reports she has been trying for several months with current partner to get pregnancy without success  - Current partner is not the father of her other two children - She has tracked her period on an app, but she has not been timing intercourse to be within fertile window - Thinks that her periods are irregular because they are not happening on the same day every month - Does have a period every month, just not on the same days  - Denies taking any medicines regularly - Does take Abilify and Buspar when her mood symptoms flare, but otherwise she does not take these - She is not taking a prenatal vitamin  - She denies any hx of abdominal/pelvic surgery  - Has noticed that she has gained about 20 lbs in the last couple of years  - Reports her periods last for 3-4 days on average, heavy flow - Does not describe her periods as painful  - Denies any pelvic pain or vaginal discharge today, has had some urinary frequency  - No other concerns at this time   Past Medical History:  Diagnosis Date   Anemia    Bronchial asthma    last attack 40yrs ago   Depression    Gonorrhea    Headache(784.0)    Pneumonia    Trichomonas    UTI (lower urinary tract infection)     Past Surgical History:  Procedure Laterality Date   WISDOM TOOTH EXTRACTION     2007   The following portions of the patient's history were reviewed and updated as appropriate: allergies, current medications, past family history, past medical history, past social history, past surgical history and problem list.   Health Maintenance:  Normal pap in 2014, due for repeat.   Review of Systems:  Pertinent items noted in HPI and remainder of comprehensive ROS otherwise negative.  Physical Exam:  BP 116/79    Pulse 93     Wt 252 lb 14.4 oz (114.7 kg)    LMP 11/23/2021 (Exact Date) Comment: Patient states periods are very irregular.   BMI 32.47 kg/m   CONSTITUTIONAL: Well-developed, well-nourished female in no acute distress.  HEENT:  Normocephalic, atraumatic. EOMI, conjunctivae clear. CARDIOVASCULAR: Normal heart rate noted. RESPIRATORY: Normal work of breathing on room air.  SKIN: No rashes or lesions noted. MUSCULOSKELETAL: Normal range of motion.  NEUROLOGIC: Alert and oriented to person, place, and time. No focal deficit noted.  PSYCHIATRIC: Normal mood and affect. Normal behavior. Normal judgment and thought content.  Labs and Imaging Results for orders placed or performed in visit on 12/15/21 (from the past 168 hour(s))  POCT urinalysis dip (device)   Collection Time: 12/15/21 10:52 AM  Result Value Ref Range   Glucose, UA NEGATIVE NEGATIVE mg/dL   Bilirubin Urine NEGATIVE NEGATIVE   Ketones, ur NEGATIVE NEGATIVE mg/dL   Specific Gravity, Urine >=1.030 1.005 - 1.030   Hgb urine dipstick NEGATIVE NEGATIVE   pH 6.0 5.0 - 8.0   Protein, ur NEGATIVE NEGATIVE mg/dL   Urobilinogen, UA 0.2 0.0 - 1.0 mg/dL   Nitrite NEGATIVE NEGATIVE   Leukocytes,Ua TRACE (A) NEGATIVE   Assessment and Plan:   1. Encounter for preconception consultation 2. Irregular menses Counseled at length during visit today. Discussed that her periods very  well may be regular if they are occurring every month. Reviewed different types of cycle lengths and how this can change when periods occur during the month. Additionally reviewed factors that impact fertility. Patient has had two vaginal deliveries prior with different partner. Reviewed that fertility issues can sometimes originate from female partner. Given that patient is not actively trying to have intercourse in fertile window, discussed that she may be missing ovulation time as well. Recommended monitoring of her menses with app tracker for the next 2-3 months. Prenatal  vitamin prescribed. Discussed importance of healthy diet and exercise to prevent anovulatory cycles due to obesity. TSH and HgbA1c in 2021 normal. Recommended that she try to have intercourse during predicted ovulation time based on app for the time being. Will plan to reassess in 3 months to further evaluate whether additional fertility testing is needed at that time.  - Prenatal MV-Min-Fe Fum-FA-DHA (PRENATAL/FOLIC ACID+DHA) [id number] MG CAPS; Take 1 tablet by mouth daily.  Dispense: 30 capsule; Refill: 5  3. Food insecurity Referred to food market today.  - AMBULATORY REFERRAL TO [facility]  4. Urinary frequency UA noninfectious appearing. Unlikely UTI. Return if symptoms worsen of fail to improve.   Routine preventative health maintenance measures emphasized. Plan to complete pap smear at next visit.   Please refer to After Visit Summary for other counseling recommendations.   Return in about 3 months (around 03/14/2022) for for follow up visit.    Vilma Meckel, MD OB Fellow, [place] for [facility]

## 2021-12-17 ENCOUNTER — Encounter: Payer: Self-pay | Admitting: Family Medicine

## 2022-02-01 ENCOUNTER — Encounter (HOSPITAL_COMMUNITY): Payer: Self-pay

## 2022-02-01 ENCOUNTER — Telehealth (HOSPITAL_COMMUNITY): Payer: No Typology Code available for payment source | Admitting: Physician Assistant

## 2022-03-20 ENCOUNTER — Ambulatory Visit: Payer: Medicaid Other | Admitting: Family Medicine

## 2022-06-19 ENCOUNTER — Ambulatory Visit: Payer: Medicaid Other | Admitting: Medical

## 2022-06-26 ENCOUNTER — Telehealth (INDEPENDENT_AMBULATORY_CARE_PROVIDER_SITE_OTHER): Payer: Medicaid Other | Admitting: Family

## 2022-06-26 ENCOUNTER — Encounter (HOSPITAL_COMMUNITY): Payer: Self-pay | Admitting: Family

## 2022-06-26 DIAGNOSIS — F319 Bipolar disorder, unspecified: Secondary | ICD-10-CM

## 2022-06-26 DIAGNOSIS — F411 Generalized anxiety disorder: Secondary | ICD-10-CM | POA: Diagnosis not present

## 2022-06-26 MED ORDER — ARIPIPRAZOLE 10 MG PO TABS: 10.0000 mg | ORAL_TABLET | Freq: Every day | ORAL | 1 refills | Status: DC

## 2022-06-26 MED ORDER — BUPROPION HCL ER (XL) 150 MG PO TB24: 150.0000 mg | ORAL_TABLET | ORAL | 1 refills | Status: DC

## 2022-06-26 MED ORDER — BUSPIRONE HCL 7.5 MG PO TABS: 7.5000 mg | ORAL_TABLET | Freq: Two times a day (BID) | ORAL | 1 refills | Status: DC

## 2022-06-26 NOTE — Progress Notes (Signed)
Virtual Visit via Telephone Note  I connected with Bonnee Zertuche on 06/26/22 at 10:30 AM EDT by telephone and verified that I am speaking with the correct person using two identifiers.  Location: Patient: Home Provider: Office    I discussed the limitations, risks, security and privacy concerns of performing an evaluation and management service by telephone and the availability of in person appointments. I also discussed with the patient that there may be a patient responsible charge related to this service. The patient expressed understanding and agreed to proceed.  I discussed the assessment and treatment plan with the patient. The patient was provided an opportunity to ask questions and all were answered. The patient agreed with the plan and demonstrated an understanding of the instructions.   The patient was advised to call back or seek an in-person evaluation if the symptoms worsen or if the condition fails to improve as anticipated.  I provided 15 minutes of non-face-to-face time during this encounter.   Oneta Rack, NP   [facility] MD/PA/NP OP Progress Note  06/26/2022 10:48 AM Trude Cansler  MRN:  [id number]  Chief Complaint: Gabriel Rung reported " I am doing okay."   Evaluation:Angeligue Christell Constant is a 36 year old female seen and evaluated via telephonically.  Denied any concerns at this visit.  She has a charted history with generalized anxiety disorder, major depressive disorder and bipolar depression disorder.    She is currently prescribed Abilify, Wellbutrin, BuSpar and reports taking melatonin 10 mg for sleep.  States she is doing well with this medication combination currently.  Denied illicit drug use or substance abuse history.  Currently rating her depression and anxiety 6 out of 10 with 10 being the worst.    Reports a good appetite.  States she continues to have struggles going to sleep.  Discussed using hydroxyzine to help with reported symptoms.  She was receptive to plan.  Bambi is denying suicidal or homicidal ideations.  Denies auditory visual hallucinations.   Medication management:  Continue Wellbutrin XL 150 mg daily Continue BuSpar 7.5 mg p.o. twice daily Continue Abilify 10 mg p.o. daily -Consider reinitiating hydroxyzine 25 mg as needed for reported sleep issues   During evaluation Wanona Kunsman did not sound as if she was in any acute distress. She is alert/oriented x 4; calm/cooperative, reported she is currently at work.  Stated she is employed by [employer].She is speaking in a clear tone at moderate volume, and normal pace.her thought process is coherent and relevant.  She denied that she is responding indication to internal/external stimuli or experiencing delusional thought content; and she has denied suicidal/self-harm/homicidal ideation, psychosis, and paranoia.  Patient has remained calm throughout assessment and has answered questions appropriately.       Visit Diagnosis:    ICD-10-CM   1. Generalized anxiety disorder  F41.1     2. Bipolar depression (HCC)  F31.9       Past Psychiatric History:   Past Medical History:  Past Medical History:  Diagnosis Date   Anemia    Bronchial asthma    last attack 54yrs ago   Depression    Gonorrhea    Headache(784.0)    Pneumonia    Trichomonas    UTI (lower urinary tract infection)     Past Surgical History:  Procedure Laterality Date   WISDOM TOOTH EXTRACTION     2007    Family Psychiatric History:   Family History:  Family History  Problem Relation Age of Onset   Hypertension Mother  Scoliosis Mother    Hypertension Father    Diabetes Father    COPD Father    Scoliosis Sister    Diabetes Paternal Grandmother     Social History:  Social History   Socioeconomic History   Marital status: Married    Spouse name: Not on file   Number of children: Not on file   Years of education: Not on file   Highest education level: Not on file  Occupational History     Employer: [employer]    Comment: [employer]  Tobacco Use   Smoking status: Some Days    Packs/day: 0.25    Types: Cigarettes   Smokeless tobacco: Never  Vaping Use   Vaping Use: Never used  Substance and Sexual Activity   Alcohol use: No   Drug use: Yes    Types: Marijuana    Comment: last used 2 weeks ago   Sexual activity: Not Currently    Comment: Last intercourse 2 days ago  Other Topics Concern   Not on file  Social History Narrative   Not on file   Social Determinants of Health   Financial Resource Strain: Not on file  Food Insecurity: Food Insecurity Present (12/15/2021)   Hunger Vital Sign    Worried About Running Out of Food in the Last Year: Sometimes true    Ran Out of Food in the Last Year: Sometimes true  Transportation Needs: Unmet Transportation Needs (12/15/2021)   PRAPARE - Administrator, Civil Service (Medical): Yes    Lack of Transportation (Non-Medical): Yes  Physical Activity: Not on file  Stress: Not on file  Social Connections: Not on file    Allergies:  Allergies  Allergen Reactions   Tomato Hives    Metabolic Disorder Labs: Lab Results  Component Value Date   HGBA1C 5.3 08/17/2020   MPG 105 08/17/2020   No results found for: "PROLACTIN" Lab Results  Component Value Date   CHOL 154 08/17/2020   TRIG 77 08/17/2020   HDL 24 (L) 08/17/2020   CHOLHDL 6.4 08/17/2020   VLDL 15 08/17/2020   LDLCALC 115 (H) 08/17/2020   Lab Results  Component Value Date   TSH 0.699 08/17/2020    Therapeutic Level Labs: No results found for: "LITHIUM" No results found for: "VALPROATE" No results found for: "CBMZ"  Current Medications: Current Outpatient Medications  Medication Sig Dispense Refill   ARIPiprazole (ABILIFY) 10 MG tablet Take 1 tablet (10 mg total) by mouth daily. 30 tablet 1   buPROPion (WELLBUTRIN XL) 150 MG 24 hr tablet Take 1 tablet (150 mg total) by mouth every morning. 30 tablet 1   busPIRone (BUSPAR)  7.5 MG tablet Take 1 tablet (7.5 mg total) by mouth 2 (two) times daily. 60 tablet 1   etonogestrel (NEXPLANON) 68 MG IMPL implant 1 each by Subdermal route once. (Patient not taking: Reported on 12/15/2021)     FLUoxetine (PROZAC) 20 MG capsule Take 1 capsule (20 mg total) by mouth daily. (Patient not taking: Reported on 12/15/2021) 30 capsule 2   hydrOXYzine (ATARAX) 25 MG tablet Take 1 tablet (25 mg total) by mouth 3 (three) times daily as needed for anxiety. (Patient not taking: Reported on 12/15/2021) 90 tablet 1   ibuprofen (ADVIL) 600 MG tablet Take 1 tablet (600 mg total) by mouth every 6 (six) hours as needed. (Patient not taking: Reported on 12/15/2021) 30 tablet 0   lidocaine (XYLOCAINE) 2 % solution Use as directed 15 mLs in  the mouth or throat as needed for mouth pain. 100 mL 0   Melatonin 10 MG TABS Take 10 mg by mouth at bedtime. 30 tablet 1   Multiple Vitamins-Minerals (MULTIVITAMIN WITH MINERALS) tablet Take 1 tablet by mouth daily.     ondansetron (ZOFRAN-ODT) 4 MG disintegrating tablet Take 1 tablet (4 mg total) by mouth every 8 (eight) hours as needed for nausea or vomiting. (Patient not taking: Reported on 12/15/2021) 20 tablet 0   oseltamivir (TAMIFLU) 75 MG capsule Take 1 capsule (75 mg total) by mouth every 12 (twelve) hours. (Patient not taking: Reported on 12/15/2021) 10 capsule 0   Prenatal MV-Min-Fe Fum-FA-DHA (PRENATAL/FOLIC ACID+DHA) 27-0.8-200 MG CAPS Take 1 tablet by mouth daily. 30 capsule 5   traZODone (DESYREL) 50 MG tablet Take 1 tablet (50 mg total) by mouth at bedtime as needed for sleep. (Patient not taking: Reported on 12/15/2021) 30 tablet 2   No current facility-administered medications for this visit.     Musculoskeletal:   Psychiatric Specialty Exam: Review of Systems  There were no vitals taken for this visit.There is no height or weight on file to calculate BMI.  General Appearance: NA  Eye Contact:  NA  Speech:  Clear and Coherent  Volume:  Normal   Mood:  Anxious  Affect:  Congruent  Thought Process:  Coherent  Orientation:  Full (Time, Place, and Person)  Thought Content: Logical   Suicidal Thoughts:  No  Homicidal Thoughts:  No  Memory:  Immediate;   Good Recent;   Good  Judgement:  Fair  Insight:  Good  Psychomotor Activity:  NA  Concentration:  Concentration: Good  Recall:  Good  Fund of Knowledge: Good  Language: Good  Akathisia:  NA  Handed:  Right  AIMS (if indicated): not done  Assets:  Desire for Improvement Resilience Social Support  ADL's:  Intact  Cognition: WNL  Sleep:  Fair   Screenings: GAD-7    Flowsheet Row Office Visit from 12/15/2021 in [facility] Video Visit from 12/07/2021 in [facility] Video Visit from 10/04/2021 in [facility] Video Visit from 08/03/2021 in [facility] Visit from 05/26/2021 in [facility]  Total GAD-7 Score 17 19 21 14 19       PHQ2-9    Flowsheet Row Office Visit from 12/15/2021 [facility] Video Visit from 12/07/2021 [facility] Video Visit from 10/04/2021 in [facility] Video Visit from 08/03/2021 in [facility] Video Visit from 05/26/2021 in [facility]  PHQ-2 Total Score 3 5 1 3 4   PHQ-9 Total Score 18 16 -- 12 21      Flowsheet Row Video Visit from 12/07/2021 in [facility] ED from 10/17/2021 in [facility] EMERGENCY DEPARTMENT Video Visit from 10/04/2021 in [facility]  C-SSRS RISK CATEGORY Moderate Risk Error: Q7 should not be populated when Q6 is No Low Risk        Assessment and Plan: Malayjah Otoole is a 36 year old female with a charted history of bipolar  disorder, generalized anxiety disorder and major depressive disorder.  Currently prescribed Wellbutrin, Abilify and BuSpar.  Which she reports taking and tolerating well.  States overall her mood has improved and stabilized since her medications were adjusted.  No thoughts of suicidal or homicidal ideation.  Denies auditory or visual hallucinations.  Does report some concerns with falling asleep however reports taking melatonin as indicated.  Discussed initiating hydroxyzine for sleep reported issues.  She was receptive to plan.  Patient to keep all outpatient follow-up appointments.  Support, encouragement and reassurance was provided.  Collaboration of Care: Collaboration of Care: Medication Management AEB patient to reinitiate hydroxyzine 25 mg nightly for sleep reported issues  Patient/Guardian was advised Release of Information must be obtained prior to any record release in order to collaborate their care with an outside provider. Patient/Guardian was advised if they have not already done so to contact the registration department to sign all necessary forms in order for [place] to release information regarding their care.   Consent: Patient/Guardian gives verbal consent for treatment and assignment of benefits for services provided during this visit. Patient/Guardian expressed understanding and agreed to proceed.    Oneta Rack, NP 06/26/2022, 10:48 AM

## 2022-06-26 NOTE — Plan of Care (Cosign Needed)
Multiple attempts to follow-up via telephonic.  Call [phone]"  We are sorry your call cannot be completed at this time."  Patient to reschedule appointment

## 2022-08-22 ENCOUNTER — Ambulatory Visit: Payer: No Typology Code available for payment source | Admitting: Certified Nurse Midwife

## 2022-08-22 DIAGNOSIS — Z3169 Encounter for other general counseling and advice on procreation: Secondary | ICD-10-CM

## 2022-08-30 NOTE — Progress Notes (Signed)
Did not come to scheduled visit.

## 2022-10-11 ENCOUNTER — Ambulatory Visit (HOSPITAL_COMMUNITY): Payer: Medicaid Other | Admitting: Clinical

## 2022-10-12 ENCOUNTER — Encounter (HOSPITAL_COMMUNITY): Payer: Self-pay | Admitting: Student in an Organized Health Care Education/Training Program

## 2022-10-12 ENCOUNTER — Telehealth (INDEPENDENT_AMBULATORY_CARE_PROVIDER_SITE_OTHER)
Payer: No Typology Code available for payment source | Admitting: Student in an Organized Health Care Education/Training Program

## 2022-10-12 DIAGNOSIS — F319 Bipolar disorder, unspecified: Secondary | ICD-10-CM

## 2022-10-12 DIAGNOSIS — F411 Generalized anxiety disorder: Secondary | ICD-10-CM | POA: Diagnosis not present

## 2022-10-12 MED ORDER — BUSPIRONE HCL 7.5 MG PO TABS: 7.5000 mg | ORAL_TABLET | Freq: Two times a day (BID) | ORAL | 2 refills | Status: DC

## 2022-10-12 MED ORDER — BUPROPION HCL ER (XL) 150 MG PO TB24: 150.0000 mg | ORAL_TABLET | ORAL | 2 refills | Status: DC

## 2022-10-12 MED ORDER — ARIPIPRAZOLE 10 MG PO TABS: 10.0000 mg | ORAL_TABLET | Freq: Every day | ORAL | 2 refills | Status: DC
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
## 2022-10-31 ENCOUNTER — Ambulatory Visit (HOSPITAL_COMMUNITY): Payer: No Typology Code available for payment source | Admitting: Clinical

## 2022-11-08 ENCOUNTER — Ambulatory Visit
Admission: EM | Admit: 2022-11-08 | Discharge: 2022-11-08 | Disposition: A | Discharge status: Home / Self Care | Payer: No Typology Code available for payment source | Attending: Physician Assistant | Admitting: Physician Assistant

## 2022-11-08 DIAGNOSIS — Z1152 Encounter for screening for COVID-19: Secondary | ICD-10-CM | POA: Diagnosis not present

## 2022-11-08 DIAGNOSIS — J069 Acute upper respiratory infection, unspecified: Secondary | ICD-10-CM | POA: Diagnosis present

## 2022-11-08 DIAGNOSIS — R6889 Other general symptoms and signs: Secondary | ICD-10-CM | POA: Insufficient documentation

## 2022-11-08 DIAGNOSIS — K047 Periapical abscess without sinus: Secondary | ICD-10-CM | POA: Diagnosis not present

## 2022-11-08 MED ORDER — OSELTAMIVIR PHOSPHATE 75 MG PO CAPS: 75.0000 mg | ORAL_CAPSULE | Freq: Two times a day (BID) | ORAL | 0 refills | Status: DC

## 2022-11-08 MED ORDER — AMOXICILLIN 500 MG PO CAPS: 500.0000 mg | ORAL_CAPSULE | Freq: Three times a day (TID) | ORAL | 0 refills | Status: DC

## 2022-11-08 NOTE — ED Triage Notes (Addendum)
Pt presents to [facility] with co of chills, ha, stuffiness, cough, congestion for 2 days. Pt reports tylenol and dayquill and nyquill for symptoms. Pt also reports l sided dental pain,. Pt reports they need a tooth removed but has not been able to get it done. Pain has been intermentant for one year but has worsened recently.

## 2022-11-08 NOTE — ED Provider Notes (Signed)
[facility]    CSN: [id number] Arrival date & time: 11/08/22  1004      History   Chief Complaint Chief Complaint  Patient presents with   URI   Dental Pain    HPI Claire Riley is a 37 y.o. female.   Patient here today for evaluation of chills, headache, congestion and cough that started 2 days ago. She reports subjective fever. She has not had any vomiting or diarrhea. She has tried multiple OTC meds without resolution of symptoms.   She has also had some left sided lower dental pain. She notes off and on pain from tooth that needs to be extracted over the last year but this has worsened over the last few days as well.   The history is provided by the patient.  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 Authorizing Provider  amoxicillin (AMOXIL) 500 MG capsule Take 1 capsule (500 mg total) by mouth 3 (three) times daily. 11/08/22  Yes Tomi Bamberger, PA-C  oseltamivir (TAMIFLU) 75 MG capsule Take 1 capsule (75 mg total) by mouth every 12  (twelve) hours. 11/08/22  Yes Tomi Bamberger, PA-C  ARIPiprazole (ABILIFY) 10 MG tablet Take 1 tablet (10 mg total) by mouth daily. 10/12/22 10/12/23  [name], MD  buPROPion (WELLBUTRIN XL) 150 MG 24 hr tablet Take 1 tablet (150 mg total) by mouth every morning. 10/12/22 10/12/23  [name], MD  busPIRone (BUSPAR) 7.5 MG tablet Take 1 tablet (7.5 mg total) by mouth 2 (two) times daily. 10/12/22 10/12/23  [name], MD  lidocaine (XYLOCAINE) 2 % solution Use as directed 15 mLs in the mouth or throat as needed for mouth pain. 08/14/21   Merrilee Jansky, MD  Melatonin 10 MG TABS Take 10 mg by mouth at bedtime. 10/05/21   Nwoko, Tommas Olp, PA  Multiple Vitamins-Minerals (MULTIVITAMIN WITH MINERALS) tablet Take 1 tablet by mouth daily.    [provider]  Prenatal MV-Min-Fe Fum-FA-DHA (PRENATAL/FOLIC ACID+DHA) 27-0.8-200 MG CAPS Take 1 tablet by mouth daily. 12/15/21   Worthy Rancher, MD    Family History Family History  Problem Relation Age  of Onset   Hypertension Mother    Scoliosis Mother    Hypertension Father    Diabetes Father    COPD Father    Scoliosis Sister    Diabetes Paternal Grandmother     Social History Social History   Tobacco Use   Smoking status: Some Days    Packs/day: 0.25    Types: Cigarettes   Smokeless tobacco: Never  Vaping Use   Vaping Use: Never used  Substance Use Topics   Alcohol use: No   Drug use: Not Currently    Types: Marijuana     Allergies   Tomato   Review of Systems Review of Systems  Constitutional:  Positive for chills and fever.  HENT:  Positive for congestion, dental problem and sore throat. Negative for ear pain.   Eyes:  Negative for discharge and redness.  Respiratory:  Positive for cough. Negative for shortness of breath and wheezing.   Gastrointestinal:  Negative for abdominal pain, diarrhea, nausea and vomiting.     Physical Exam Triage Vital Signs ED Triage Vitals  Enc Vitals Group      BP 11/08/22 1105 115/74     Pulse Rate 11/08/22 1105 89     Resp 11/08/22 1105 19     Temp 11/08/22 1105 97.6 F (36.4 C)     Temp Source 11/08/22 1105 Oral     SpO2 11/08/22 1105 98 %     Weight --      Height --      Head Circumference --      Peak Flow --      Pain Score 11/08/22 1102 7     Pain Loc --      Pain Edu? --      Excl. in Progress? --    No data found.  Updated Vital Signs BP 115/74   Pulse 89   Temp 97.6 F (36.4 C) (Oral)   Resp 19   SpO2 98%     Physical Exam Vitals and nursing note reviewed.  Constitutional:      General: She is not in acute distress.    Appearance: Normal appearance. She is not ill-appearing.  HENT:     Head: Normocephalic and atraumatic.     Nose: Congestion present.     Mouth/Throat:     Mouth: Mucous membranes are moist.     Pharynx: No oropharyngeal exudate or posterior oropharyngeal erythema.  Eyes:     Conjunctiva/sclera: Conjunctivae normal.  Cardiovascular:     Rate and Rhythm: Normal rate and regular rhythm.     Heart sounds: Normal heart sounds. No murmur heard. Pulmonary:     Effort: Pulmonary effort is normal. No respiratory distress.     Breath sounds: Normal breath sounds. No wheezing, rhonchi or rales.  Skin:    General: Skin is warm and dry.  Neurological:     Mental Status: She is alert.  Psychiatric:        Mood and Affect: Mood normal.        Thought Content: Thought content normal.      UC Treatments / Results  Labs (all labs ordered are listed, but only abnormal results are displayed) Labs Reviewed  SARS CORONAVIRUS 2 (TAT 6-24 HRS)    EKG   Radiology No results found.  Procedures Procedures (including critical care time)  Medications Ordered in UC Medications - No data to display  Initial Impression / Assessment and Plan / UC Course  I have reviewed the  triage vital signs and the nursing notes.  Pertinent labs & imaging results that were available during my care of the patient were  reviewed by me and considered in my medical decision making (see chart for details).    Suspect viral etiology of upper respiratory symptoms. Will treat to cover flu and will order covid screen. Amoxicillin also prescribed for treatment of dental abscess. Encouraged continued symptomatic treatment, increased fluids and follow up with any persistent or worsening symptoms.   Final Clinical Impressions(s) / UC Diagnoses   Final diagnoses:  Acute upper respiratory infection  Encounter for screening for COVID-19  Flu-like symptoms  Dental abscess   Discharge Instructions   None    ED Prescriptions     Medication Sig Dispense Auth. Provider   oseltamivir (TAMIFLU) 75 MG capsule Take 1 capsule (75 mg total) by mouth every 12 (twelve) hours. 10 capsule Ewell Poe F, PA-C   amoxicillin (AMOXIL) 500 MG capsule Take 1 capsule (500 mg total) by mouth 3 (three) times daily. 21 capsule Francene Finders, PA-C      PDMP not reviewed this encounter.   Francene Finders, PA-C 11/08/22 [phone]

## 2022-11-09 LAB — SARS CORONAVIRUS 2 (TAT 6-24 HRS): SARS Coronavirus 2: NEGATIVE

## 2022-11-21 ENCOUNTER — Ambulatory Visit: Payer: No Typology Code available for payment source | Admitting: Medical

## 2022-11-23 ENCOUNTER — Telehealth (INDEPENDENT_AMBULATORY_CARE_PROVIDER_SITE_OTHER)
Payer: No Typology Code available for payment source | Admitting: Student in an Organized Health Care Education/Training Program

## 2022-11-23 ENCOUNTER — Encounter (HOSPITAL_COMMUNITY): Payer: Self-pay | Admitting: Student in an Organized Health Care Education/Training Program

## 2022-11-23 DIAGNOSIS — F319 Bipolar disorder, unspecified: Secondary | ICD-10-CM | POA: Diagnosis not present

## 2022-11-23 DIAGNOSIS — F411 Generalized anxiety disorder: Secondary | ICD-10-CM | POA: Diagnosis not present

## 2022-11-23 MED ORDER — ARIPIPRAZOLE 15 MG PO TABS: 15.0000 mg | ORAL_TABLET | Freq: Every day | ORAL | 3 refills | Status: DC

## 2022-11-23 MED ORDER — BUSPIRONE HCL 7.5 MG PO TABS: 7.5000 mg | ORAL_TABLET | Freq: Two times a day (BID) | ORAL | 3 refills | Status: DC

## 2022-11-23 MED ORDER — BUPROPION HCL ER (XL) 150 MG PO TB24: 150.0000 mg | ORAL_TABLET | ORAL | 3 refills | Status: DC

## 2022-11-23 NOTE — Progress Notes (Signed)
BH MD/PA/NP OP Progress Note  11/23/2022 3:22 PM Claire Riley  MRN:  [id number]  Chief Complaint:  Chief Complaint  Patient presents with   Follow-up   Virtual Visit via Video Note  I connected with [name] on 11/23/22 at 11:30 AM EST by a video enabled telemedicine application and verified that I am speaking with the correct person using two identifiers.  Location: Patient: Home Provider: Office   I discussed the limitations of evaluation and management by telemedicine and the availability of in person appointments. The patient expressed understanding and agreed to proceed.  History of Present Illness:   Claire Riley is a 37 year old female seen and evaluated via video visit.  She has a charted history with generalized anxiety disorder, major depressive disorder and bipolar depression disorder.  Patient was previously prescribed the following medications:   Abilify 10 mg daily Wellbutrin XL 150 mg BuSpar 7.5 mg twice daily Hydroxyzine 25 mg nightly    Patient reports that she is compliant with her medications, but she feels she is still having some trouble falling asleep. Patient reports that he mood is "better" but not "exactly where she needs to be." Patient reports that she more good days then bad. Patient denies any issues with frequent night time awakening. Patient denies anhedonia and is able to enjoy marriage and work. Patient denies feeling overly hopeless/ worthless/ and guilty. Patient reports that her appetite has been good. Patient reports that her concentration is at it's baseline. Patient denies feeling constantly irritable. Patient reports that she can get anxious at work, when she feels overwhelmed at work but she is able to "power through it."   Patient reports that she is stress and worried about finances. Patient reports that she tries to cope with her stress by watching Youtube. Patient reports that she would like to pick up reading and coloring as well.    Patient reports that last week, she went about 3 days where she wasn't able to sleep well, her thoughts were racing, felt a bit more anxious and thinks she was getting about 6h of sleep at night. Patient reports that she did feel more impulsive during this time frame. Patient endorses that she did feel more easily distracted. Patient reports that her husband and mom noticed that change in her mood. Patient reports that they commented on how irritable she was.   Patient denies SI, HI, and VH. Patient reports that she has occasional voices from childhood trauma that she hears when she starts to have self- deprecating. Patient reports that she does not think that these are memories, because sometimes the things said, she is sure that she did not hear the specific statement in childhood. Last AH was this past Sunday.     I discussed the assessment and treatment plan with the patient. The patient was provided an opportunity to ask questions and all were answered. The patient agreed with the plan and demonstrated an understanding of the instructions.   The patient was advised to call back or seek an in-person evaluation if the symptoms worsen or if the condition fails to improve as anticipated.  I provided 25 minutes of non-face-to-face time during this encounter.   [name], MD  Visit Diagnosis:    ICD-10-CM   1. Bipolar depression (HCC)  F31.9 ARIPiprazole (ABILIFY) 15 MG tablet    buPROPion (WELLBUTRIN XL) 150 MG 24 hr tablet    2. Generalized anxiety disorder  F41.1 busPIRone (BUSPAR) 7.5 MG tablet  Past Psychiatric History:  Dx: Bipolar disorder 15 years ago. Patient reports that she has manic episodes, she went 2 weeks w/o sleep and this also occurred she gave birth to first child. Had a lot of energy.  Last visit: 09/2022-patient medications continued at their current doses, patient did endorse having some anxiety but had significant external stressors that she was able to  attribute this to  Past Medical History:  Past Medical History:  Diagnosis Date   Anemia    Bronchial asthma    last attack 54yrs ago   Depression    Gonorrhea    Headache(784.0)    Pneumonia    Trichomonas    UTI (lower urinary tract infection)     Past Surgical History:  Procedure Laterality Date   WISDOM TOOTH EXTRACTION     2007    Family Psychiatric History: Unknown at this time  Family History:  Family History  Problem Relation Age of Onset   Hypertension Mother    Scoliosis Mother    Hypertension Father    Diabetes Father    COPD Father    Scoliosis Sister    Diabetes Paternal Grandmother     Social History:  Social History   Socioeconomic History   Marital status: Married    Spouse name: Not on file   Number of children: Not on file   Years of education: Not on file   Highest education level: Not on file  Occupational History    Employer: [employer]    Comment: [employer]  Tobacco Use   Smoking status: Some Days    Packs/day: 0.25    Types: Cigarettes   Smokeless tobacco: Never  Vaping Use   Vaping Use: Never used  Substance and Sexual Activity   Alcohol use: No   Drug use: Not Currently    Types: Marijuana   Sexual activity: Not Currently  Other Topics Concern   Not on file  Social History Narrative   Not on file   Social Determinants of Health   Financial Resource Strain: Not on file  Food Insecurity: Food Insecurity Present (12/15/2021)   Hunger Vital Sign    Worried About Running Out of Food in the Last Year: Sometimes true    Ran Out of Food in the Last Year: Sometimes true  Transportation Needs: Unmet Transportation Needs (12/15/2021)   PRAPARE - Hydrologist (Medical): Yes    Lack of Transportation (Non-Medical): Yes  Physical Activity: Not on file  Stress: Not on file  Social Connections: Not on file    Allergies:  Allergies  Allergen Reactions   Tomato Hives    Metabolic  Disorder Labs: Lab Results  Component Value Date   HGBA1C 5.3 08/17/2020   MPG 105 08/17/2020   No results found for: "PROLACTIN" Lab Results  Component Value Date   CHOL 154 08/17/2020   TRIG 77 08/17/2020   HDL 24 (L) 08/17/2020   CHOLHDL 6.4 08/17/2020   VLDL 15 08/17/2020   LDLCALC 115 (H) 08/17/2020   Lab Results  Component Value Date   TSH 0.699 08/17/2020    Therapeutic Level Labs: No results found for: "LITHIUM" No results found for: "VALPROATE" No results found for: "CBMZ"  Current Medications: Current Outpatient Medications  Medication Sig Dispense Refill   amoxicillin (AMOXIL) 500 MG capsule Take 1 capsule (500 mg total) by mouth 3 (three) times daily. 21 capsule 0   ARIPiprazole (ABILIFY) 15 MG tablet Take 1 tablet (15  mg total) by mouth daily. 30 tablet 3   buPROPion (WELLBUTRIN XL) 150 MG 24 hr tablet Take 1 tablet (150 mg total) by mouth every morning. 30 tablet 3   busPIRone (BUSPAR) 7.5 MG tablet Take 1 tablet (7.5 mg total) by mouth 2 (two) times daily. 60 tablet 3   lidocaine (XYLOCAINE) 2 % solution Use as directed 15 mLs in the mouth or throat as needed for mouth pain. 100 mL 0   Melatonin 10 MG TABS Take 10 mg by mouth at bedtime. 30 tablet 1   Multiple Vitamins-Minerals (MULTIVITAMIN WITH MINERALS) tablet Take 1 tablet by mouth daily.     oseltamivir (TAMIFLU) 75 MG capsule Take 1 capsule (75 mg total) by mouth every 12 (twelve) hours. 10 capsule 0   Prenatal MV-Min-Fe Fum-FA-DHA (PRENATAL/FOLIC ACID+DHA) 27-0.8-200 MG CAPS Take 1 tablet by mouth daily. 30 capsule 5   No current facility-administered medications for this visit.     Musculoskeletal: defer  Psychiatric Specialty Exam: Review of Systems  Psychiatric/Behavioral:  Positive for hallucinations and sleep disturbance. Negative for decreased concentration and suicidal ideas.     There were no vitals taken for this visit.There is no height or weight on file to calculate BMI.  General  Appearance: Casual  Eye Contact:  Good  Speech:  Clear and Coherent  Volume:  Normal  Mood:  Euthymic  Affect:  Appropriate  Thought Process:  Coherent  Orientation:  Full (Time, Place, and Person)  Thought Content: Logical   Suicidal Thoughts:  No  Homicidal Thoughts:  No  Memory:  Immediate;   Good Recent;   Good  Judgement:  Fair  Insight:  Fair  Psychomotor Activity:  Normal  Concentration:  Concentration: Good  Recall:  NA  Fund of Knowledge: Good  Language: Good  Akathisia:  No  Handed:    AIMS (if indicated): not done  Assets:  Communication Skills Desire for Improvement Housing Intimacy Resilience Social Support  ADL's:  Intact  Cognition: WNL  Sleep:   IMproving   Screenings: GAD-7    Flowsheet Row Office Visit from 12/15/2021 in [facility] [employer] [facility] Video Visit from 12/07/2021 in [facility] Video Visit from 10/04/2021 in [facility] Video Visit from 08/03/2021 in [facility] Video Visit from 05/26/2021 in [facility]  Total GAD-7 Score 17 19 21 14 19       PHQ2-9    Flowsheet Row [facility] Visit [facility] Video Visit from 12/07/2021 in [facility] Video Visit from 10/04/2021 in [facility] Video Visit from [facility] from 05/26/2021 in [facility]  PHQ-2 Total Score 3 5 1 3 4   PHQ-9 Total Score 18 16 -- 12 21      Flowsheet Row ED from 11/08/2022 in [facility]) Video Visit from 12/07/2021 in [facility] ED from 10/17/2021 in [facility] Emergency Department at [facility]  C-SSRS RISK CATEGORY No Risk Moderate Risk Error: Q7  should not be populated when Q6 is No        Assessment and Plan:  Maciah Feeback is a 37 year old female with a charted history of bipolar disorder, generalized anxiety disorder and major depressive disorder.   On assessment patient  appears to be endorsing a recent hypomanic episode, could benefit from increasing her Abilify to help with mood stabilization.  Patient does appear to be finding some benefit in her medications however, with continued mood instability we will make a Jenea Dake minutes at this visit.  Patient's hypomanic presentation versus manic presentation is likely due to being on a mood stabilizing medication.  Bipolar 1 disorder, most recent episode manic - Increase Abilify to 15 mg -Continue Wellbutrin XL 150 mg daily   GAD - Continue BuSpar 7.5 mg twice daily     Follow-up in approximately 3 months  Collaboration of Care: Collaboration of Care:   Patient/Guardian was advised Release of Information must be obtained prior to any record release in order to collaborate their care with an outside provider. Patient/Guardian was advised if they have not already done so to contact the registration department to sign all necessary forms in order for [place] to release information regarding their care.   Consent: Patient/Guardian gives verbal consent for treatment and assignment of benefits for services provided during this visit. Patient/Guardian expressed understanding and agreed to proceed.   PGY-3 Freida Busman, MD 11/23/2022, 3:22 PM

## 2022-11-26 ENCOUNTER — Ambulatory Visit (INDEPENDENT_AMBULATORY_CARE_PROVIDER_SITE_OTHER): Payer: No Typology Code available for payment source | Admitting: Clinical

## 2022-11-26 DIAGNOSIS — F411 Generalized anxiety disorder: Secondary | ICD-10-CM

## 2022-11-26 DIAGNOSIS — F319 Bipolar disorder, unspecified: Secondary | ICD-10-CM
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
## 2022-12-20 ENCOUNTER — Ambulatory Visit (INDEPENDENT_AMBULATORY_CARE_PROVIDER_SITE_OTHER): Payer: No Typology Code available for payment source | Admitting: Clinical

## 2022-12-20 DIAGNOSIS — F319 Bipolar disorder, unspecified: Secondary | ICD-10-CM | POA: Diagnosis not present
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
## 2023-01-11 ENCOUNTER — Encounter (HOSPITAL_COMMUNITY): Payer: Self-pay

## 2023-01-11 ENCOUNTER — Ambulatory Visit: Payer: No Typology Code available for payment source | Admitting: Medical

## 2023-02-01 ENCOUNTER — Telehealth (HOSPITAL_COMMUNITY): Payer: Self-pay | Admitting: Clinical

## 2023-02-01 ENCOUNTER — Encounter (HOSPITAL_COMMUNITY): Payer: Self-pay

## 2023-02-01 ENCOUNTER — Ambulatory Visit (HOSPITAL_COMMUNITY): Payer: No Typology Code available for payment source | Admitting: Clinical

## 2023-02-01 NOTE — Telephone Encounter (Signed)
Therapist made video contact with the client at the appt time. Client reported she is doing well. Client reported she had not concerns to discuss at time of the appt that would require the length of a billable session. Client reported she is doing well and will check in at her next appt.

## 2023-02-15 ENCOUNTER — Encounter (HOSPITAL_COMMUNITY): Payer: Self-pay

## 2023-02-15 ENCOUNTER — Ambulatory Visit (HOSPITAL_COMMUNITY): Payer: No Typology Code available for payment source | Admitting: Clinical

## 2023-02-20 ENCOUNTER — Telehealth (HOSPITAL_COMMUNITY)
Payer: No Typology Code available for payment source | Admitting: Student in an Organized Health Care Education/Training Program

## 2023-02-21 ENCOUNTER — Ambulatory Visit: Payer: No Typology Code available for payment source | Admitting: Obstetrics and Gynecology

## 2023-03-06 ENCOUNTER — Telehealth (HOSPITAL_COMMUNITY)
Payer: No Typology Code available for payment source | Admitting: Student in an Organized Health Care Education/Training Program

## 2023-03-06 ENCOUNTER — Encounter (HOSPITAL_COMMUNITY): Payer: Self-pay

## 2023-03-06 NOTE — Progress Notes (Unsigned)
Virtual Visit via Video Note  I connected with Claire Riley on 03/06/23 at 10:00 AM EDT by a video enabled telemedicine application and verified that I am speaking with the correct person using two identifiers.  Location: Patient: *** Provider: ***   I discussed the limitations of evaluation and management by telemedicine and the availability of in person appointments. The patient expressed understanding and agreed to proceed.    I discussed the assessment and treatment plan with the patient. The patient was provided an opportunity to ask questions and all were answered. The patient agreed with the plan and demonstrated an understanding of the instructions.   The patient was advised to call back or seek an in-person evaluation if the symptoms worsen or if the condition fails to improve as anticipated.  I provided *** minutes of non-face-to-face time during this encounter.   [name], MD  [facility] MD/PA/NP OP Progress Note  03/06/2023 10:09 AM Claire Riley  MRN:  [id number]  Chief Complaint: No chief complaint on file.  HPI: *** Visit Diagnosis: No diagnosis found.  Past Psychiatric History: ***  Past Medical History:  Past Medical History:  Diagnosis Date   Anemia    Bronchial asthma    last attack 64yrs ago   Depression    Gonorrhea    Headache(784.0)    Pneumonia    Trichomonas    UTI (lower urinary tract infection)     Past Surgical History:  Procedure Laterality Date   WISDOM TOOTH EXTRACTION     2007    Family Psychiatric History: ***  Family History:  Family History  Problem Relation Age of Onset   Hypertension Mother    Scoliosis Mother    Hypertension Father    Diabetes Father    COPD Father    Scoliosis Sister    Diabetes Paternal Grandmother     Social History:  Social History   Socioeconomic History   Marital status: Married    Spouse name: Not on file   Number of children: Not on file   Years of education: Not on file   Highest  education level: Not on file  Occupational History    Employer: [employer]    Comment: [employer]  Tobacco Use   Smoking status: Some Days    Packs/day: .25    Types: Cigarettes   Smokeless tobacco: Never  Vaping Use   Vaping Use: Never used  Substance and Sexual Activity   Alcohol use: No   Drug use: Not Currently    Types: Marijuana   Sexual activity: Not Currently  Other Topics Concern   Not on file  Social History Narrative   Not on file   Social Determinants of Health   Financial Resource Strain: Not on file  Food Insecurity: Food Insecurity Present (12/15/2021)   Hunger Vital Sign    Worried About Running Out of Food in the Last Year: Sometimes true    Ran Out of Food in the Last Year: Sometimes true  Transportation Needs: Unmet Transportation Needs (12/15/2021)   PRAPARE - Administrator, Civil Service (Medical): Yes    Lack of Transportation (Non-Medical): Yes  Physical Activity: Not on file  Stress: Not on file  Social Connections: Not on file    Allergies:  Allergies  Allergen Reactions   Tomato Hives    Metabolic Disorder Labs: Lab Results  Component Value Date   HGBA1C 5.3 08/17/2020   MPG 105 08/17/2020   No results found  for: "PROLACTIN" Lab Results  Component Value Date   CHOL 154 08/17/2020   TRIG 77 08/17/2020   HDL 24 (L) 08/17/2020   CHOLHDL 6.4 08/17/2020   VLDL 15 08/17/2020   LDLCALC 115 (H) 08/17/2020   Lab Results  Component Value Date   TSH 0.699 08/17/2020    Therapeutic Level Labs: No results found for: "LITHIUM" No results found for: "VALPROATE" No results found for: "CBMZ"  Current Medications: Current Outpatient Medications  Medication Sig Dispense Refill   amoxicillin (AMOXIL) 500 MG capsule Take 1 capsule (500 mg total) by mouth 3 (three) times daily. 21 capsule 0   ARIPiprazole (ABILIFY) 15 MG tablet Take 1 tablet (15 mg total) by mouth daily. 30 tablet 3   buPROPion (WELLBUTRIN XL) 150  MG 24 hr tablet Take 1 tablet (150 mg total) by mouth every morning. 30 tablet 3   busPIRone (BUSPAR) 7.5 MG tablet Take 1 tablet (7.5 mg total) by mouth 2 (two) times daily. 60 tablet 3   lidocaine (XYLOCAINE) 2 % solution Use as directed 15 mLs in the mouth or throat as needed for mouth pain. 100 mL 0   Melatonin 10 MG TABS Take 10 mg by mouth at bedtime. 30 tablet 1   Multiple Vitamins-Minerals (MULTIVITAMIN WITH MINERALS) tablet Take 1 tablet by mouth daily.     oseltamivir (TAMIFLU) 75 MG capsule Take 1 capsule (75 mg total) by mouth every 12 (twelve) hours. 10 capsule 0   Prenatal MV-Min-Fe Fum-FA-DHA (PRENATAL/FOLIC ACID+DHA) 27-0.8-200 MG CAPS Take 1 tablet by mouth daily. 30 capsule 5   No current facility-administered medications for this visit.     Musculoskeletal: Strength & Muscle Tone: {desc; muscle tone:32375} Gait & Station: {PE GAIT ED ZOXW:96045} Patient leans: {Patient Leans:21022755}  Psychiatric Specialty Exam: Review of Systems  There were no vitals taken for this visit.There is no height or weight on file to calculate BMI.  General Appearance: {Appearance:22683}  Eye Contact:  {BHH EYE CONTACT:22684}  Speech:  {Speech:22685}  Volume:  {Volume (PAA):22686}  Mood:  {BHH MOOD:22306}  Affect:  {Affect (PAA):22687}  Thought Process:  {Thought Process (PAA):22688}  Orientation:  {BHH ORIENTATION (PAA):22689}  Thought Content: {Thought Content:22690}   Suicidal Thoughts:  {ST/HT (PAA):22692}  Homicidal Thoughts:  {ST/HT (PAA):22692}  Memory:  {BHH MEMORY:22881}  Judgement:  {Judgement (PAA):22694}  Insight:  {Insight (PAA):22695}  Psychomotor Activity:  {Psychomotor (PAA):22696}  Concentration:  {Concentration:21399}  Recall:  {BHH GOOD/FAIR/POOR:22877}  Fund of Knowledge: {BHH GOOD/FAIR/POOR:22877}  Language: {BHH GOOD/FAIR/POOR:22877}  Akathisia:  {BHH YES OR NO:22294}  Handed:  {Handed:22697}  AIMS (if indicated): {Desc; done/not:10129}  Assets:  {Assets  (PAA):22698}  ADL's:  {BHH WUJ'W:11914}  Cognition: {chl bhh cognition:304700322}  Sleep:  {BHH GOOD/FAIR/POOR:22877}   Screenings: GAD-7    Flowsheet Row Office Visit from 12/15/2021 in [facility] [employer] [facility] Video Visit from 12/07/2021 in [facility] Video Visit from 10/04/2021 in [facility] Video Visit from 08/03/2021 in [facility] Video Visit from 05/26/2021 in [facility]  Total GAD-7 Score 17 19 21 14 19       PHQ2-9    Flowsheet Row Office Visit from 12/15/2021 in [facility] Video Visit from 12/07/2021 in [facility] Video Visit from 10/04/2021 in [facility] Video Visit from 08/03/2021 in [facility] Video Visit from 05/26/2021  in [facility]  PHQ-2 Total Score 3 5 1 3 4   PHQ-9 Total Score 18 16 -- 12 21      Flowsheet Row ED from 11/08/2022 in [facility]) Video Visit from 12/07/2021 in [facility] ED from 10/17/2021 in [facility] Emergency Department at [facility]  C-SSRS RISK CATEGORY No Risk Moderate Risk Error: Q7 should not be populated when Q6 is No        Assessment and Plan: ***  Collaboration of Care: Collaboration of Care: [facility] OP Collaboration of Care:[id number]}  Patient/Guardian was advised Release of Information must be obtained prior to any record release in order to collaborate their care with an outside provider. Patient/Guardian was advised if they have not already done so to contact the registration department to sign all necessary forms in order for [place] to release information regarding their care.   Consent: Patient/Guardian gives verbal consent for treatment and  assignment of benefits for services provided during this visit. Patient/Guardian expressed understanding and agreed to proceed.    [name], MD 03/06/2023, 10:09 AM

## 2023-04-11 ENCOUNTER — Ambulatory Visit
Admission: EM | Admit: 2023-04-11 | Discharge: 2023-04-11 | Disposition: A | Discharge status: Home / Self Care | Payer: No Typology Code available for payment source | Attending: Nurse Practitioner | Admitting: Nurse Practitioner

## 2023-04-11 DIAGNOSIS — J9801 Acute bronchospasm: Secondary | ICD-10-CM | POA: Diagnosis not present

## 2023-04-11 MED ORDER — IPRATROPIUM-ALBUTEROL 0.5-2.5 (3) MG/3ML IN SOLN
3.0000 mL | Freq: Once | RESPIRATORY_TRACT | Status: AC
  Administered 2023-04-11: 3 mL via RESPIRATORY_TRACT

## 2023-04-11 MED ORDER — ALBUTEROL SULFATE HFA 108 (90 BASE) MCG/ACT IN AERS: 1.0000 | INHALATION_SPRAY | Freq: Four times a day (QID) | RESPIRATORY_TRACT | 0 refills | Status: AC | PRN

## 2023-04-11 NOTE — ED Triage Notes (Signed)
Pt reports centralized chest pain a 2 days. Pt feels pressure sitting on her chest. Pt is a current everyday smoker.

## 2023-04-11 NOTE — Discharge Instructions (Addendum)
Your EKG indicated normal sinus rhythm.  You have been treated for an asthma flare with DuoNeb.  The recommendation is for you to follow-up with your primary care provider for your asthma.  Smoking cessation will help to prevent asthma flareups.  She has been prescribed an albuterol inhaler 1 to 2 puffs 6 hours as needed for shortness of breath or wheezing.

## 2023-04-11 NOTE — ED Provider Notes (Signed)
[facility]    CSN: [id number] Arrival date & time: 04/11/23  1150      History   Chief Complaint No chief complaint on file.   HPI Claire Riley is a 37 y.o. female.   HPI  She is in today complaining of 2 days of chest pain.  She describes it as pressure center of her chest.  She did have episode of nausea and vomiting on yesterday.  She reports that it was something that she ate.  She denies jaw pain, shortness of breath.  She does have a history of asthma reports that she does not have an inhaler or nebulizer at home.  She is a smoker and does smoke daily.  She works in [employer] and reports that she did experience some swelling in her legs on yesterday at the end of her shift. 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 Authorizing Provider  albuterol (VENTOLIN HFA) 108 (90 Base) MCG/ACT inhaler Inhale 1-2 puffs into the lungs every 6 (six) hours as needed for wheezing or shortness of breath. 04/11/23  Yes Barbette Merino, NP  amoxicillin (AMOXIL) 500 MG capsule Take 1 capsule (500 mg total) by mouth 3 (three) times daily. 11/08/22   Tomi Bamberger, PA-C  ARIPiprazole (ABILIFY) 15 MG tablet Take 1 tablet (15 mg total) by mouth  daily. 11/23/22 11/23/23  [name], MD  buPROPion (WELLBUTRIN XL) 150 MG 24 hr tablet Take 1 tablet (150 mg total) by mouth every morning. 11/23/22 11/23/23  [name], MD  busPIRone (BUSPAR) 7.5 MG tablet Take 1 tablet (7.5 mg total) by mouth 2 (two) times daily. 11/23/22 11/23/23  [name], MD  lidocaine (XYLOCAINE) 2 % solution Use as directed 15 mLs in the mouth or throat as needed for mouth pain. 08/14/21   Merrilee Jansky, MD  Melatonin 10 MG TABS Take 10 mg by mouth at bedtime. 10/05/21   Nwoko, Tommas Olp, PA  Multiple Vitamins-Minerals (MULTIVITAMIN WITH MINERALS) tablet Take 1 tablet by mouth daily.    [provider]  oseltamivir (TAMIFLU) 75 MG capsule Take 1 capsule (75 mg total) by mouth every 12 (twelve) hours. 11/08/22   Tomi Bamberger, PA-C  Prenatal MV-Min-Fe Fum-FA-DHA (PRENATAL/FOLIC ACID+DHA) 27-0.8-200 MG CAPS Take 1 tablet by mouth daily. 12/15/21   Worthy Rancher, MD  Family History Family History  Problem Relation Age of Onset   Hypertension Mother    Scoliosis Mother    Hypertension Father    Diabetes Father    COPD Father    Scoliosis Sister    Diabetes Paternal Grandmother     Social History Social History   Tobacco Use   Smoking status: Some Days    Packs/day: .25    Types: Cigarettes   Smokeless tobacco: Never  Vaping Use   Vaping Use: Never used  Substance Use Topics   Alcohol use: No   Drug use: Not Currently    Types: Marijuana     Allergies   Tomato   Review of Systems Review of Systems   Physical Exam Triage Vital Signs ED Triage Vitals  Enc Vitals Group     BP      Pulse      Resp      Temp      Temp src      SpO2      Weight      Height      Head Circumference      Peak Flow      Pain Score      Pain Loc      Pain Edu?      Excl. in GC?    No data found.  Updated Vital Signs BP 115/79 (BP Location: Left Arm)   Pulse 74   Temp 98 F (36.7 C) (Oral)   Resp 18   LMP 03/29/2023    SpO2 94%   Visual Acuity Right Eye Distance:   Left Eye Distance:   Bilateral Distance:    Right Eye Near:   Left Eye Near:    Bilateral Near:     Physical Exam Constitutional:      Appearance: She is obese.  HENT:     Head: Normocephalic and atraumatic.  Cardiovascular:     Rate and Rhythm: Normal rate.     Pulses: Normal pulses.     Heart sounds: Normal heart sounds.  Pulmonary:     Breath sounds: No wheezing, rhonchi or rales.     Comments: Diminished breath sounds no air movement posteriorly good air movement in the anteriorly Musculoskeletal:        General: Normal range of motion.     Cervical back: Normal range of motion.  Skin:    General: Skin is warm and dry.     Capillary Refill: Capillary refill takes less than 2 seconds.  Neurological:     General: No focal deficit present.     Mental Status: She is alert and oriented to person, place, and time.  Psychiatric:        Mood and Affect: Mood normal.        Behavior: Behavior normal.      UC Treatments / Results  Labs (all labs ordered are listed, but only abnormal results are displayed) Labs Reviewed - No data to display  EKG   Radiology No results found.  Procedures Procedures (including critical care time)  Medications Ordered in UC Medications  ipratropium-albuterol (DUONEB) 0.5-2.5 (3) MG/3ML nebulizer solution 3 mL (3 mLs Nebulization Given 04/11/23 1222)    Initial Impression / Assessment and Plan / UC Course  I have reviewed the triage vital signs and the nursing notes.  Pertinent labs & imaging results that were available during my care of the patient were reviewed by me and considered in my medical decision making (see  chart for details).     Chest pain most Final Clinical Impressions(s) / UC Diagnoses   Final diagnoses:  Bronchospasm     Discharge Instructions      Your EKG indicated normal sinus rhythm.  You have been treated for an asthma flare with DuoNeb.  The  recommendation is for you to follow-up with your primary care provider for your asthma.  Smoking cessation will help to prevent asthma flareups.  She has been prescribed an albuterol inhaler 1 to 2 puffs 6 hours as needed for shortness of breath or wheezing.     ED Prescriptions     Medication Sig Dispense Auth. Provider   albuterol (VENTOLIN HFA) 108 (90 Base) MCG/ACT inhaler Inhale 1-2 puffs into the lungs every 6 (six) hours as needed for wheezing or shortness of breath. 8 g Barbette Merino, NP      PDMP not reviewed this encounter.   Thad Ranger [place], NP 04/11/23 1255

## 2023-04-15 ENCOUNTER — Emergency Department (HOSPITAL_COMMUNITY): Payer: No Typology Code available for payment source

## 2023-04-15 ENCOUNTER — Other Ambulatory Visit: Payer: Self-pay

## 2023-04-15 ENCOUNTER — Emergency Department (HOSPITAL_COMMUNITY)
Admission: EM | Admit: 2023-04-15 | Discharge: 2023-04-15 | Disposition: A | Discharge status: Home / Self Care | Payer: No Typology Code available for payment source | Attending: Emergency Medicine | Admitting: Emergency Medicine

## 2023-04-15 ENCOUNTER — Encounter (HOSPITAL_COMMUNITY): Payer: Self-pay

## 2023-04-15 DIAGNOSIS — F17200 Nicotine dependence, unspecified, uncomplicated: Secondary | ICD-10-CM | POA: Diagnosis not present

## 2023-04-15 DIAGNOSIS — R079 Chest pain, unspecified: Secondary | ICD-10-CM | POA: Diagnosis present

## 2023-04-15 DIAGNOSIS — R0789 Other chest pain: Secondary | ICD-10-CM | POA: Diagnosis not present

## 2023-04-15 LAB — BASIC METABOLIC PANEL
Anion gap: 13 (ref 5–15)
BUN: 7 mg/dL (ref 6–20)
CO2: 21 mmol/L — ABNORMAL LOW (ref 22–32)
Calcium: 8.9 mg/dL (ref 8.9–10.3)
Chloride: 106 mmol/L (ref 98–111)
Creatinine, Ser: 0.8 mg/dL (ref 0.44–1.00)
GFR, Estimated: >60 mL/min (ref >60)
Glucose, Bld: 96 mg/dL (ref 70–99)
Potassium: 3.4 mmol/L — ABNORMAL LOW (ref 3.5–5.1)
Sodium: 140 mmol/L (ref 135–145)

## 2023-04-15 LAB — CBC
HCT: 42.7 % (ref 36.0–46.0)
Hemoglobin: 13.6 g/dL (ref 12.0–15.0)
MCH: 26.8 pg (ref 26.0–34.0)
MCHC: 31.9 g/dL (ref 30.0–36.0)
MCV: 84.2 fL (ref 80.0–100.0)
Platelets: 206 10*3/uL (ref 150–400)
RBC: 5.07 MIL/uL (ref 3.87–5.11)
RDW: 13.5 % (ref 11.5–15.5)
WBC: 8.6 10*3/uL (ref 4.0–10.5)
nRBC: 0 % (ref 0.0–0.2)

## 2023-04-15 LAB — TROPONIN I (HIGH SENSITIVITY)
Troponin I (High Sensitivity): 3 ng/L (ref <18)
Troponin I (High Sensitivity): 3 ng/L (ref <18)

## 2023-04-15 LAB — I-STAT BETA HCG BLOOD, ED (MC, WL, AP ONLY): I-stat hCG, quantitative: <5 m[IU]/mL (ref <5)

## 2023-04-15 LAB — D-DIMER, QUANTITATIVE: D-Dimer, Quant: <0.27 ug/mL-FEU (ref 0.00–0.50)

## 2023-04-15 MED ORDER — KETOROLAC TROMETHAMINE 30 MG/ML IJ SOLN
30.0000 mg | Freq: Once | INTRAMUSCULAR | Status: AC
  Administered 2023-04-15: 30 mg via INTRAMUSCULAR
  Filled 2023-04-15: qty 1

## 2023-04-15 MED ORDER — IPRATROPIUM-ALBUTEROL 0.5-2.5 (3) MG/3ML IN SOLN
3.0000 mL | Freq: Once | RESPIRATORY_TRACT | Status: AC
  Administered 2023-04-15: 3 mL via RESPIRATORY_TRACT
  Filled 2023-04-15: qty 3

## 2023-04-15 MED ORDER — PREDNISONE 20 MG PO TABS
60.0000 mg | ORAL_TABLET | Freq: Once | ORAL | Status: AC
  Administered 2023-04-15: 60 mg via ORAL
  Filled 2023-04-15: qty 3

## 2023-04-15 MED ORDER — OXYCODONE-ACETAMINOPHEN 5-325 MG PO TABS
1.0000 | ORAL_TABLET | Freq: Once | ORAL | Status: AC
  Administered 2023-04-15: 1 via ORAL
  Filled 2023-04-15: qty 1

## 2023-04-15 NOTE — ED Provider Notes (Signed)
[place] [facility] DEPARTMENT AT [facility] Provider Note   CSN: [id number] Arrival date & time: 04/15/23  0046     History  Chief Complaint  Patient presents with   Chest Pain    Claire Riley is a 37 y.o. female.   Chest Pain  Patient is a 37 year old female with a past medical history significant for bipolar, insomnia, anxiety, STDs  She is present emergency room today with complaints of chest pressure that has been persistent for 6 days now.  She states that it is a pressure in the center of her chest.  She states on the third day of her symptoms she had 1 episode of nonbloody nonbilious emesis and some nausea preceding this however felt better after the episode of emesis.  She states that she is not feeling particularly short of breath and denies any jaw pain  She is a smoker and does smoke daily.  States that she works at [employer] and is on her feet majority of the day.  She notes that she had some swelling in her legs at the end of her shift last few days.  She was given a breathing treatment at urgent care 4 days ago and states that she had some improvement temporarily with her symptoms.  No recent surgeries, hospitalization, long travel, hemoptysis, estrogen containing OCP, cancer history.  No unilateral leg swelling.  No history of PE or VTE.     Home Medications Prior to Admission medications   Medication Sig Start Date End Date Taking? Authorizing Provider  albuterol (VENTOLIN HFA) 108 (90 Base) MCG/ACT inhaler Inhale 1-2 puffs into the lungs every 6 (six) hours as needed for wheezing or shortness of breath. 04/11/23   Barbette Merino, NP  amoxicillin (AMOXIL) 500 MG capsule Take 1 capsule (500 mg total) by mouth 3 (three) times daily. 11/08/22   Tomi Bamberger, PA-C  ARIPiprazole (ABILIFY) 15 MG tablet Take 1 tablet (15 mg total) by mouth daily. 11/23/22 11/23/23  [name], MD  buPROPion (WELLBUTRIN XL) 150 MG 24 hr tablet Take 1 tablet  (150 mg total) by mouth every morning. 11/23/22 11/23/23  [name], MD  busPIRone (BUSPAR) 7.5 MG tablet Take 1 tablet (7.5 mg total) by mouth 2 (two) times daily. 11/23/22 11/23/23  [name], MD  lidocaine (XYLOCAINE) 2 % solution Use as directed 15 mLs in the mouth or throat as needed for mouth pain. 08/14/21   Merrilee Jansky, MD  Melatonin 10 MG TABS Take 10 mg by mouth at bedtime. 10/05/21   Nwoko, Tommas Olp, PA  Multiple Vitamins-Minerals (MULTIVITAMIN WITH MINERALS) tablet Take 1 tablet by mouth daily.    [provider]  oseltamivir (TAMIFLU) 75 MG capsule Take 1 capsule (75 mg total) by mouth every 12 (twelve) hours. 11/08/22   Tomi Bamberger, PA-C  Prenatal MV-Min-Fe Fum-FA-DHA (PRENATAL/FOLIC ACID+DHA) 27-0.8-200 MG CAPS Take 1 tablet by mouth daily. 12/15/21   Worthy Rancher, MD      Allergies    Tomato    Review of Systems   Review of Systems  Cardiovascular:  Positive for chest pain.    Physical Exam Updated Vital Signs BP 113/83   Pulse 81   Temp 98 F (36.7 C) (Oral)   Resp 20   Ht 6\' 2"  (1.88 m)   Wt 113.4 kg   LMP 03/29/2023 (Exact Date)   SpO2 99%   BMI 32.10 kg/m  Physical Exam Vitals and nursing  note reviewed.  Constitutional:      General: She is not in acute distress. HENT:     Head: Normocephalic and atraumatic.     Nose: Nose normal.     Mouth/Throat:     Mouth: Mucous membranes are moist.  Eyes:     General: No scleral icterus. Cardiovascular:     Rate and Rhythm: Normal rate and regular rhythm.     Pulses: Normal pulses.     Heart sounds: Normal heart sounds.  Pulmonary:     Effort: Pulmonary effort is normal. No respiratory distress.     Breath sounds: No wheezing.  Abdominal:     Palpations: Abdomen is soft.     Tenderness: There is no abdominal tenderness. There is no guarding or rebound.  Musculoskeletal:     Cervical back: Normal range of motion.     Right lower leg: No edema.     Left lower leg: No edema.   Skin:    General: Skin is warm and dry.     Capillary Refill: Capillary refill takes less than 2 seconds.  Neurological:     Mental Status: She is alert. Mental status is at baseline.  Psychiatric:        Mood and Affect: Mood normal.        Behavior: Behavior normal.     ED Results / Procedures / Treatments   Labs (all labs ordered are listed, but only abnormal results are displayed) Labs Reviewed  BASIC METABOLIC PANEL - Abnormal; Notable for the following components:      Result Value   Potassium 3.4 (*)    CO2 21 (*)    All other components within normal limits  CBC  D-DIMER, QUANTITATIVE  I-STAT BETA HCG BLOOD, ED ([facility], [facility], [facility] ONLY)  TROPONIN I (HIGH SENSITIVITY)  TROPONIN I (HIGH SENSITIVITY)    EKG None  Radiology DG Chest 2 View  Result Date: 04/15/2023 CLINICAL DATA:  Chest pain EXAM: CHEST - 2 VIEW COMPARISON:  10/21/2021 FINDINGS: The heart size and mediastinal contours are within normal limits. Both lungs are clear. The visualized skeletal structures are unremarkable. IMPRESSION: No active cardiopulmonary disease. Electronically Signed   By: Alcide Clever M.D.   On: 04/15/2023 01:26    Procedures Procedures    Medications Ordered in ED Medications  ipratropium-albuterol (DUONEB) 0.5-2.5 (3) MG/3ML nebulizer solution 3 mL (3 mLs Nebulization Given 04/15/23 0346)  predniSONE (DELTASONE) tablet 60 mg (60 mg Oral Given 04/15/23 0344)  ketorolac (TORADOL) 30 MG/ML injection 30 mg (30 mg Intramuscular Given 04/15/23 0345)  oxyCODONE-acetaminophen (PERCOCET/ROXICET) 5-325 MG per tablet 1 tablet (1 tablet Oral Given 04/15/23 9604)    ED Course/ Medical Decision Making/ A&P                             Medical Decision Making Amount and/or Complexity of Data Reviewed Labs: ordered. Radiology: ordered.  Risk Prescription drug management.   This patient presents to the ED for concern of CP, this involves a number of treatment options, and is a complaint that  carries with it a moderate to high risk of complications and morbidity. A differential diagnosis was considered for the patient's symptoms which is discussed below:   The emergent causes of chest pain include: Acute coronary syndrome, tamponade, pericarditis/myocarditis, aortic dissection, pulmonary embolism, tension pneumothorax, pneumonia, and esophageal rupture.  I do not believe the patient has an emergent cause of chest pain, other urgent/non-acute  considerations include, but are not limited to: chronic angina, aortic stenosis, cardiomyopathy, mitral valve prolapse, pulmonary hypertension, aortic insufficiency, right ventricular hypertrophy, pleuritis, bronchitis, pneumothorax, tumor, gastroesophageal reflux disease (GERD), esophageal spasm, Mallory-Weiss syndrome, peptic ulcer disease, pancreatitis, functional gastrointestinal pain, cervical or thoracic disk disease or arthritis, shoulder arthritis, costochondritis, subacromial bursitis, anxiety or panic attack, herpes zoster, breast disorders, chest wall tumors, thoracic outlet syndrome, mediastinitis.    Co morbidities: Discussed in HPI   Brief History:  Patient is a 37 year old female with a past medical history significant for bipolar, insomnia, anxiety, STDs  She is present emergency room today with complaints of chest pressure that has been persistent for 6 days now.  She states that it is a pressure in the center of her chest.  She states on the third day of her symptoms she had 1 episode of nonbloody nonbilious emesis and some nausea preceding this however felt better after the episode of emesis.  She states that she is not feeling particularly short of breath and denies any jaw pain  She is a smoker and does smoke daily.  States that she works at [employer] and is on her feet majority of the day.  She notes that she had some swelling in her legs at the end of her shift last few days.  She was given a breathing treatment at  urgent care 4 days ago and states that she had some improvement temporarily with her symptoms.  No recent surgeries, hospitalization, long travel, hemoptysis, estrogen containing OCP, cancer history.  No unilateral leg swelling.  No history of PE or VTE.     EMR reviewed including pt PMHx, past surgical history and past visits to ER.   See HPI for more details   Lab Tests:   I personally reviewed all laboratory work and imaging. Metabolic panel without any acute abnormality specifically kidney function within normal limits and no significant electrolyte abnormalities. CBC without leukocytosis or significant anemia. Troponin x 2 within normal limits, D-dimer undetectably low, i-STAT hCG negative for pregnancy  Imaging Studies:  Chest x-ray unremarkable no infiltrate or pneumothorax    Cardiac Monitoring:  The patient was maintained on a cardiac monitor.  I personally viewed and interpreted the cardiac monitored which showed an underlying rhythm of: NSR EKG non-ischemic NSR   Medicines ordered:  I ordered medication including DuoNeb, Toradol, prednisone, Percocet for pain Reevaluation of the patient after these medicines showed that the patient improved I have reviewed the patients home medicines and have made adjustments as needed   Critical Interventions:     Consults/Attending Physician      Reevaluation:  After the interventions noted above I re-evaluated patient and found that they have :resolved   Social Determinants of Health:      Problem List / ED Course:  Chest pain now resolved.  Seems to be chest pressure has been ongoing for greater than a week at this point.  No hemoptysis no specific risk factors for PE, low risk on my evaluation, D-dimer negative.  Patient with no active chest pain is 37 and have a low suspicion for this being ACS with 2 normal troponins.  Could be related to her bronchospasm/asthma.  Return precautions discussed, she will  follow-up with primary care.   Dispostion:  After consideration of the diagnostic results and the patients response to treatment, I feel that the patent would benefit from patient follow-up.      Final Clinical Impression(s) / ED Diagnoses Final diagnoses:  Atypical chest pain    Rx / DC Orders ED Discharge Orders     None         Gailen Shelter, [place] 04/15/23 0543    Gilda Crease, MD 04/15/23 [phone]

## 2023-04-15 NOTE — ED Triage Notes (Signed)
Pt arrived via [facility] c/o chest pain 9/10 at present described as pressure. Pt states that it is a constant pressure.

## 2023-04-15 NOTE — Discharge Instructions (Addendum)
Your lab work is reassuring.  You do not have any evidence of a blood clot or pneumonia or any other new or concerning symptoms.  I recommend alternating Tylenol and ibuprofen as discussed below.  Follow-up with your primary care doctor.  Return to emergency room for any new or concerning symptoms as we discussed  Please use Tylenol or ibuprofen for pain.  You may use 600 mg ibuprofen every 6 hours or 1000 mg of Tylenol every 6 hours.  You may choose to alternate between the 2.  This would be most effective.  Not to exceed 4 g of Tylenol within 24 hours.  Not to exceed 3200 mg ibuprofen 24 hours.

## 2023-04-18 ENCOUNTER — Ambulatory Visit: Payer: No Typology Code available for payment source | Admitting: Obstetrics and Gynecology

## 2023-04-23 ENCOUNTER — Ambulatory Visit: Payer: No Typology Code available for payment source | Admitting: Obstetrics and Gynecology

## 2023-06-17 IMAGING — DX DG CHEST 1V PORT
1 series · 2 of 2 positions shown · non-contrast
Comparison: 05/20/2021

CLINICAL DATA: Cough, fever and chills

EXAM:
PORTABLE CHEST 1 VIEW

[Series 1: chest · 0.14mm/px · 2 of 2 slices shown]
[im 1/2]
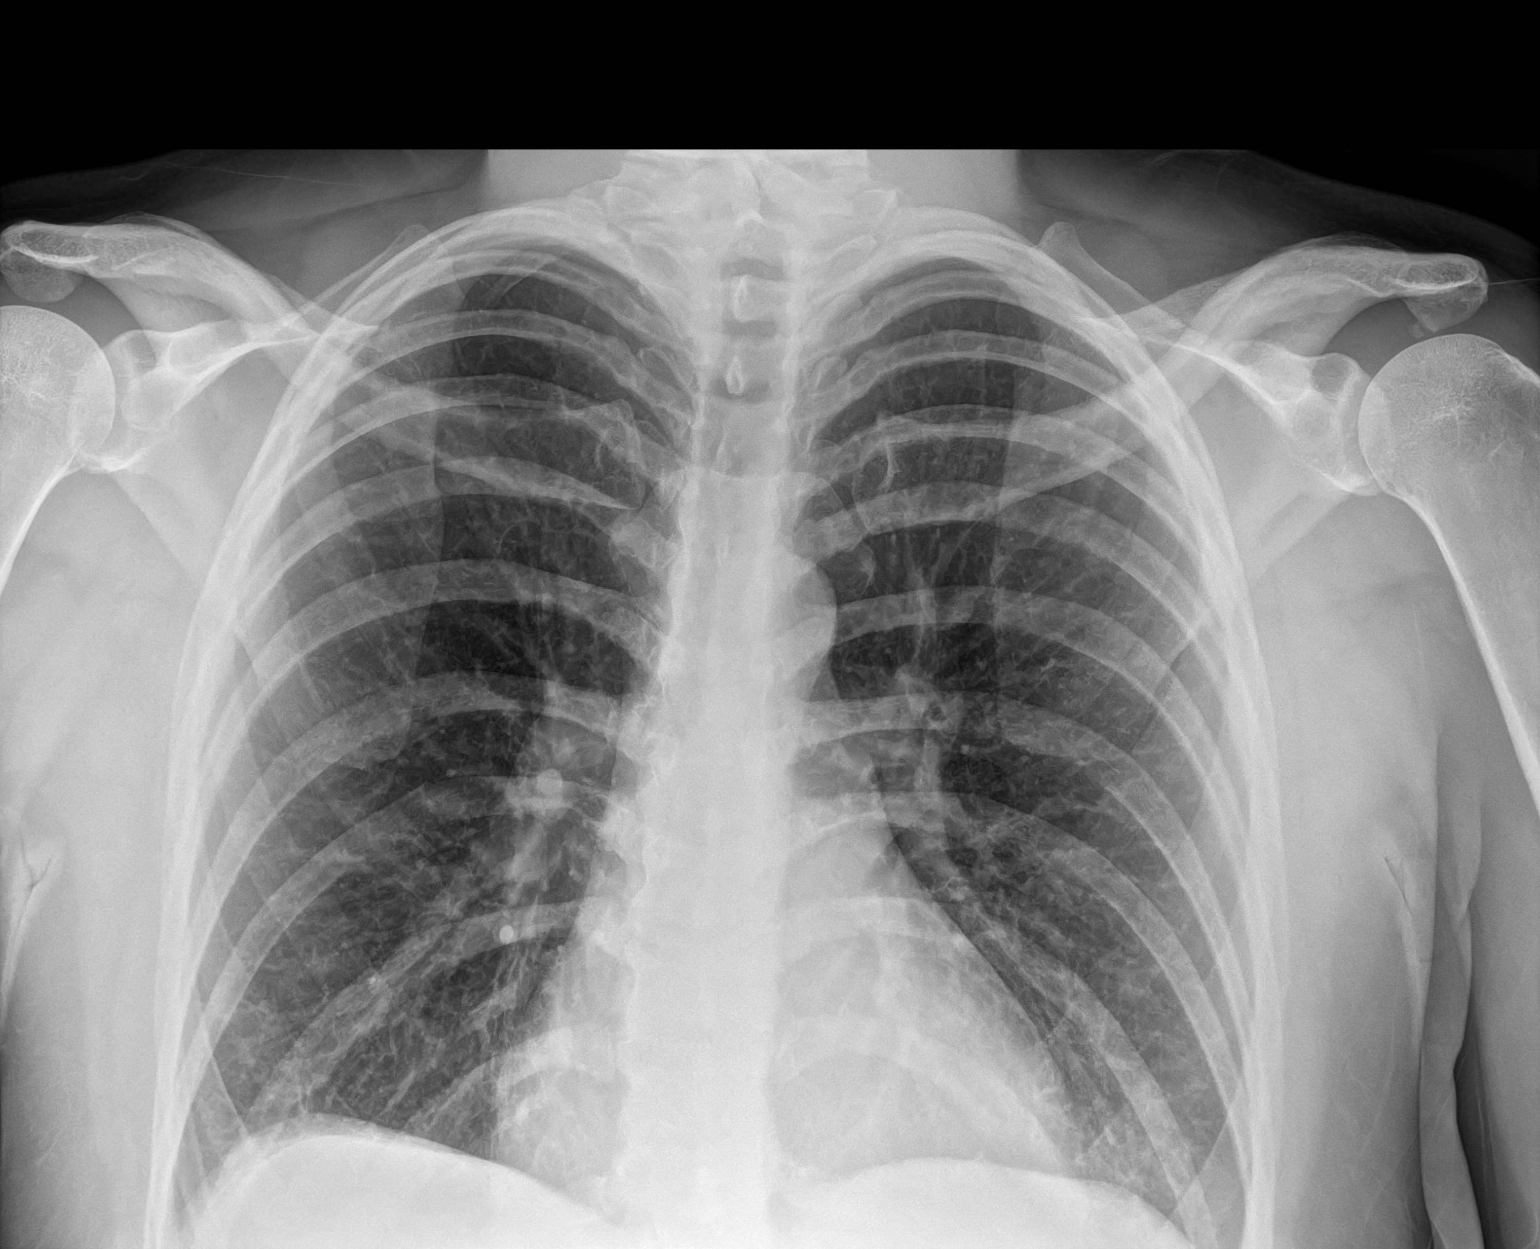
[im 2/2]
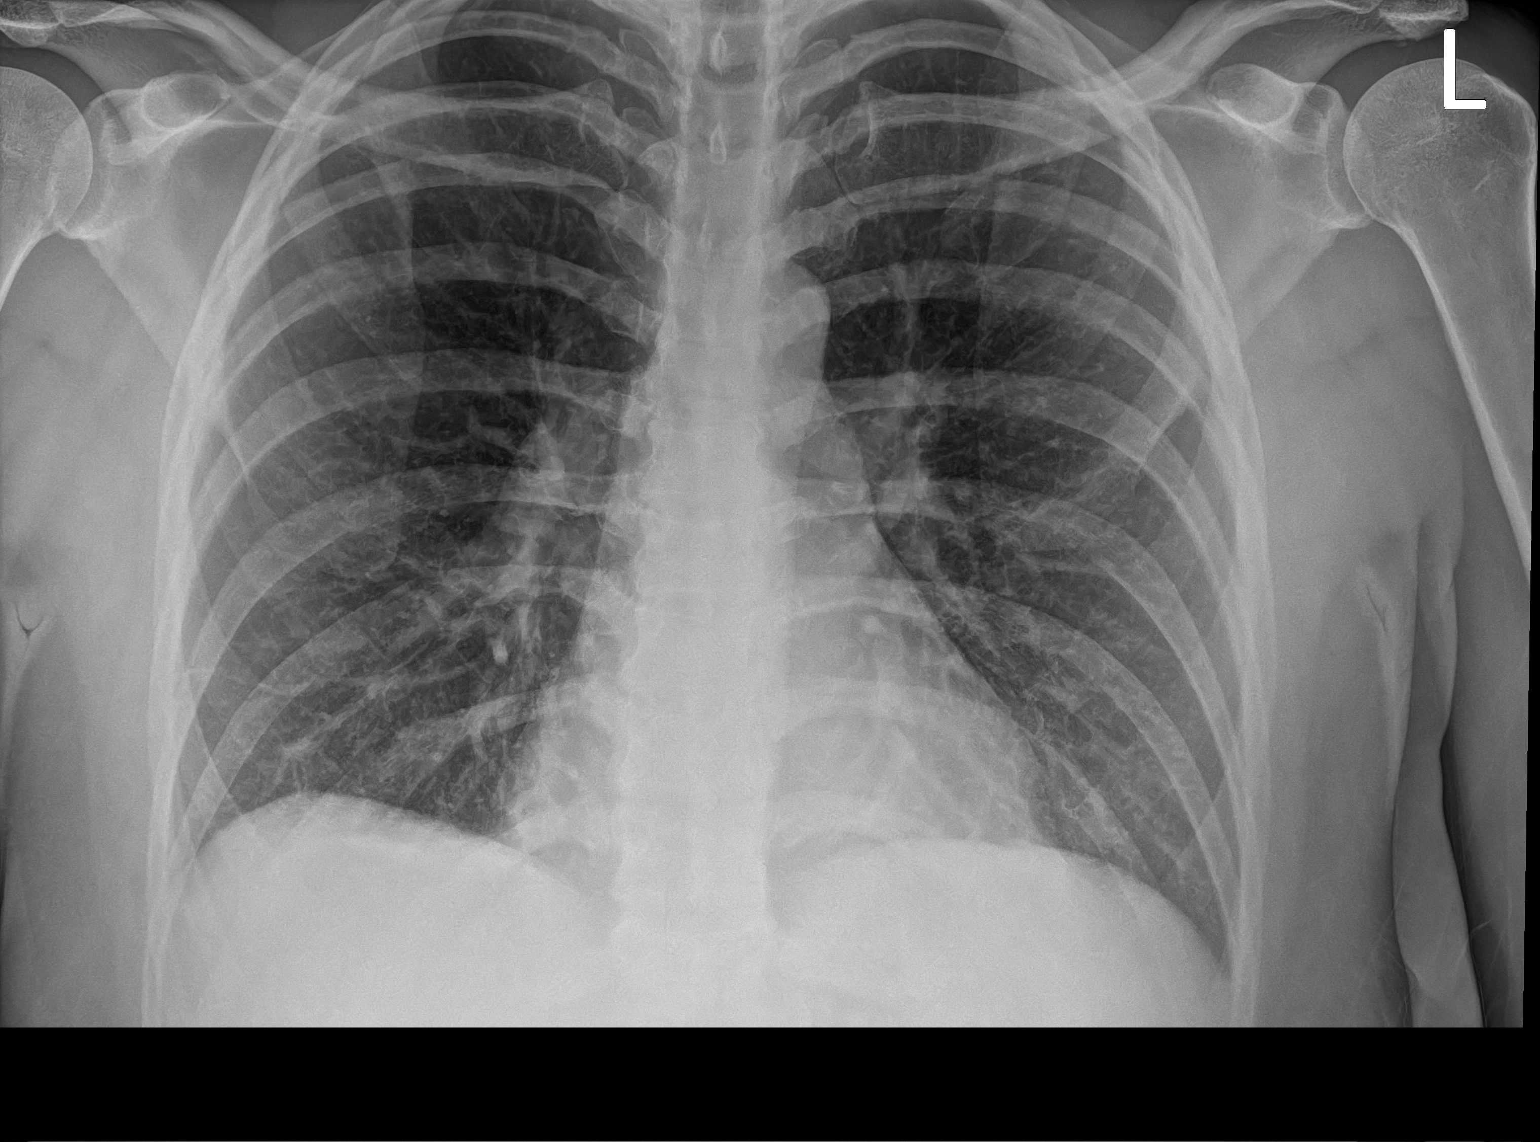

[2 of 2 positions shown; findings below may reference images not displayed]

FINDINGS: Heart and mediastinal shadows are normal. There may be central
bronchial thickening but there is no infiltrate, collapse or
effusion. No abnormal bone finding.
IMPRESSION: Possible bronchitis.  No consolidation or collapse.

## 2023-06-28 ENCOUNTER — Ambulatory Visit (HOSPITAL_COMMUNITY): Payer: No Typology Code available for payment source | Admitting: Student

## 2023-07-09 ENCOUNTER — Ambulatory Visit (INDEPENDENT_AMBULATORY_CARE_PROVIDER_SITE_OTHER): Payer: MEDICAID | Admitting: Clinical

## 2023-07-09 ENCOUNTER — Encounter (HOSPITAL_COMMUNITY): Payer: Self-pay

## 2023-07-09 DIAGNOSIS — F319 Bipolar disorder, unspecified: Secondary | ICD-10-CM | POA: Diagnosis not present

## 2023-07-09 NOTE — Progress Notes (Signed)
   THERAPIST PROGRESS NOTE Virtual Visit via Video Note  I connected with Claire Riley on 07/09/23 at  9:00 AM EDT by a video enabled telemedicine application and verified that I am speaking with the correct person using two identifiers.  Location: Patient: home Provider: office   I discussed the limitations of evaluation and management by telemedicine and the availability of in person appointments. The patient expressed understanding and agreed to proceed.   Follow Up Instructions: I discussed the assessment and treatment plan with the patient. The patient was provided an opportunity to ask questions and all were answered. The patient agreed with the plan and demonstrated an understanding of the instructions.   The patient was advised to call back or seek an in-person evaluation if the symptoms worsen or if the condition fails to improve as anticipated.   Session Time: 20 minutes  Participation Level: Active  Behavioral Response: CasualAlertAnxious  Type of Therapy: Individual Therapy  Treatment Goals addressed: Client will identify cognitive patterns and believes that support depression 1 times per session  ProgressTowards Goals: Progressing  Interventions: CBT and Supportive  Summary:  Claire Riley is a 37 y.o. female who presents for the scheduled appointment oriented x 5, appropriately dressed, and friendly.  Client denied hallucinations and delusions. Client reported on today she has been doing pretty well.  Client reported she has been working a job at [employer] but the environment has become too stressful and she has a plan b place of employment.  Client reported the job at [employer] has employees that are teenagers and its a lot of unnecessary drama.  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  Client reported it would be helpful as she and her husband are looking to move out of a family member's house into their own place.  Client  reported she has put her application in for section 8 housing.  Client reported otherwise her children are doing well.  Client reported aside from stresses about work and looking for another place to live she has been doing well. Evidence of progress towards goal:  Client is medication compliant and goal oriented 7 days/week.  Suicidal/Homicidal: Yeswithout intent/plan  Therapist Response:  Therapist began the appointment asking the client how she has been doing since last seen. Therapist used CBT to engage using active listening and positive emotional support. Therapist used CBT to ask the client open-ended questions about medication compliance compared to ongoing symptoms. Therapist used CBT to engage the client and positively reinforced her initiative of working on her personal goals to improve psychosocial stressors. Therapist used CBT ask the client to identify her progress with frequency of use with coping skills with continued practice in her daily activity.       Plan: Return again in 4 weeks.  Diagnosis: bipolar depression  Collaboration of Care: Patient refused AEB none requested by the client.  Patient/Guardian was advised Release of Information must be obtained prior to any record release in order to collaborate their care with an outside provider. Patient/Guardian was advised if they have not already done so to contact the registration department to sign all necessary forms in order for [place] to release information regarding their care.   Consent: Patient/Guardian gives verbal consent for treatment and assignment of benefits for services provided during this visit. Patient/Guardian expressed understanding and agreed to proceed.   Neena Rhymes Dorrene Bently, LCSW 07/09/2023

## 2023-08-01 ENCOUNTER — Encounter (HOSPITAL_COMMUNITY): Payer: Self-pay

## 2023-08-01 ENCOUNTER — Ambulatory Visit (HOSPITAL_COMMUNITY): Payer: No Typology Code available for payment source | Admitting: Clinical

## 2023-08-14 ENCOUNTER — Ambulatory Visit (HOSPITAL_COMMUNITY): Payer: No Typology Code available for payment source | Admitting: Student

## 2023-08-22 ENCOUNTER — Encounter (HOSPITAL_COMMUNITY): Payer: Self-pay

## 2023-08-22 ENCOUNTER — Ambulatory Visit (HOSPITAL_COMMUNITY): Payer: No Typology Code available for payment source | Admitting: Clinical

## 2023-09-03 ENCOUNTER — Ambulatory Visit (HOSPITAL_COMMUNITY): Payer: No Typology Code available for payment source | Admitting: Clinical

## 2023-09-03 ENCOUNTER — Encounter (HOSPITAL_COMMUNITY): Payer: Self-pay

## 2023-09-13 ENCOUNTER — Ambulatory Visit
Admission: EM | Admit: 2023-09-13 | Discharge: 2023-09-13 | Disposition: A | Discharge status: Home / Self Care | Payer: No Typology Code available for payment source | Attending: Internal Medicine | Admitting: Internal Medicine

## 2023-09-13 ENCOUNTER — Other Ambulatory Visit: Payer: Self-pay

## 2023-09-13 DIAGNOSIS — R300 Dysuria: Secondary | ICD-10-CM | POA: Diagnosis not present

## 2023-09-13 DIAGNOSIS — Z79899 Other long term (current) drug therapy: Secondary | ICD-10-CM | POA: Insufficient documentation

## 2023-09-13 DIAGNOSIS — N898 Other specified noninflammatory disorders of vagina: Secondary | ICD-10-CM

## 2023-09-13 DIAGNOSIS — F17210 Nicotine dependence, cigarettes, uncomplicated: Secondary | ICD-10-CM | POA: Insufficient documentation

## 2023-09-13 DIAGNOSIS — Z202 Contact with and (suspected) exposure to infections with a predominantly sexual mode of transmission: Secondary | ICD-10-CM | POA: Diagnosis not present

## 2023-09-13 DIAGNOSIS — Z113 Encounter for screening for infections with a predominantly sexual mode of transmission: Secondary | ICD-10-CM

## 2023-09-13 LAB — POCT URINALYSIS DIP (MANUAL ENTRY)
Bilirubin, UA: NEGATIVE
Blood, UA: NEGATIVE
Glucose, UA: NEGATIVE mg/dL
Ketones, POC UA: NEGATIVE mg/dL
Leukocytes, UA: NEGATIVE
Nitrite, UA: NEGATIVE
Protein Ur, POC: NEGATIVE mg/dL
Spec Grav, UA: 1.025 (ref 1.010–1.025)
Urobilinogen, UA: 0.2 U/dL
pH, UA: 6.5 (ref 5.0–8.0)

## 2023-09-13 MED ORDER — METRONIDAZOLE 500 MG PO TABS: 500.0000 mg | ORAL_TABLET | Freq: Two times a day (BID) | ORAL | 0 refills | Status: DC

## 2023-09-13 NOTE — ED Triage Notes (Addendum)
Patient presents with abdomen pain, discharge, burning when urinate, vaginal itching and odor x 1.5 weeks. No treatment used.

## 2023-09-13 NOTE — Discharge Instructions (Signed)
I have prescribed you an antibiotic to take for trichomonas exposure.  Vaginal swab is pending.  We will call when it results.  Follow-up with any persistent symptoms.

## 2023-09-13 NOTE — ED Provider Notes (Signed)
[facility]    CSN: [id number] Arrival date & time: 09/13/23  1032      History   Chief Complaint No chief complaint on file.   HPI Claire Riley is a 37 y.o. female.   Patient presents with approximately 1.5-week history of vaginal discharge, vaginal itching, urinary burning, vaginal odor.  Reports recent sexual partner told them that they had trichomonas.  Patient is not reporting any abdominal pain, flank pain, fever.  Last menstrual cycle was 09/03/2023.     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 Authorizing Provider  metroNIDAZOLE (FLAGYL) 500 MG tablet Take 1 tablet (500 mg total) by mouth 2 (two) times daily. 09/13/23  Yes Kamie Korber, Acie Fredrickson, FNP  albuterol (VENTOLIN HFA) 108 (90 Base) MCG/ACT inhaler Inhale 1-2 puffs into the lungs every 6 (six) hours as needed for wheezing or shortness of breath. 04/11/23   Barbette Merino, NP  amoxicillin (AMOXIL) 500 MG capsule Take 1 capsule (500 mg total) by mouth 3 (three) times daily. 11/08/22   Tomi Bamberger, PA-C  ARIPiprazole (ABILIFY) 15 MG tablet Take 1 tablet (15 mg total) by mouth daily. 11/23/22 11/23/23  [name], MD  buPROPion (WELLBUTRIN XL) 150 MG 24 hr tablet Take 1 tablet  (150 mg total) by mouth every morning. 11/23/22 11/23/23  [name], MD  busPIRone (BUSPAR) 7.5 MG tablet Take 1 tablet (7.5 mg total) by mouth 2 (two) times daily. 11/23/22 11/23/23  [name], MD  lidocaine (XYLOCAINE) 2 % solution Use as directed 15 mLs in the mouth or throat as needed for mouth pain. 08/14/21   Merrilee Jansky, MD  Melatonin 10 MG TABS Take 10 mg by mouth at bedtime. 10/05/21   Nwoko, Tommas Olp, PA  Multiple Vitamins-Minerals (MULTIVITAMIN WITH MINERALS) tablet Take 1 tablet by mouth daily.    [provider]  oseltamivir (TAMIFLU) 75 MG capsule Take 1 capsule (75 mg total) by mouth every 12 (twelve) hours. 11/08/22   Tomi Bamberger, PA-C  Prenatal MV-Min-Fe Fum-FA-DHA (PRENATAL/FOLIC ACID+DHA) 27-0.8-200 MG CAPS Take 1 tablet by mouth daily. 12/15/21   Worthy Rancher, MD    Family History Family History  Problem Relation Age of Onset   Hypertension Mother    Scoliosis Mother    Hypertension Father    Diabetes Father    COPD Father  Scoliosis Sister    Diabetes Paternal Grandmother     Social History Social History   Tobacco Use   Smoking status: Some Days    Current packs/day: 0.25    Types: Cigarettes   Smokeless tobacco: Never  Vaping Use   Vaping status: Never Used  Substance Use Topics   Alcohol use: No   Drug use: Not Currently    Types: Marijuana     Allergies   Tomato   Review of Systems Review of Systems Per HPI  Physical Exam Triage Vital Signs ED Triage Vitals  Encounter Vitals Group     BP 09/13/23 1139 122/79     Systolic BP Percentile --      Diastolic BP Percentile --      Pulse Rate 09/13/23 1139 86     Resp 09/13/23 1139 17     Temp 09/13/23 1139 98 F (36.7 C)     Temp Source 09/13/23 1139 Oral     SpO2 09/13/23 1139 99 %     Weight 09/13/23 1135 265 lb (120.2 kg)     Height 09/13/23 1135 6\' 2"  (1.88 m)     Head Circumference --      Peak Flow --      Pain Score 09/13/23 1135 8     Pain Loc  --      Pain Education --      Exclude from Growth Chart --    No data found.  Updated Vital Signs BP 122/79 (BP Location: Left Arm)   Pulse 86   Temp 98 F (36.7 C) (Oral)   Resp 17   Ht 6\' 2"  (1.88 m)   Wt 265 lb (120.2 kg)   LMP 09/03/2023   SpO2 99%   BMI 34.02 kg/m   Visual Acuity Right Eye Distance:   Left Eye Distance:   Bilateral Distance:    Right Eye Near:   Left Eye Near:    Bilateral Near:     Physical Exam Constitutional:      General: She is not in acute distress.    Appearance: Normal appearance. She is not toxic-appearing or diaphoretic.  HENT:     Head: Normocephalic and atraumatic.  Eyes:     Extraocular Movements: Extraocular movements intact.     Conjunctiva/sclera: Conjunctivae normal.  Pulmonary:     Effort: Pulmonary effort is normal.  Genitourinary:    Comments: Deferred with shared decision making. Self swab performed.  Neurological:     General: No focal deficit present.     Mental Status: She is alert and oriented to person, place, and time. Mental status is at baseline.  Psychiatric:        Mood and Affect: Mood normal.        Behavior: Behavior normal.        Thought Content: Thought content normal.        Judgment: Judgment normal.      UC Treatments / Results  Labs (all labs ordered are listed, but only abnormal results are displayed) Labs Reviewed  POCT URINALYSIS DIP (MANUAL ENTRY)  CERVICOVAGINAL ANCILLARY ONLY    EKG   Radiology No results found.  Procedures Procedures (including critical care time)  Medications Ordered in UC Medications - No data to display  Initial Impression / Assessment and Plan / UC Course  I have reviewed the triage vital signs and the nursing notes.  Pertinent labs & imaging results that were available during my care of the patient were  reviewed by me and considered in my medical decision making (see chart for details).     UA unremarkable.  Given confirmed trichomonas exposure,  will treat with metronidazole.  Advised no alcohol while taking this medication.  Cervicovaginal swab pending.  Awaiting results.  Advised strict follow-up precautions and safe sex practices.  Patient verbalized understanding and was agreeable with plan. Final Clinical Impressions(s) / UC Diagnoses   Final diagnoses:  Vaginal discharge  Screening examination for venereal disease  Trichomonas exposure  Dysuria     Discharge Instructions      I have prescribed you an antibiotic to take for trichomonas exposure.  Vaginal swab is pending.  We will call when it results.  Follow-up with any persistent symptoms.    ED Prescriptions     Medication Sig Dispense Auth. Provider   metroNIDAZOLE (FLAGYL) 500 MG tablet Take 1 tablet (500 mg total) by mouth 2 (two) times daily. 14 tablet [place], Acie Fredrickson, [place]      PDMP not reviewed this encounter.   Gustavus Bryant, [place] 09/13/23 1246

## 2023-09-16 ENCOUNTER — Telehealth (HOSPITAL_COMMUNITY): Payer: Self-pay | Admitting: Emergency Medicine

## 2023-09-16 LAB — CERVICOVAGINAL ANCILLARY ONLY
Bacterial Vaginitis (gardnerella): POSITIVE — AB
Candida Glabrata: NEGATIVE
Candida Vaginitis: POSITIVE — AB
Chlamydia: NEGATIVE
Comment: NEGATIVE
Comment: NEGATIVE
Comment: NEGATIVE
Comment: NEGATIVE
Comment: NEGATIVE
Comment: NORMAL
Neisseria Gonorrhea: NEGATIVE
Trichomonas: POSITIVE — AB

## 2023-09-16 MED ORDER — FLUCONAZOLE 150 MG PO TABS: 150.0000 mg | ORAL_TABLET | Freq: Once | ORAL | 0 refills | Status: AC

## 2023-09-16 NOTE — Telephone Encounter (Signed)
Diflucan for positive candida

## 2023-10-24 ENCOUNTER — Encounter: Payer: Self-pay | Admitting: Obstetrics and Gynecology

## 2023-10-25 ENCOUNTER — Encounter: Payer: Self-pay | Admitting: Obstetrics and Gynecology

## 2023-10-25 ENCOUNTER — Other Ambulatory Visit (HOSPITAL_COMMUNITY)
Admission: RE | Admit: 2023-10-25 | Discharge: 2023-10-25 | Disposition: A | Discharge status: Home / Self Care | Payer: No Typology Code available for payment source | Source: Ambulatory Visit | Attending: Obstetrics and Gynecology | Admitting: Obstetrics and Gynecology

## 2023-10-25 ENCOUNTER — Ambulatory Visit (INDEPENDENT_AMBULATORY_CARE_PROVIDER_SITE_OTHER): Payer: MEDICAID | Admitting: Obstetrics and Gynecology

## 2023-10-25 VITALS — BP 118/74 | HR 77 | Wt 268.0 lb

## 2023-10-25 DIAGNOSIS — Z0000 Encounter for general adult medical examination without abnormal findings: Secondary | ICD-10-CM | POA: Diagnosis not present

## 2023-10-25 DIAGNOSIS — N946 Dysmenorrhea, unspecified: Secondary | ICD-10-CM

## 2023-10-25 DIAGNOSIS — Z3169 Encounter for other general counseling and advice on procreation: Secondary | ICD-10-CM | POA: Diagnosis not present

## 2023-10-25 DIAGNOSIS — Z124 Encounter for screening for malignant neoplasm of cervix: Secondary | ICD-10-CM | POA: Diagnosis present

## 2023-10-25 DIAGNOSIS — Z7185 Encounter for immunization safety counseling: Secondary | ICD-10-CM | POA: Diagnosis not present

## 2023-10-25 DIAGNOSIS — Z23 Encounter for immunization: Secondary | ICD-10-CM | POA: Diagnosis not present

## 2023-10-25 DIAGNOSIS — Z113 Encounter for screening for infections with a predominantly sexual mode of transmission: Secondary | ICD-10-CM

## 2023-10-25 NOTE — Progress Notes (Signed)
ANNUAL EXAM Patient name: Claire Riley MRN [id number]  Date of birth: January 14, 1986 Chief Complaint:   preconception   History of Present Illness:   Joury Guider is a 37 y.o. [phone] female being seen today for a routine annual exam.   Current concerns: TTC x4 yrs. She and partner both have children from other relationships. She also reports irregular periods since age 23-12. Heavy bleeding during periods, saturating one large-sized pad every 1.5 hours. Currently has 10/10 pain during menses for which she takes Tylenol. She has not been on hormonal therapy since nexplanon removal 6 years ago.   Current birth control: TTC x4 yrs No LMP recorded.   Last Pap/Pap History:  2014: pap normal  Review of Systems:   Pertinent items are noted in HPI Denies any headaches, blurred vision, fatigue, shortness of breath, chest pain, abdominal pain, abnormal vaginal discharge/itching/odor/irritation, problems with periods, bowel movements, urination, or intercourse unless otherwise stated above.  Pertinent History Reviewed:  Reviewed past medical,surgical, social and family history.  Reviewed problem list, medications and allergies. Physical Assessment:   Vitals:   10/25/23 0851  BP: 118/74  Pulse: 77  Weight: 268 lb (121.6 kg)  Body mass index is 34.41 kg/m.   Physical Examination:  General appearance - well appearing, and in no distress Mental status - alert, oriented to person, place, and time Psych:  She has a normal mood and affect Skin - warm and dry, normal color, no suspicious lesions noted Chest - effort normal Heart - normal rate  Breasts - breasts appear normal, no suspicious masses, no skin or nipple changes or axillary nodes Abdomen - soft, nontender, nondistended, no masses or organomegaly Pelvic -  VULVA: normal appearing vulva with no masses, tenderness or lesions  VAGINA: normal appearing vagina with normal color and discharge, no lesions  CERVIX: normal appearing  cervix without discharge or lesions, no CMT Extremities:  No swelling or varicosities noted  Chaperone present for exam  No results found for this or any previous visit (from the past 24 hours).  Assessment & Plan:  Pasleigh was seen today for preconception .  Diagnoses and all orders for this visit:  1. Screening examination for STD (sexually transmitted disease) (Primary) - RPR+HBsAg+HCVAb+... - Cervicovaginal ancillary only( [facility])  2. Encounter for Papanicolaou smear of cervix - 2014: pap normal - Cervical cancer screening: Discussed guidelines. Pap with HPV done  3. Infertility counseling - Reviewed three main factors for fertility - ovulation, normal sperm and tubal patency - Ovulation: Check ovarian reserve with [facility] - Sperm: Slip given for semen analysis  - Tubal Patency: If all other testing is normal, we can check tubal patency via HSG. Reviewed what the procedure entails.  - Reviewed factors that most behaviors do and don't contribute to infertility - Offered referral to [facility] - pt will consider this and HSG.  4. Dysmenorrhea - US PELVIC COMPLETE WITH TRANSVAGINAL; Future - CBC - TSH Rfx on Abnormal to Free T4  5. HPV vaccine counseling - Gardasil: Has not yet had. Counseling provided and pt accepts - HPV 9-valent vaccine,Recombinat    Orders Placed This Encounter  Procedures   US PELVIC COMPLETE WITH TRANSVAGINAL   HPV 9-valent vaccine,Recombinat   RPR+HBsAg+HCVAb+...   Anti mullerian hormone   CBC   TSH Rfx on Abnormal to Free T4    Meds: No orders of the defined types were placed in this encounter.   Follow-up: Return in about 1 month (around 11/25/2023) for Follow up US  and labwork.  Ralene Muskrat, [place] 10/25/2023 11:37 AM

## 2023-10-26 LAB — TSH RFX ON ABNORMAL TO FREE T4: TSH: 0.9 u[IU]/mL (ref 0.450–4.500)

## 2023-10-28 LAB — CERVICOVAGINAL ANCILLARY ONLY
Chlamydia: NEGATIVE
Comment: NEGATIVE
Comment: NEGATIVE
Comment: NORMAL
Neisseria Gonorrhea: NEGATIVE
Trichomonas: NEGATIVE

## 2023-10-29 ENCOUNTER — Telehealth: Payer: Self-pay | Admitting: Obstetrics and Gynecology

## 2023-10-29 LAB — RPR+HBSAG+HCVAB+...
HIV Screen 4th Generation wRfx: NONREACTIVE
Hep C Virus Ab: NONREACTIVE
Hepatitis B Surface Ag: NEGATIVE
RPR Ser Ql: NONREACTIVE

## 2023-10-29 LAB — CBC
Hematocrit: 38.7 % (ref 34.0–46.6)
Hemoglobin: 12.1 g/dL (ref 11.1–15.9)
MCH: 26.7 pg (ref 26.6–33.0)
MCHC: 31.3 g/dL — ABNORMAL LOW (ref 31.5–35.7)
MCV: 85 fL (ref 79–97)
Platelets: 231 10*3/uL (ref 150–450)
RBC: 4.53 x10E6/uL (ref 3.77–5.28)
RDW: 13.2 % (ref 11.7–15.4)
WBC: 6 10*3/uL (ref 3.4–10.8)

## 2023-10-29 LAB — CYTOLOGY - PAP
Comment: NEGATIVE
Comment: NEGATIVE
Comment: NEGATIVE
Diagnosis: NEGATIVE
HPV 16: NEGATIVE
HPV 18 / 45: NEGATIVE
High risk HPV: POSITIVE — AB

## 2023-10-29 LAB — ANTI MULLERIAN HORMONE: ANTI-MULLERIAN HORMONE (AMH): 1.34 ng/mL

## 2023-10-29 NOTE — Telephone Encounter (Signed)
 Advised Pt [name] is OON and that [employer] will not cover if [name] does not cover. Advised of the estimated of $1964.00. Pt stated she doesn't have [name]. Pt asked about payment plan. Pt stated she will decide whether to cancal after contacting [name].

## 2023-11-01 ENCOUNTER — Ambulatory Visit (HOSPITAL_COMMUNITY): Admission: RE | Admit: 2023-11-01 | Payer: No Typology Code available for payment source | Source: Ambulatory Visit

## 2023-11-15 ENCOUNTER — Ambulatory Visit (HOSPITAL_COMMUNITY): Payer: No Typology Code available for payment source

## 2023-11-27 ENCOUNTER — Ambulatory Visit: Payer: No Typology Code available for payment source | Admitting: Obstetrics and Gynecology

## 2023-12-06 ENCOUNTER — Ambulatory Visit (HOSPITAL_COMMUNITY): Admission: RE | Admit: 2023-12-06 | Payer: No Typology Code available for payment source | Source: Ambulatory Visit

## 2024-01-28 ENCOUNTER — Ambulatory Visit
Admission: EM | Admit: 2024-01-28 | Discharge: 2024-01-28 | Disposition: A | Discharge status: Home / Self Care | Attending: Internal Medicine | Admitting: Internal Medicine

## 2024-01-28 ENCOUNTER — Telehealth: Payer: Self-pay | Admitting: Emergency Medicine

## 2024-01-28 DIAGNOSIS — N898 Other specified noninflammatory disorders of vagina: Secondary | ICD-10-CM | POA: Insufficient documentation

## 2024-01-28 DIAGNOSIS — R300 Dysuria: Secondary | ICD-10-CM | POA: Diagnosis not present

## 2024-01-28 DIAGNOSIS — R350 Frequency of micturition: Secondary | ICD-10-CM | POA: Insufficient documentation

## 2024-01-28 DIAGNOSIS — Z113 Encounter for screening for infections with a predominantly sexual mode of transmission: Secondary | ICD-10-CM | POA: Insufficient documentation

## 2024-01-28 LAB — POCT URINALYSIS DIP (MANUAL ENTRY)
Bilirubin, UA: NEGATIVE
Blood, UA: NEGATIVE
Glucose, UA: NEGATIVE mg/dL
Ketones, POC UA: NEGATIVE mg/dL
Leukocytes, UA: NEGATIVE
Nitrite, UA: NEGATIVE
Protein Ur, POC: NEGATIVE mg/dL
Spec Grav, UA: 1.025 (ref 1.010–1.025)
Urobilinogen, UA: 1 U/dL
pH, UA: 6.5 (ref 5.0–8.0)

## 2024-01-28 NOTE — ED Triage Notes (Signed)
 Pt presents with UTI sxs x 1 week. Symptoms are abdominal pain and frequent urination.   Pt is also wanting STD swab.

## 2024-01-28 NOTE — Discharge Instructions (Signed)
 Urine was clear.  Vaginal swab pending.  Will call when it results.  Refrain from sexual activity until test results and treatment are complete.

## 2024-01-28 NOTE — Telephone Encounter (Signed)
 Called pt to make aware that we cannot locate the cyto swab she collected today in office. Pt was asked to return to facility to recollect. Pt agreed and stated she will come back by sometime today to recollect the sample. No further action required.

## 2024-01-28 NOTE — ED Provider Notes (Signed)
 [facility]    CSN: [id number] Arrival date & time: 01/28/24  1035      History   Chief Complaint Chief Complaint  Patient presents with   Urinary Frequency   Exposure to STD    HPI Claire Riley is a 38 y.o. female.   Patient presents with dysuria, urinary frequency, yellow vaginal discharge, lower abdominal discomfort that started a few days ago.  Denies exposure to STD but patient has had unprotected sexual intercourse recently.  Last menstrual cycle was 01/20/2024.   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 Authorizing Provider  albuterol (VENTOLIN HFA) 108 (90 Base) MCG/ACT inhaler Inhale 1-2 puffs into the lungs every 6 (six) hours as needed for wheezing or shortness of breath. 04/11/23   Barbette Merino, NP  Melatonin 10 MG TABS Take 10 mg by mouth at bedtime. 10/05/21   Nwoko, Tommas Olp, PA  Multiple Vitamins-Minerals (MULTIVITAMIN WITH MINERALS) tablet Take 1 tablet by mouth daily.    [provider]  Prenatal MV-Min-Fe Fum-FA-DHA (PRENATAL/FOLIC ACID+DHA) 27-0.8-200 MG CAPS Take 1 tablet by mouth daily. 12/15/21   Worthy Rancher, MD    Family History Family History   Problem Relation Age of Onset   Hypertension Mother    Scoliosis Mother    Hypertension Father    Diabetes Father    COPD Father    Scoliosis Sister    Diabetes Paternal Grandmother     Social History Social History   Tobacco Use   Smoking status: Some Days    Current packs/day: 0.25    Types: Cigarettes   Smokeless tobacco: Never  Vaping Use   Vaping status: Never Used  Substance Use Topics   Alcohol use: No   Drug use: Not Currently    Types: Marijuana     Allergies   Tomato   Review of Systems Review of Systems Per HPI  Physical Exam Triage Vital Signs ED Triage Vitals  Encounter Vitals Group     BP 01/28/24 1248 111/70     Systolic BP Percentile --      Diastolic BP Percentile --      Pulse Rate 01/28/24 1248 78     Resp 01/28/24 1248 16     Temp 01/28/24 1248 97.6 F (36.4 C)     Temp  Source 01/28/24 1248 Oral     SpO2 01/28/24 1248 98 %     Weight --      Height --      Head Circumference --      Peak Flow --      Pain Score 01/28/24 1246 8     Pain Loc --      Pain Education --      Exclude from Growth Chart --    No data found.  Updated Vital Signs BP 111/70 (BP Location: Left Arm)   Pulse 78   Temp 97.6 F (36.4 C) (Oral)   Resp 16   LMP 01/20/2024   SpO2 98%   Visual Acuity Right Eye Distance:   Left Eye Distance:   Bilateral Distance:    Right Eye Near:   Left Eye Near:    Bilateral Near:     Physical Exam Constitutional:      General: She is not in acute distress.    Appearance: Normal appearance. She is not toxic-appearing or diaphoretic.  HENT:     Head: Normocephalic and atraumatic.  Eyes:     Extraocular Movements: Extraocular movements intact.     Conjunctiva/sclera: Conjunctivae normal.  Pulmonary:     Effort: Pulmonary effort is normal.  Genitourinary:    Comments: Deferred with shared decision making.  Self swab performed. Neurological:     General: No focal deficit present.     Mental Status: She is  alert and oriented to person, place, and time. Mental status is at baseline.  Psychiatric:        Mood and Affect: Mood normal.        Behavior: Behavior normal.        Thought Content: Thought content normal.        Judgment: Judgment normal.      UC Treatments / Results  Labs (all labs ordered are listed, but only abnormal results are displayed) Labs Reviewed  POCT URINALYSIS DIP (MANUAL ENTRY)  CERVICOVAGINAL ANCILLARY ONLY    EKG   Radiology No results found.  Procedures Procedures (including critical care time)  Medications Ordered in UC Medications - No data to display  Initial Impression / Assessment and Plan / UC Course  I have reviewed the triage vital signs and the nursing notes.  Pertinent labs & imaging results that were available during my care of the patient were reviewed by me and considered in my medical decision making (see chart for details).     UA unremarkable.  Cervicovaginal swab pending as I am most suspicious of some form of vaginitis.  Will await results prior to treatment.  Advised strict follow-up precautions.  Patient verbalized understanding and was agreeable with plan. Final Clinical Impressions(s) / UC Diagnoses   Final diagnoses:  Vaginal discharge  Dysuria  Urinary frequency  Screening examination for venereal disease     Discharge Instructions      Urine was clear.  Vaginal swab pending.  Will call when it results.  Refrain from sexual activity until test results and treatment are complete.    ED Prescriptions   None    PDMP not reviewed this encounter.   Gustavus Bryant, [place] 01/28/24 [phone]

## 2024-01-29 ENCOUNTER — Telehealth (HOSPITAL_COMMUNITY): Payer: Self-pay

## 2024-01-29 ENCOUNTER — Ambulatory Visit
Admission: EM | Admit: 2024-01-29 | Discharge: 2024-01-29 | Disposition: A | Discharge status: Home / Self Care | Attending: Nurse Practitioner | Admitting: Nurse Practitioner

## 2024-01-29 DIAGNOSIS — A549 Gonococcal infection, unspecified: Secondary | ICD-10-CM | POA: Diagnosis not present

## 2024-01-29 LAB — CERVICOVAGINAL ANCILLARY ONLY
Bacterial Vaginitis (gardnerella): POSITIVE — AB
Candida Glabrata: NEGATIVE
Candida Vaginitis: POSITIVE — AB
Chlamydia: NEGATIVE
Comment: NEGATIVE
Comment: NEGATIVE
Comment: NEGATIVE
Comment: NEGATIVE
Comment: NEGATIVE
Comment: NORMAL
Neisseria Gonorrhea: POSITIVE — AB
Trichomonas: NEGATIVE

## 2024-01-29 MED ORDER — METRONIDAZOLE 500 MG PO TABS: 500.0000 mg | ORAL_TABLET | Freq: Two times a day (BID) | ORAL | 0 refills | Status: AC

## 2024-01-29 MED ORDER — CEFTRIAXONE SODIUM 500 MG IJ SOLR
500.0000 mg | INTRAMUSCULAR | Status: DC
  Administered 2024-01-29: 500 mg via INTRAMUSCULAR

## 2024-01-29 MED ORDER — FLUCONAZOLE 150 MG PO TABS: 150.0000 mg | ORAL_TABLET | Freq: Once | ORAL | 0 refills | Status: AC

## 2024-01-29 NOTE — Telephone Encounter (Signed)
 Per protocol, pt will need to return to UC for Nurse Visit for Rocephin 500mg  IM.  Per protocol, pt requires tx with metronidazole and Diflucan. Reviewed with patient, verified pharmacy, prescription sent.

## 2024-01-29 NOTE — ED Triage Notes (Signed)
 Nurse Visit/Injection only per patient/protocol (see below):  Warren Danes, RN 01/29/2024  4:54 PM EDT Back to Top    Per protocol, pt will need to return to UC for Nurse Visit for Rocephin 500mg  IM. Per protocol, pt requires tx with metronidazole and Diflucan. Reviewed with patient, verified pharmacy, prescription sent. Contacted patient by phone.  Verified identity using two identifiers.  Provided positive result.  Reviewed safe sex practices, notifying partners, and refraining from sexual activities for 7 days from time of treatment.  Patient verified understanding, all questions answered.   [facility]HS notified.   No questions. No concerns.

## 2024-02-22 ENCOUNTER — Emergency Department (HOSPITAL_COMMUNITY)
Admission: EM | Admit: 2024-02-22 | Discharge: 2024-02-22 | Disposition: A | Discharge status: Home / Self Care | Attending: Emergency Medicine | Admitting: Emergency Medicine

## 2024-02-22 ENCOUNTER — Other Ambulatory Visit: Payer: Self-pay

## 2024-02-22 ENCOUNTER — Emergency Department (HOSPITAL_COMMUNITY)

## 2024-02-22 DIAGNOSIS — S0990XA Unspecified injury of head, initial encounter: Secondary | ICD-10-CM | POA: Diagnosis not present

## 2024-02-22 DIAGNOSIS — S3991XA Unspecified injury of abdomen, initial encounter: Secondary | ICD-10-CM | POA: Insufficient documentation

## 2024-02-22 DIAGNOSIS — Z23 Encounter for immunization: Secondary | ICD-10-CM | POA: Diagnosis not present

## 2024-02-22 DIAGNOSIS — S51802A Unspecified open wound of left forearm, initial encounter: Secondary | ICD-10-CM | POA: Diagnosis not present

## 2024-02-22 DIAGNOSIS — S51012A Laceration without foreign body of left elbow, initial encounter: Secondary | ICD-10-CM | POA: Diagnosis not present

## 2024-02-22 DIAGNOSIS — S299XXA Unspecified injury of thorax, initial encounter: Secondary | ICD-10-CM | POA: Insufficient documentation

## 2024-02-22 DIAGNOSIS — S59912A Unspecified injury of left forearm, initial encounter: Secondary | ICD-10-CM | POA: Diagnosis present

## 2024-02-22 DIAGNOSIS — T148XXA Other injury of unspecified body region, initial encounter: Secondary | ICD-10-CM

## 2024-02-22 LAB — I-STAT CHEM 8, ED
BUN: 8 mg/dL (ref 6–20)
Calcium, Ion: 1.1 mmol/L — ABNORMAL LOW (ref 1.15–1.40)
Chloride: 109 mmol/L (ref 98–111)
Creatinine, Ser: 1 mg/dL (ref 0.44–1.00)
Glucose, Bld: 111 mg/dL — ABNORMAL HIGH (ref 70–99)
HCT: 41 % (ref 36.0–46.0)
Hemoglobin: 13.9 g/dL (ref 12.0–15.0)
Potassium: 3.8 mmol/L (ref 3.5–5.1)
Sodium: 146 mmol/L — ABNORMAL HIGH (ref 135–145)
TCO2: 25 mmol/L (ref 22–32)

## 2024-02-22 LAB — CBC WITH DIFFERENTIAL/PLATELET
Abs Immature Granulocytes: 0.02 10*3/uL (ref 0.00–0.07)
Basophils Absolute: 0 10*3/uL (ref 0.0–0.1)
Basophils Relative: 0 %
Eosinophils Absolute: 0.1 10*3/uL (ref 0.0–0.5)
Eosinophils Relative: 1 %
HCT: 42.4 % (ref 36.0–46.0)
Hemoglobin: 13.5 g/dL (ref 12.0–15.0)
Immature Granulocytes: 0 %
Lymphocytes Relative: 37 %
Lymphs Abs: 2.3 10*3/uL (ref 0.7–4.0)
MCH: 27.4 pg (ref 26.0–34.0)
MCHC: 31.8 g/dL (ref 30.0–36.0)
MCV: 86.2 fL (ref 80.0–100.0)
Monocytes Absolute: 0.3 10*3/uL (ref 0.1–1.0)
Monocytes Relative: 4 %
Neutro Abs: 3.7 10*3/uL (ref 1.7–7.7)
Neutrophils Relative %: 58 %
Platelets: 209 10*3/uL (ref 150–400)
RBC: 4.92 MIL/uL (ref 3.87–5.11)
RDW: 14.2 % (ref 11.5–15.5)
WBC: 6.4 10*3/uL (ref 4.0–10.5)
nRBC: 0 % (ref 0.0–0.2)

## 2024-02-22 LAB — POC URINE PREG, ED: Preg Test, Ur: NEGATIVE

## 2024-02-22 LAB — TYPE AND SCREEN
ABO/RH(D): B POS
Antibody Screen: NEGATIVE

## 2024-02-22 LAB — HCG, SERUM, QUALITATIVE: Preg, Serum: NEGATIVE

## 2024-02-22 MED ORDER — MELOXICAM 15 MG PO TABS: 15.0000 mg | ORAL_TABLET | Freq: Every day | ORAL | 0 refills | Status: AC

## 2024-02-22 MED ORDER — IOHEXOL 350 MG/ML SOLN
100.0000 mL | Freq: Once | INTRAVENOUS | Status: AC | PRN
  Administered 2024-02-22: 100 mL via INTRAVENOUS

## 2024-02-22 MED ORDER — TETANUS-DIPHTH-ACELL PERTUSSIS 5-2.5-18.5 LF-MCG/0.5 IM SUSY
0.5000 mL | PREFILLED_SYRINGE | Freq: Once | INTRAMUSCULAR | Status: AC
  Administered 2024-02-22: 0.5 mL via INTRAMUSCULAR
  Filled 2024-02-22: qty 0.5

## 2024-02-22 MED ORDER — CEPHALEXIN 500 MG PO CAPS: 500.0000 mg | ORAL_CAPSULE | Freq: Four times a day (QID) | ORAL | 0 refills | Status: DC

## 2024-02-22 MED ORDER — KETOROLAC TROMETHAMINE 30 MG/ML IJ SOLN
30.0000 mg | Freq: Once | INTRAMUSCULAR | Status: AC
  Administered 2024-02-22: 30 mg via INTRAVENOUS
  Filled 2024-02-22: qty 1

## 2024-02-22 MED ORDER — CEFAZOLIN SODIUM-DEXTROSE 2-4 GM/100ML-% IV SOLN
2.0000 g | Freq: Once | INTRAVENOUS | Status: AC
  Administered 2024-02-22: 2 g via INTRAVENOUS
  Filled 2024-02-22: qty 100

## 2024-02-22 MED ORDER — LIDOCAINE-EPINEPHRINE (PF) 2 %-1:200000 IJ SOLN
20.0000 mL | Freq: Once | INTRAMUSCULAR | Status: DC
  Filled 2024-02-22: qty 20

## 2024-02-22 NOTE — ED Notes (Signed)
 ED Provider at bedside.

## 2024-02-22 NOTE — ED Notes (Signed)
 Pt still in room waiting for ride

## 2024-02-22 NOTE — ED Provider Notes (Signed)
.  Laceration Repair  Date/Time: 02/22/2024 4:42 AM  Performed by: Juanetta Nordmann, PA Authorized by: Juanetta Nordmann, PA   Consent:    Consent obtained:  Verbal   Consent given by:  Patient   Risks, benefits, and alternatives were discussed: yes     Risks discussed:  Infection, pain, need for additional repair, poor cosmetic result and poor wound healing   Alternatives discussed:  No treatment and delayed treatment Universal protocol:    Procedure explained and questions answered to patient or proxy's satisfaction: yes     Relevant documents present and verified: yes     Test results available: yes     Imaging studies available: yes     Immediately prior to procedure, a time out was called: yes     Patient identity confirmed:  Verbally with patient, hospital-assigned identification number and arm band Anesthesia:    Anesthesia method:  None Laceration details:    Location:  Shoulder/arm   Shoulder/arm location:  L elbow   Length (cm):  6   Depth (mm):  2 Pre-procedure details:    Preparation:  Patient was prepped and draped in usual sterile fashion and imaging obtained to evaluate for foreign bodies Exploration:    Limited defect created (wound extended): no     Hemostasis achieved with:  Direct pressure   Imaging obtained: x-ray     Imaging outcome: foreign body not noted     Wound exploration: wound explored through full range of motion and entire depth of wound visualized     Wound extent: no signs of injury, no nerve damage, no tendon damage, no underlying fracture and no vascular damage     Contaminated: no   Treatment:    Area cleansed with:  Povidone-iodine and saline   Amount of cleaning:  Standard   Irrigation solution:  Sterile saline   Irrigation method:  Pressure wash   Visualized foreign bodies/material removed: yes     Debridement:  None   Undermining:  None   Scar revision: no   Skin repair:    Repair method:  Staples   Number of staples:   7 Approximation:    Approximation:  Close Repair type:    Repair type:  Simple Post-procedure details:    Dressing:  Non-adherent dressing and sterile dressing   Procedure completion:  Tolerated well, no immediate complications     Juanetta Nordmann, PA 02/22/24 0451    Palumbo, April, MD 02/22/24 0500
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
## 2024-03-08 ENCOUNTER — Encounter: Payer: Self-pay | Admitting: Emergency Medicine

## 2024-03-08 ENCOUNTER — Other Ambulatory Visit: Payer: Self-pay

## 2024-03-08 ENCOUNTER — Ambulatory Visit
Admission: EM | Admit: 2024-03-08 | Discharge: 2024-03-08 | Disposition: A | Discharge status: Home / Self Care | Payer: MEDICAID

## 2024-03-08 DIAGNOSIS — S41112D Laceration without foreign body of left upper arm, subsequent encounter: Secondary | ICD-10-CM | POA: Diagnosis not present

## 2024-03-08 NOTE — ED Triage Notes (Signed)
 Pt here for staple removal to left arm; wound healed

## 2024-03-14 ENCOUNTER — Other Ambulatory Visit: Payer: Self-pay

## 2024-03-14 ENCOUNTER — Emergency Department (HOSPITAL_COMMUNITY): Payer: MEDICAID

## 2024-03-14 ENCOUNTER — Emergency Department (HOSPITAL_COMMUNITY)
Admission: EM | Admit: 2024-03-14 | Discharge: 2024-03-14 | Disposition: A | Discharge status: Home / Self Care | Payer: MEDICAID | Attending: Emergency Medicine | Admitting: Emergency Medicine

## 2024-03-14 DIAGNOSIS — H538 Other visual disturbances: Secondary | ICD-10-CM | POA: Diagnosis not present

## 2024-03-14 DIAGNOSIS — R519 Headache, unspecified: Secondary | ICD-10-CM | POA: Insufficient documentation

## 2024-03-14 LAB — CBC
HCT: 38 % (ref 36.0–46.0)
Hemoglobin: 12.2 g/dL (ref 12.0–15.0)
MCH: 27.6 pg (ref 26.0–34.0)
MCHC: 32.1 g/dL (ref 30.0–36.0)
MCV: 86 fL (ref 80.0–100.0)
Platelets: 241 10*3/uL (ref 150–400)
RBC: 4.42 MIL/uL (ref 3.87–5.11)
RDW: 13.3 % (ref 11.5–15.5)
WBC: 8 10*3/uL (ref 4.0–10.5)
nRBC: 0 % (ref 0.0–0.2)

## 2024-03-14 LAB — BASIC METABOLIC PANEL WITH GFR
Anion gap: 9 (ref 5–15)
BUN: 11 mg/dL (ref 6–20)
CO2: 26 mmol/L (ref 22–32)
Calcium: 9.1 mg/dL (ref 8.9–10.3)
Chloride: 105 mmol/L (ref 98–111)
Creatinine, Ser: 0.79 mg/dL (ref 0.44–1.00)
GFR, Estimated: >60 mL/min (ref >60)
Glucose, Bld: 103 mg/dL — ABNORMAL HIGH (ref 70–99)
Potassium: 3.5 mmol/L (ref 3.5–5.1)
Sodium: 140 mmol/L (ref 135–145)

## 2024-03-14 LAB — HCG, SERUM, QUALITATIVE: Preg, Serum: NEGATIVE

## 2024-03-14 MED ORDER — DIPHENHYDRAMINE HCL 50 MG/ML IJ SOLN
12.5000 mg | Freq: Once | INTRAMUSCULAR | Status: AC
  Administered 2024-03-14: 12.5 mg via INTRAVENOUS
  Filled 2024-03-14: qty 1

## 2024-03-14 MED ORDER — SODIUM CHLORIDE 0.9 % IV BOLUS
1000.0000 mL | Freq: Once | INTRAVENOUS | Status: AC
  Administered 2024-03-14: 1000 mL via INTRAVENOUS

## 2024-03-14 MED ORDER — CETIRIZINE-PSEUDOEPHEDRINE ER 5-120 MG PO TB12: 1.0000 | ORAL_TABLET | Freq: Every day | ORAL | 0 refills | Status: AC

## 2024-03-14 MED ORDER — ACETAMINOPHEN 500 MG PO TABS
1000.0000 mg | ORAL_TABLET | Freq: Once | ORAL | Status: AC
  Administered 2024-03-14: 1000 mg via ORAL
  Filled 2024-03-14: qty 2

## 2024-03-14 MED ORDER — METOCLOPRAMIDE HCL 5 MG/ML IJ SOLN
10.0000 mg | Freq: Once | INTRAMUSCULAR | Status: AC
  Administered 2024-03-14: 10 mg via INTRAVENOUS
  Filled 2024-03-14: qty 2

## 2024-03-14 NOTE — ED Provider Notes (Signed)
 [place] EMERGENCY DEPARTMENT AT [facility] Provider Note   CSN: [id number] Arrival date & time: 03/14/24  4098     History  Chief Complaint  Patient presents with   Headache   HPI Claire Riley is a 38 y.o. female with history of bipolar depression, GAD presenting for headache.  Started 2 weeks ago.  Started gradually has progressively worsened.  Headache pain is a bandlike pain across the forehead and behind the eyes.  Does endorse sensitivity to light and sound and states occasionally she has visual blurriness in both eyes.  Denies fever denies nuchal rigidity.  Denies thunderclap symptoms.  States she has been lightheaded as well.  Denies nausea vomiting.  States she has had migraines in the past but this is "much worse".   Headache      Home Medications Prior to Admission medications   Medication Sig Start Date End Date Taking? Authorizing Provider  cetirizine -pseudoephedrine  (ZYRTEC -D) 5-120 MG tablet Take 1 tablet by mouth daily. 03/14/24  Yes Sreshta Cressler K, PA-C  albuterol  (VENTOLIN  HFA) 108 (90 Base) MCG/ACT inhaler Inhale 1-2 puffs into the lungs every 6 (six) hours as needed for wheezing or shortness of breath. 04/11/23   Gregoria Leas, NP  cephALEXin  (KEFLEX ) 500 MG capsule Take 1 capsule (500 mg total) by mouth 4 (four) times daily. 02/22/24   Palumbo, April, MD  cephALEXin  (KEFLEX ) 500 MG capsule Take 1 capsule (500 mg total) by mouth 4 (four) times daily. 02/22/24   Palumbo, April, MD  Melatonin 10 MG TABS Take 10 mg by mouth at bedtime. 10/05/21   Nwoko, Uchenna E, PA  meloxicam  (MOBIC ) 15 MG tablet Take 1 tablet (15 mg total) by mouth daily. 02/22/24   Palumbo, April, MD  Multiple Vitamins-Minerals (MULTIVITAMIN WITH MINERALS) tablet Take 1 tablet by mouth daily.    [provider]  Prenatal MV-Min-Fe Fum-FA-DHA (PRENATAL/FOLIC ACID +DHA) 27-0.8-200 MG CAPS Take 1 tablet by mouth daily. 12/15/21   Rosalita Combe, MD      Allergies     Tomato    Review of Systems   Review of Systems  Neurological:  Positive for headaches.    Physical Exam Updated Vital Signs BP 112/61   Pulse 88   Temp (!) 97.4 F (36.3 C) (Oral)   Resp 18   Ht 6\' 2"  (1.88 m)   Wt 120.2 kg   SpO2 99%   BMI 34.02 kg/m  Physical Exam Vitals and nursing note reviewed.  HENT:     Head: Normocephalic and atraumatic.     Mouth/Throat:     Mouth: Mucous membranes are moist.  Eyes:     General:        Right eye: No discharge.        Left eye: No discharge.     Conjunctiva/sclera: Conjunctivae normal.  Cardiovascular:     Rate and Rhythm: Normal rate and regular rhythm.     Pulses: Normal pulses.     Heart sounds: Normal heart sounds.  Pulmonary:     Effort: Pulmonary effort is normal.     Breath sounds: Normal breath sounds.  Abdominal:     General: Abdomen is flat.     Palpations: Abdomen is soft.  Skin:    General: Skin is warm and dry.  Neurological:     General: No focal deficit present.     Comments: GCS 15. Speech is goal oriented. No deficits appreciated to CN III-XII; symmetric eyebrow raise, no facial drooping, tongue midline.  Patient has equal grip strength bilaterally with 5/5 strength against resistance in all major muscle groups bilaterally. Sensation to light touch intact. Patient moves extremities without ataxia. Normal finger-nose-finger. Patient ambulatory with steady gait.  Psychiatric:        Mood and Affect: Mood normal.     ED Results / Procedures / Treatments   Labs (all labs ordered are listed, but only abnormal results are displayed) Labs Reviewed  BASIC METABOLIC PANEL WITH GFR - Abnormal; Notable for the following components:      Result Value   Glucose, Bld 103 (*)    All other components within normal limits  HCG, SERUM, QUALITATIVE  CBC    EKG None  Radiology CT HEAD WO CONTRAST Result Date: 03/14/2024 CLINICAL DATA:  Headache, neuro deficit. "Pt c/o intermittent HA for 2x weeks. States they  get so bad they get dizzy and end up falling down. Pain is mainly in sinus region. Pt states minor improvement w/Excedrin migraine:" EXAM: CT HEAD WITHOUT CONTRAST TECHNIQUE: Contiguous axial images were obtained from the base of the skull through the vertex without intravenous contrast. RADIATION DOSE REDUCTION: This exam was performed according to the departmental dose-optimization program which includes automated exposure control, adjustment of the mA and/or kV according to patient size and/or use of iterative reconstruction technique. COMPARISON:  CT head 02/22/2024 FINDINGS: Brain: No evidence of large-territorial acute infarction. No parenchymal hemorrhage. No mass lesion. No extra-axial collection. No mass effect or midline shift. No hydrocephalus. Basilar cisterns are patent. Vascular: No hyperdense vessel. Skull: No acute fracture or focal lesion. Sinuses/Orbits: Paranasal sinuses and mastoid air cells are clear. The orbits are unremarkable. Other: None. IMPRESSION: No acute intracranial abnormality. Electronically Signed   By: Morgane  Naveau M.D.   On: 03/14/2024 09:51    Procedures Procedures    Medications Ordered in ED Medications  sodium chloride  0.9 % bolus 1,000 mL (1,000 mLs Intravenous New Bag/Given 03/14/24 1000)  metoCLOPramide  (REGLAN ) injection 10 mg (10 mg Intravenous Given 03/14/24 1001)  diphenhydrAMINE  (BENADRYL ) injection 12.5 mg (12.5 mg Intravenous Given 03/14/24 1001)  acetaminophen  (TYLENOL ) tablet 1,000 mg (1,000 mg Oral Given 03/14/24 1000)    ED Course/ Medical Decision Making/ A&P Clinical Course as of 03/14/24 1255  Sat Mar 14, 2024  1154 CT HEAD WO CONTRAST [JR]    Clinical Course User Index [JR] Janalee Mcmurray, PA-C                                 Medical Decision Making Amount and/or Complexity of Data Reviewed Labs: ordered. Radiology: ordered. Decision-making details documented in ED Course.  Risk OTC drugs. Prescription drug  management.   Initial Impression and Ddx 38 year old well-appearing female present for headache.  Exam was unremarkable.  DDx includes stroke, hypertensive emergency, meningitis, complicated migraine, electrolyte derangement, orthostatics, other. Patient PMH that increases complexity of ED encounter:  h/o migraines  Interpretation of Diagnostics - I independent reviewed and interpreted the labs as followed: mild hyperglycemia (103)  - I independently visualized the following imaging with scope of interpretation limited to determining acute life threatening conditions related to emergency care: CT head, which revealed no acute findings  Patient Reassessment and Ultimate Disposition/Management On reassessment, stated that her headache had completely resolved.  She was ambulatory and drinking water without any complication.  Suspect this is likely a migraine.  Discussed supportive care at home and follow-up with her PCP.  Discussed return precautions.  Discharged.  Also per patient request sent Zyrtec  to her pharmacy.  Patient management required discussion with the following services or consulting groups:  None  Complexity of Problems Addressed Acute complicated illness or Injury  Additional Data Reviewed and Analyzed Further history obtained from: Past medical history and medications listed in the EMR and Prior ED visit notes  Patient Encounter Risk Assessment Consideration of hospitalization         Final Clinical Impression(s) / ED Diagnoses Final diagnoses:  Nonintractable headache, unspecified chronicity pattern, unspecified headache type    Rx / DC Orders ED Discharge Orders          Ordered    cetirizine -pseudoephedrine  (ZYRTEC -D) 5-120 MG tablet  Daily        03/14/24 1254              Janalee Mcmurray, PA-C 03/14/24 1255    Wynetta Heckle, MD 03/18/24 1657

## 2024-03-14 NOTE — ED Notes (Signed)
Pt stepped out to get phone

## 2024-03-14 NOTE — Discharge Instructions (Addendum)
 Evaluation was overall reassuring.  Suspect this was a migraine.  Please continue assertive hydration at home and you can take Tylenol  and ibuprofen  as well as caffeine at home to help with your headache.  I sent Zyrtec to your pharmacy as well as your headache could be related to allergies.  Please follow-up with your PCP.  If you have worsening headache, visual disturbance, neck rigidity, fever or any other concerning symptom please return to the ED for further evaluation.

## 2024-03-14 NOTE — ED Triage Notes (Signed)
 Pt c/o intermittent HA for 2x weeks. States they get so bad they get dizzy and end up falling down. Pain is mainly in sinus region.  Pt states minor improvement w/Excedrin migraine

## 2024-03-16 ENCOUNTER — Encounter: Payer: Self-pay | Admitting: Physician Assistant

## 2024-03-16 NOTE — ED Provider Notes (Signed)
 [facility]    CSN: [id number] Arrival date & time: 03/08/24  0844      History   Chief Complaint Chief Complaint  Patient presents with   Suture / Staple Removal    HPI Claire Riley is a 38 y.o. female.   Patient presented to urgent care office for staple removal after left arm.  She reports wound has healed nicely without any drainage or significant swelling or pain.  She is continuing antibiotics as prescribed.  The history is provided by the patient.  Suture / Staple Removal Pertinent negatives include no abdominal pain and no shortness of breath.    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 Authorizing Provider  albuterol  (VENTOLIN  HFA) 108 (90 Base) MCG/ACT inhaler Inhale 1-2 puffs into the lungs every 6 (six) hours as needed for wheezing or shortness of breath. 04/11/23   Gregoria Leas, NP  cephALEXin  (KEFLEX ) 500 MG capsule Take 1 capsule (500 mg total) by mouth 4 (four) times daily. 02/22/24   Palumbo, April, MD  cephALEXin  (KEFLEX ) 500 MG capsule Take 1 capsule (500 mg total) by mouth 4 (four) times daily. 02/22/24   Palumbo, April, MD  cetirizine -pseudoephedrine  (ZYRTEC -D) 5-120 MG tablet Take 1 tablet by mouth daily. 03/14/24    Robinson, John K, PA-C  Melatonin 10 MG TABS Take 10 mg by mouth at bedtime. 10/05/21   Nwoko, Uchenna E, PA  meloxicam  (MOBIC ) 15 MG tablet Take 1 tablet (15 mg total) by mouth daily. 02/22/24   Palumbo, April, MD  Multiple Vitamins-Minerals (MULTIVITAMIN WITH MINERALS) tablet Take 1 tablet by mouth daily.    [provider]  Prenatal MV-Min-Fe Fum-FA-DHA (PRENATAL/FOLIC ACID +DHA) 27-0.8-200 MG CAPS Take 1 tablet by mouth daily. 12/15/21   Rosalita Combe, MD    Family History Family History  Problem Relation Age of Onset   Hypertension Mother    Scoliosis Mother    Hypertension Father    Diabetes Father    COPD Father    Scoliosis Sister    Diabetes Paternal Grandmother     Social History Social History   Tobacco Use   Smoking status: Some Days    Current packs/day: 0.25    Types: Cigarettes   Smokeless tobacco: Never  Vaping Use   Vaping status: Never Used  Substance Use Topics   Alcohol use: No   Drug  use: Not Currently    Types: Marijuana     Allergies   Tomato   Review of Systems Review of Systems  Constitutional:  Negative for chills and fever.  Eyes:  Negative for discharge and redness.  Respiratory:  Negative for shortness of breath.   Gastrointestinal:  Negative for abdominal pain, nausea and vomiting.  Skin:  Positive for wound. Negative for color change.     Physical Exam Triage Vital Signs ED Triage Vitals [03/08/24 0903]  Encounter Vitals Group     BP 109/71     Systolic BP Percentile      Diastolic BP Percentile      Pulse Rate 79     Resp 18     Temp 97.7 F (36.5 C)     Temp Source Oral     SpO2 95 %     Weight      Height      Head Circumference      Peak Flow      Pain Score 0     Pain Loc      Pain Education      Exclude from Growth Chart    No data found.  Updated Vital Signs BP 109/71 (BP Location: Left Arm)   Pulse 79   Temp 97.7 F (36.5 C) (Oral)   Resp 18   SpO2 95%   Visual Acuity Right Eye  Distance:   Left Eye Distance:   Bilateral Distance:    Right Eye Near:   Left Eye Near:    Bilateral Near:     Physical Exam Vitals and nursing note reviewed.  Constitutional:      General: She is not in acute distress.    Appearance: Normal appearance. She is not ill-appearing.  HENT:     Head: Normocephalic and atraumatic.  Eyes:     Conjunctiva/sclera: Conjunctivae normal.  Cardiovascular:     Rate and Rhythm: Normal rate.  Pulmonary:     Effort: Pulmonary effort is normal. No respiratory distress.  Skin:    Comments: 7 staples intact to wound to left arm without drainage or bleeding  Neurological:     Mental Status: She is alert.  Psychiatric:        Mood and Affect: Mood normal.        Behavior: Behavior normal.        Thought Content: Thought content normal.      UC Treatments / Results  Labs (all labs ordered are listed, but only abnormal results are displayed) Labs Reviewed - No data to display  EKG   Radiology No results found.  Procedures Procedures (including critical care time)  Medications Ordered in UC Medications - No data to display  Initial Impression / Assessment and Plan / UC Course  I have reviewed the triage vital signs and the nursing notes.  Pertinent labs & imaging results that were available during my care of the patient were reviewed by me and considered in my medical decision making (see chart for details).     Staples removed in office without complication. Advised follow up with any further concerns.   Final Clinical Impressions(s) / UC Diagnoses   Final diagnoses:  Arm laceration, left, subsequent encounter   Discharge Instructions   None    ED Prescriptions   None    PDMP not reviewed this encounter.   Vernestine Gondola, PA-C 03/16/24 1754

## 2024-08-11 ENCOUNTER — Encounter: Payer: Self-pay | Admitting: Emergency Medicine

## 2024-08-11 ENCOUNTER — Ambulatory Visit
Admission: EM | Admit: 2024-08-11 | Discharge: 2024-08-11 | Disposition: A | Discharge status: Home / Self Care | Payer: MEDICAID | Attending: Family Medicine | Admitting: Family Medicine

## 2024-08-11 DIAGNOSIS — J020 Streptococcal pharyngitis: Secondary | ICD-10-CM

## 2024-08-11 LAB — POC COVID19/FLU A&B COMBO
Covid Antigen, POC: NEGATIVE
Influenza A Antigen, POC: NEGATIVE
Influenza B Antigen, POC: NEGATIVE

## 2024-08-11 LAB — POCT RAPID STREP A (OFFICE): Rapid Strep A Screen: POSITIVE — AB

## 2024-08-11 MED ORDER — AMOXICILLIN 875 MG PO TABS: 875.0000 mg | ORAL_TABLET | Freq: Two times a day (BID) | ORAL | 0 refills | Status: AC

## 2024-08-11 NOTE — ED Provider Notes (Signed)
 [facility]   [id number] 08/11/24 Arrival Time: 0827  ASSESSMENT & PLAN:  1. Streptococcal sore throat    No signs of peritonsillar abscess. Work note provided. Begin: Meds ordered this encounter  Medications   amoxicillin  (AMOXIL ) 875 MG tablet    Sig: Take 1 tablet (875 mg total) by mouth 2 (two) times daily for 10 days.    Dispense:  20 tablet    Refill:  0  Tolerating PO.  Results for orders placed or performed during the hospital encounter of 08/11/24  POCT rapid strep A   Collection Time: 08/11/24  9:03 AM  Result Value Ref Range   Rapid Strep A Screen Positive (A) Negative  POC Covid19/Flu A&B Antigen   Collection Time: 08/11/24  9:09 AM  Result Value Ref Range   Influenza A Antigen, POC Negative Negative   Influenza B Antigen, POC Negative Negative   Covid Antigen, POC Negative Negative   Labs Reviewed  POCT RAPID STREP A (OFFICE) - Abnormal; Notable for the following components:      Result Value   Rapid Strep A Screen Positive (*)    All other components within normal limits  POC COVID19/FLU A&B COMBO - Normal    OTC analgesics and throat care as needed  Instructed to finish full 10 day course of antibiotics. Will follow up if not showing significant improvement over the next 24-48 hours.   Reviewed expectations re: course of current medical issues. Questions answered. Outlined signs and symptoms indicating need for more acute intervention. Patient verbalized understanding. After Visit Summary given.   SUBJECTIVE:  Claire Riley is a 38 y.o. female who reports a sore throat;abrupt onset; x 2 days. Tylenol  for low grade fever. Tolerating PO intake.   OBJECTIVE:  Vitals:   08/11/24 0851  BP: 127/79  Pulse: 86  Resp: 18  Temp: 98.7 F (37.1 C)  TempSrc: Oral  SpO2: 97%  Weight: 120.2 kg     General appearance: alert; no distress HEENT: throat with marked erythema and with exudative tonsillar hypertrophy; uvula is  midline Neck: supple with FROM; no lymphadenopathy Lungs: speaks full sentences without difficulty; unlabored Abd: soft; non-tender Skin: reveals no rash; warm and dry Psychological: alert and cooperative; normal mood and affect  Allergies  Allergen Reactions   Tomato Hives    Past Medical History:  Diagnosis Date   Anemia    Bronchial asthma    last attack 66yrs ago   Depression    Gonorrhea    Headache(784.0)    Pneumonia    Trichomonas    UTI (lower urinary tract infection)    Social History   Socioeconomic History   Marital status: Married    Spouse name: Not on file   Number of children: Not on file   Years of education: Not on file   Highest education level: Not on file  Occupational History    Employer: [employer]    Comment: [employer]  Tobacco Use   Smoking status: Some Days    Current packs/day: 0.25    Types: Cigarettes    Passive exposure: Current   Smokeless tobacco: Never  Vaping Use   Vaping status: Never Used  Substance and Sexual Activity   Alcohol use: No   Drug use: Not Currently    Types: Marijuana   Sexual activity: Yes    Birth control/protection: None  Other Topics Concern   Not on file  Social History Narrative   Not on file   Social  Drivers of Corporate investment banker Strain: Not on file  Food Insecurity: Food Insecurity Present (10/25/2023)   Hunger Vital Sign    Worried About Running Out of Food in the Last Year: Sometimes true    Ran Out of Food in the Last Year: Sometimes true  Transportation Needs: No Transportation Needs (10/25/2023)   PRAPARE - Administrator, Civil Service (Medical): No    Lack of Transportation (Non-Medical): No  Physical Activity: Not on file  Stress: Not on file  Social Connections: Not on file  Intimate Partner Violence: Not on file   Family History  Problem Relation Age of Onset   Hypertension Mother    Scoliosis Mother    Hypertension Father    Diabetes Father     COPD Father    Scoliosis Sister    Diabetes Paternal Jeannine Rolinda Rogue, MD 08/11/24 [phone]

## 2024-08-11 NOTE — ED Triage Notes (Signed)
 Pt presents c/o cough and sore throat x 2 days. Pt reports trying NyQuil and Tylenol  to help keep fever down. Pt denies emesis and diarrhea.

## 2024-08-15 ENCOUNTER — Ambulatory Visit: Payer: MEDICAID

## 2024-08-15 ENCOUNTER — Encounter: Payer: Self-pay | Admitting: Emergency Medicine

## 2024-08-15 ENCOUNTER — Ambulatory Visit
Admission: EM | Admit: 2024-08-15 | Discharge: 2024-08-15 | Disposition: A | Discharge status: Home / Self Care | Payer: MEDICAID

## 2024-08-15 DIAGNOSIS — T07XXXA Unspecified multiple injuries, initial encounter: Secondary | ICD-10-CM | POA: Diagnosis not present

## 2024-08-15 DIAGNOSIS — S41111A Laceration without foreign body of right upper arm, initial encounter: Secondary | ICD-10-CM | POA: Diagnosis not present

## 2024-08-15 MED ORDER — TETANUS-DIPHTH-ACELL PERTUSSIS 5-2-15.5 LF-MCG/0.5 IM SUSP: 0.5000 mL | Freq: Once | INTRAMUSCULAR | Status: DC

## 2024-08-15 MED ORDER — DOXYCYCLINE HYCLATE 100 MG PO CAPS: 100.0000 mg | ORAL_CAPSULE | Freq: Two times a day (BID) | ORAL | 0 refills | Status: AC

## 2024-08-15 NOTE — ED Notes (Signed)
 Immunizations  Name Dates Previously Given Next Due  HPV 9-valent 10/25/2023   Influenza-Unspecified 09/19/2019   Pneumococcal Polysaccharide-23 06/02/2013   Tdap 02/22/2024, 02/09/2013    From [facility] records
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
## 2024-08-15 NOTE — Discharge Instructions (Addendum)
 I recommend antibiotic ointment twice daily Change dressing at least once daily You can wash areas normally with mild soap and water - just don't scrub or soak!  Monitor for any signs of infection - increased pain, redness, swelling, or foul drainage. Please return if needed  I am putting you on doxycyline to prevent infection. Take twice daily for 7 days in a row. Still take and finish your amoxicillin  for the strep throat.   Please return in 7-10 days for removal of sutures

## 2024-08-15 NOTE — ED Provider Notes (Signed)
 [facility]    CSN: [id number] Arrival date & time: 08/15/24  9073      History   Chief Complaint Chief Complaint  Patient presents with   Multiple lacerations     HPI Claire Riley is a 38 y.o. female.  Last night around 8 pm (15 hours ago) she punched her right arm thorough a window. Reporting multiple cuts on the arm. Only two are open and were bleeding. She applied bandage at home. Not having numbness/tingling in the arm. 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 Authorizing Provider  acetaminophen  (TYLENOL ) 325 MG tablet Take 650 mg by mouth every 6 (six) hours as needed.   Yes [provider]  doxycycline (VIBRAMYCIN) 100 MG capsule Take 1 capsule (100 mg total) by mouth 2 (two) times daily for 7 days. 08/15/24 08/22/24 Yes Maxene Byington, Asberry, PA-C  albuterol  (VENTOLIN  HFA) 108 (90 Base) MCG/ACT inhaler Inhale 1-2 puffs into the lungs every 6 (six) hours as needed for wheezing or shortness of breath. 04/11/23   Myrna Camelia HERO, NP  amoxicillin  (AMOXIL ) 875 MG tablet Take 1 tablet (875 mg total) by mouth 2 (two) times daily for 10 days. 08/11/24 08/21/24  Rolinda Rogue,  MD  cetirizine -pseudoephedrine  (ZYRTEC -D) 5-120 MG tablet Take 1 tablet by mouth daily. 03/14/24   Robinson, John K, PA-C  Melatonin 10 MG TABS Take 10 mg by mouth at bedtime. 10/05/21   Nwoko, Uchenna E, PA  meloxicam  (MOBIC ) 15 MG tablet Take 1 tablet (15 mg total) by mouth daily. 02/22/24   Palumbo, April, MD    Family History Family History  Problem Relation Age of Onset   Hypertension Mother    Scoliosis Mother    Hypertension Father    Diabetes Father    COPD Father    Scoliosis Sister    Diabetes Paternal Grandmother     Social History Social History   Tobacco Use   Smoking status: Some Days    Current packs/day: 0.25    Types: Cigarettes    Passive exposure: Current   Smokeless tobacco: Never  Vaping Use   Vaping status: Never Used  Substance Use Topics   Alcohol use: No   Drug use: Not Currently    Types: Marijuana     Allergies   Tomato  Review of Systems Review of Systems As per HPI  Physical Exam Triage Vital Signs ED Triage Vitals  Encounter Vitals Group     BP 08/15/24 1003 122/80     Girls Systolic BP Percentile --      Girls Diastolic BP Percentile --      Boys Systolic BP Percentile --      Boys Diastolic BP Percentile --      Pulse Rate 08/15/24 1003 98     Resp 08/15/24 1003 18     Temp 08/15/24 1003 98.5 F (36.9 C)     Temp Source 08/15/24 1003 Oral     SpO2 08/15/24 1003 98 %     Weight 08/15/24 1002 264 lb 15.9 oz (120.2 kg)     Height --      Head Circumference --      Peak Flow --      Pain Score 08/15/24 1001 9     Pain Loc --      Pain Education --      Exclude from Growth Chart --    No data found.  Updated Vital Signs BP 122/80 (BP Location: Left Arm)   Pulse 98   Temp 98.5 F (36.9 C) (Oral)   Resp 18   Wt 264 lb 15.9 oz (120.2 kg)   LMP 07/27/2024 (Exact Date)   SpO2 98%   BMI 34.02 kg/m    Physical Exam Vitals and nursing note reviewed.  Constitutional:      General: She is not in acute distress.     Appearance: Normal appearance.  HENT:     Mouth/Throat:     Pharynx: Oropharynx is clear.  Cardiovascular:     Rate and Rhythm: Normal rate and regular rhythm.     Pulses: Normal pulses.     Heart sounds: Normal heart sounds.  Pulmonary:     Effort: Pulmonary effort is normal.     Breath sounds: Normal breath sounds.  Musculoskeletal:     Comments: Full ROM of upper extremity shoulder, elbow, wrist. Grip strength intact. Sensation intact distally. Cap refill < 2 seconds. Radial pulse 2+  Skin:    Findings: Abrasion, laceration and wound present.     Comments: Multiple abrasions on the right upper extremity. Two lacerations. One circular on the anterior bicep (4 cm long), one ovular on the medial bicep (6 cm long). See photos.   Neurological:     Mental Status: She is alert and oriented to person, place, and time.                  UC Treatments / Results  Labs (all labs ordered are listed, but only abnormal results are displayed) Labs Reviewed - No data to display  EKG  Radiology DG Elbow Complete Right Result Date: 08/15/2024 CLINICAL DATA:  Multiple lacerations over the last 24 hours. Evaluate for foreign body. EXAM: RIGHT ELBOW - COMPLETE 3+ VIEW COMPARISON:  None Available. FINDINGS: Bony structures and joint spaces are normal. No acute fracture or dislocation. No evidence of radiopaque foreign body. Soft tissues unremarkable. IMPRESSION: No acute findings. No radiopaque foreign body. Electronically Signed   By: Toribio Agreste M.D.   On: 08/15/2024 11:35   DG Forearm Right Result Date: 08/15/2024 CLINICAL DATA:  Multiple lacerations over the past 24 hours. EXAM: RIGHT FOREARM - 2 VIEW COMPARISON:  None Available. FINDINGS: No evidence of acute fracture or dislocation. No radiopaque foreign body. Remaining soft tissues are unremarkable. IMPRESSION: No acute  findings. Electronically Signed   By: Toribio Agreste M.D.   On: 08/15/2024 11:34    Procedures Laceration  Repair  Date/Time: 08/15/2024 1:29 PM  Performed by: Jeryl Stabs, PA-C Authorized by: Jeryl Stabs, PA-C   Consent:    Consent obtained:  Verbal   Consent given by:  Patient   Risks, benefits, and alternatives were discussed: yes     Risks discussed:  Infection, pain, retained foreign body, poor cosmetic result, need for additional repair, poor wound healing, vascular damage, tendon damage and nerve damage Universal protocol:    Procedure explained and questions answered to patient or proxy's satisfaction: yes     Patient identity confirmed:  Verbally with patient Anesthesia:    Anesthesia method:  Local infiltration   Local anesthetic:  Lidocaine  1% WITH epi Laceration details:    Location:  Shoulder/arm   Shoulder/arm location:  R upper arm   Length (cm):  3 Pre-procedure details:    Preparation:  Patient was prepped and draped in usual sterile fashion Exploration:    Hemostasis achieved with:  Direct pressure   Imaging obtained: x-ray     Imaging outcome: foreign body not noted     Wound exploration: wound explored through full range of motion and entire depth of wound visualized   Treatment:    Area cleansed with:  Shur-Clens, saline and chlorhexidine   Amount of cleaning:  Standard   Irrigation solution:  Sterile water Skin repair:    Repair method:  Sutures   Suture size:  3-0   Suture technique:  Simple interrupted   Number of sutures:  4 Approximation:    Approximation:  Close Repair type:    Repair type:  Simple Post-procedure details:    Dressing:  Non-adherent dressing   Procedure completion:  Tolerated well, no immediate complications Laceration Repair  Date/Time: 08/15/2024 1:37 PM  Performed by: Jeryl Stabs, PA-C Authorized by: Jeryl Stabs, PA-C   Laceration details:    Location:  Shoulder/arm   Shoulder/arm location:  R elbow Skin repair:    Repair method:  Sutures   Suture size:  4-0   Suture material:  Nylon   Suture technique:   Simple interrupted   Number of sutures:  5 Approximation:    Approximation:  Close  (including critical care time)  Medications Ordered in UC Medications - No data to display   Initial Impression / Assessment and Plan / UC Course  I have reviewed the triage vital signs and the nursing notes.  Pertinent labs & imaging results that were available during my care of the patient were reviewed by me and considered in my medical decision making (see chart for details).  Right arm with several abrasions, two large lacerations. See attached photos.  Plain films to assess for foreign body - negative.  Wounds are cleansed thoroughly. Lacerations closed as per procedure note.  Vicryl used for anterior bicep lac closure as both prolene and Ethilon broke (4 sutures). Ethilon used for medial bicep lac (5 sutures) Wound care and pain control at home are discussed. Signs of infection to monitor for.  Suture removal in 7-10 days  Doxycycline BID x 7 days for multiple wound prophylaxis  Agrees to plan, no questions at this time   Final Clinical Impressions(s) / UC Diagnoses   Final diagnoses:  Laceration of right upper arm without complication, initial encounter  Multiple abrasions     Discharge Instructions      I recommend antibiotic ointment twice daily Change dressing  at least once daily You can wash areas normally with mild soap and water - just don't scrub or soak!  Monitor for any signs of infection - increased pain, redness, swelling, or foul drainage. Please return if needed  I am putting you on doxycyline to prevent infection. Take twice daily for 7 days in a row. Still take and finish your amoxicillin  for the strep throat.   Please return in 7-10 days for removal of sutures      ED Prescriptions     Medication Sig Dispense Auth. Provider   doxycycline (VIBRAMYCIN) 100 MG capsule Take 1 capsule (100 mg total) by mouth 2 (two) times daily for 7 days. 14 capsule Izan Miron,  Asberry, PA-C      PDMP not reviewed this encounter.   Jeryl Asberry, PA-C 08/15/24 1345

## 2024-08-26 ENCOUNTER — Ambulatory Visit
Admission: EM | Admit: 2024-08-26 | Discharge: 2024-08-26 | Disposition: A | Discharge status: Home / Self Care | Payer: MEDICAID | Attending: Family Medicine | Admitting: Family Medicine

## 2024-08-26 DIAGNOSIS — Z4802 Encounter for removal of sutures: Secondary | ICD-10-CM

## 2024-08-26 NOTE — ED Provider Notes (Signed)
  [facility]   [id number] 08/26/24 Arrival Time: 9074  ASSESSMENT & PLAN:  1. Visit for suture removal    Removed by nursing staff. No complications.    Rolinda Rogue, MD 08/26/24 [phone]

## 2024-08-26 NOTE — ED Triage Notes (Signed)
 2 areas to have sutures removed. Right upper arm and inside of same arm. Same came untied per patient. No drainage. No redness. Not open at all. No concerns voiced from patient. No fever.
